# Patient Record
Sex: Female | Born: 1965
Health system: Southern US, Community
[De-identification: ages and names within clinical notes are randomized; demographics above are authoritative.]

## PROBLEM LIST (undated history)

## (undated) DIAGNOSIS — G43909 Migraine, unspecified, not intractable, without status migrainosus: Secondary | ICD-10-CM

## (undated) DIAGNOSIS — R519 Headache, unspecified: Secondary | ICD-10-CM

## (undated) DIAGNOSIS — Z720 Tobacco use: Secondary | ICD-10-CM

## (undated) DIAGNOSIS — R0600 Dyspnea, unspecified: Secondary | ICD-10-CM

## (undated) DIAGNOSIS — Z87442 Personal history of urinary calculi: Secondary | ICD-10-CM

## (undated) DIAGNOSIS — I209 Angina pectoris, unspecified: Secondary | ICD-10-CM

## (undated) DIAGNOSIS — F419 Anxiety disorder, unspecified: Secondary | ICD-10-CM

## (undated) DIAGNOSIS — R51 Headache: Secondary | ICD-10-CM

## (undated) DIAGNOSIS — E785 Hyperlipidemia, unspecified: Secondary | ICD-10-CM

## (undated) DIAGNOSIS — Z9289 Personal history of other medical treatment: Secondary | ICD-10-CM

## (undated) DIAGNOSIS — I214 Non-ST elevation (NSTEMI) myocardial infarction: Secondary | ICD-10-CM

## (undated) DIAGNOSIS — I2510 Atherosclerotic heart disease of native coronary artery without angina pectoris: Secondary | ICD-10-CM

## (undated) HISTORY — PX: DILATION AND CURETTAGE OF UTERUS: SHX78

## (undated) HISTORY — PX: TUBAL LIGATION: SHX77

## (undated) HISTORY — PX: ECTOPIC PREGNANCY SURGERY: SHX613

## (undated) HISTORY — PX: APPENDECTOMY: SHX54

## (undated) HISTORY — PX: INNER EAR SURGERY: SHX679

## (undated) HISTORY — PX: TONSILLECTOMY: SUR1361

---

## 1987-12-27 DIAGNOSIS — Z9289 Personal history of other medical treatment: Secondary | ICD-10-CM

## 1987-12-27 HISTORY — DX: Personal history of other medical treatment: Z92.89

## 2007-07-30 ENCOUNTER — Emergency Department (HOSPITAL_COMMUNITY): Admission: EM | Admit: 2007-07-30 | Discharge: 2007-07-31 | Payer: Self-pay | Admitting: Emergency Medicine

## 2007-08-31 ENCOUNTER — Emergency Department (HOSPITAL_COMMUNITY): Admission: EM | Admit: 2007-08-31 | Discharge: 2007-08-31 | Payer: Self-pay | Admitting: Emergency Medicine

## 2008-01-05 ENCOUNTER — Emergency Department (HOSPITAL_COMMUNITY): Admission: EM | Admit: 2008-01-05 | Discharge: 2008-01-05 | Payer: Self-pay | Admitting: Emergency Medicine

## 2008-05-24 ENCOUNTER — Emergency Department (HOSPITAL_COMMUNITY): Admission: EM | Admit: 2008-05-24 | Discharge: 2008-05-24 | Payer: Self-pay | Admitting: Emergency Medicine

## 2009-02-25 DIAGNOSIS — Z87442 Personal history of urinary calculi: Secondary | ICD-10-CM

## 2009-02-25 HISTORY — DX: Personal history of urinary calculi: Z87.442

## 2009-03-06 ENCOUNTER — Emergency Department (HOSPITAL_COMMUNITY): Admission: EM | Admit: 2009-03-06 | Discharge: 2009-03-06 | Payer: Self-pay | Admitting: Emergency Medicine

## 2009-05-15 ENCOUNTER — Emergency Department (HOSPITAL_COMMUNITY): Admission: EM | Admit: 2009-05-15 | Discharge: 2009-05-15 | Payer: Self-pay | Admitting: Emergency Medicine

## 2009-10-26 ENCOUNTER — Emergency Department (HOSPITAL_COMMUNITY): Admission: EM | Admit: 2009-10-26 | Discharge: 2009-10-26 | Payer: Self-pay | Admitting: Emergency Medicine

## 2010-05-13 LAB — DIFFERENTIAL
Basophils Relative: 1 % (ref 0–1)
Lymphs Abs: 2.1 10*3/uL (ref 0.7–4.0)
Monocytes Absolute: 0.6 10*3/uL (ref 0.1–1.0)
Neutrophils Relative %: 57 % (ref 43–77)

## 2010-05-13 LAB — COMPREHENSIVE METABOLIC PANEL
Calcium: 9 mg/dL (ref 8.4–10.5)
Chloride: 105 mEq/L (ref 96–112)
Creatinine, Ser: 0.73 mg/dL (ref 0.4–1.2)
GFR calc non Af Amer: >60 mL/min (ref >60)
Potassium: 3 mEq/L — ABNORMAL LOW (ref 3.5–5.1)
Sodium: 136 mEq/L (ref 135–145)

## 2010-05-13 LAB — CBC
HCT: 43.8 % (ref 36.0–46.0)
Hemoglobin: 14.7 g/dL (ref 12.0–15.0)
MCHC: 33.5 g/dL (ref 30.0–36.0)
RDW: 14 % (ref 11.5–15.5)
WBC: 6.7 10*3/uL (ref 4.0–10.5)

## 2010-05-13 LAB — POCT PREGNANCY, URINE: Preg Test, Ur: NEGATIVE

## 2010-06-07 LAB — URINALYSIS, ROUTINE W REFLEX MICROSCOPIC
Bilirubin Urine: NEGATIVE
Hgb urine dipstick: NEGATIVE
Nitrite: NEGATIVE

## 2010-06-07 LAB — URINE CULTURE

## 2010-06-07 LAB — ACETAMINOPHEN LEVEL: Acetaminophen (Tylenol), Serum: <10 ug/mL — ABNORMAL LOW (ref 10–30)

## 2010-06-07 LAB — RAPID URINE DRUG SCREEN, HOSP PERFORMED
Amphetamines: NOT DETECTED
Barbiturates: NOT DETECTED
Tetrahydrocannabinol: POSITIVE — AB

## 2010-06-07 LAB — PREGNANCY, URINE: Preg Test, Ur: NEGATIVE

## 2010-11-22 LAB — DIFFERENTIAL
Eosinophils Absolute: 0.1
Eosinophils Relative: 1
Lymphocytes Relative: 17
Monocytes Relative: 7
Neutro Abs: 8.5 — ABNORMAL HIGH
Neutrophils Relative %: 74

## 2010-11-22 LAB — URINE MICROSCOPIC-ADD ON

## 2010-11-22 LAB — COMPREHENSIVE METABOLIC PANEL
CO2: 20
Calcium: 8.7
Creatinine, Ser: 0.87
GFR calc Af Amer: >60
Total Bilirubin: 0.5

## 2010-11-22 LAB — URINALYSIS, ROUTINE W REFLEX MICROSCOPIC
Protein, ur: NEGATIVE
Specific Gravity, Urine: 1.014
Urobilinogen, UA: 1

## 2010-11-22 LAB — CBC
HCT: 45
Platelets: 262

## 2010-11-22 LAB — LIPASE, BLOOD: Lipase: 21

## 2013-06-13 ENCOUNTER — Emergency Department (HOSPITAL_COMMUNITY)
Admission: EM | Admit: 2013-06-13 | Discharge: 2013-06-13 | Disposition: A | Discharge status: Home / Self Care | Payer: Self-pay | Attending: Emergency Medicine | Admitting: Emergency Medicine

## 2013-06-13 ENCOUNTER — Encounter (HOSPITAL_COMMUNITY): Payer: Self-pay | Admitting: Emergency Medicine

## 2013-06-13 DIAGNOSIS — Y929 Unspecified place or not applicable: Secondary | ICD-10-CM | POA: Insufficient documentation

## 2013-06-13 DIAGNOSIS — S30860A Insect bite (nonvenomous) of lower back and pelvis, initial encounter: Secondary | ICD-10-CM | POA: Insufficient documentation

## 2013-06-13 DIAGNOSIS — H00019 Hordeolum externum unspecified eye, unspecified eyelid: Secondary | ICD-10-CM | POA: Insufficient documentation

## 2013-06-13 DIAGNOSIS — Y939 Activity, unspecified: Secondary | ICD-10-CM | POA: Insufficient documentation

## 2013-06-13 DIAGNOSIS — W57XXXA Bitten or stung by nonvenomous insect and other nonvenomous arthropods, initial encounter: Secondary | ICD-10-CM | POA: Insufficient documentation

## 2013-06-13 NOTE — Discharge Instructions (Signed)
Sty A sty (hordeolum) is an infection of a gland in the eyelid located at the base of the eyelash. A sty may develop a white or yellow head of pus. It can be puffy (swollen). Usually, the sty will burst and pus will come out on its own. They do not leave lumps in the eyelid once they drain. A sty is often confused with another form of cyst of the eyelid called a chalazion. Chalazions occur within the eyelid and not on the edge where the bases of the eyelashes are. They often are red, sore and then form firm lumps in the eyelid. CAUSES   Germs (bacteria).  Lasting (chronic) eyelid inflammation. SYMPTOMS   Tenderness, redness and swelling along the edge of the eyelid at the base of the eyelashes.  Sometimes, there is a white or yellow head of pus. It may or may not drain. DIAGNOSIS  An ophthalmologist will be able to distinguish between a sty and a chalazion and treat the condition appropriately.  TREATMENT   Styes are typically treated with warm packs (compresses) until drainage occurs.  In rare cases, medicines that kill germs (antibiotics) may be prescribed. These antibiotics may be in the form of drops, cream or pills.  If a hard lump has formed, it is generally necessary to do a small incision and remove the hardened contents of the cyst in a minor surgical procedure done in the office.  In suspicious cases, your caregiver may send the contents of the cyst to the lab to be certain that it is not a rare, but dangerous form of cancer of the glands of the eyelid. HOME CARE INSTRUCTIONS   Wash your hands often and dry them with a clean towel. Avoid touching your eyelid. This may spread the infection to other parts of the eye.  Apply heat to your eyelid for 10 to 20 minutes, several times a day, to ease pain and help to heal it faster.  Do not squeeze the sty. Allow it to drain on its own. Wash your eyelid carefully 3 to 4 times per day to remove any pus. SEEK IMMEDIATE MEDICAL CARE IF:    Your eye becomes painful or puffy (swollen).  Your vision changes.  Your sty does not drain by itself within 3 days.  Your sty comes back within a short period of time, even with treatment.  You have redness (inflammation) around the eye.  You have a fever. Document Released: 11/21/2004 Document Revised: 05/06/2011 Document Reviewed: 07/26/2008 [employer] Patient Information 2014 [employer].  

## 2013-06-13 NOTE — ED Provider Notes (Signed)
Medical screening examination/treatment/procedure(s) were performed by non-physician practitioner and as supervising physician I was immediately available for consultation/collaboration.   EKG Interpretation None        Tanna Furry, MD 06/13/13 [phone]

## 2013-06-13 NOTE — ED Provider Notes (Signed)
CSN: [id number]     Arrival date & time 06/13/13  1652 History   This chart was scribed for non-physician practitioner Glendell Docker, Terry, working with Tanna Furry, MD, by Neta Ehlers, ED Scribe. This patient was seen in room WTR5/WTR5 and the patient's care was started at 5:12 PM. First MD Initiated Contact with Patient 06/13/13 1702     Chief Complaint  Patient presents with  . Stye    The history is provided by the patient. No language interpreter was used.    HPI Comments: Rhonda Brown is a 48 y.o. female who presents to the Emergency Department complaining of a worsening stye to her right eyelid which she first noticed this morning. The pt rates the pain as 10/10. She has used three Zyrtec and cold compresses without response. The pt also reports she removed a tick from her back this morning.   History reviewed. No pertinent past medical history. Past Surgical History  Procedure Laterality Date  . Tonsillectomy    . Tubal ligation    . Appendectomy     History reviewed. No pertinent family history. History  Substance Use Topics  . Smoking status: Never Smoker   . Smokeless tobacco: Not on file  . Alcohol Use: No   No OB history provided.   Review of Systems  Eyes:       Stye to right eye with associated pain.   All other systems reviewed and are negative.  Allergies  Review of patient's allergies indicates not on file.  Home Medications   Prior to Admission medications   Not on File   Triage Vitals: BP 140/100  Pulse 94  Temp(Src) 98.2 F (36.8 C) (Oral)  Resp 16  SpO2 100%  Physical Exam  Nursing note and vitals reviewed. Constitutional: She is oriented to person, place, and time. She appears well-developed and well-nourished. No distress.  HENT:  Head: Normocephalic and atraumatic.  Eyes: Conjunctivae and EOM are normal. Pupils are equal, round, and reactive to light.  Redness and swelling noted to the upper lid.   Neck: Neck supple. No tracheal  deviation present.  Cardiovascular: Normal rate.   Pulmonary/Chest: Effort normal. No respiratory distress.  Musculoskeletal: Normal range of motion.  Neurological: She is alert and oriented to person, place, and time.  Skin: Skin is warm and dry.  Psychiatric: She has a normal mood and affect. Her behavior is normal.    ED Course  Procedures (including critical care time)  DIAGNOSTIC STUDIES: Oxygen Saturation is 100% on room air, normal by my interpretation.    COORDINATION OF CARE:  5:14 PM- Discussed treatment plan with patient, and the patient agreed to the plan. The plan includes the use of warm compresses.   Labs Review Labs Reviewed - No data to display  Imaging Review No results found.   EKG Interpretation None      MDM   Final diagnoses:  Stye    Discussed symptomatic treatment at home  I personally performed the services described in this documentation, which was scribed in my presence. The recorded information has been reviewed and is accurate.     Glendell Docker, NP 06/13/13 1717

## 2013-06-13 NOTE — ED Notes (Signed)
Pt c/o swelling to rt eyelid since this morning. Found tick on her back this morning also.

## 2014-02-11 DIAGNOSIS — F1290 Cannabis use, unspecified, uncomplicated: Secondary | ICD-10-CM | POA: Insufficient documentation

## 2014-02-21 ENCOUNTER — Other Ambulatory Visit: Payer: Self-pay

## 2014-02-21 DIAGNOSIS — Z1231 Encounter for screening mammogram for malignant neoplasm of breast: Secondary | ICD-10-CM

## 2014-03-04 ENCOUNTER — Ambulatory Visit: Payer: Self-pay

## 2014-03-04 ENCOUNTER — Ambulatory Visit
Admission: RE | Admit: 2014-03-04 | Discharge: 2014-03-04 | Disposition: A | Discharge status: Home / Self Care | Payer: PRIVATE HEALTH INSURANCE | Source: Ambulatory Visit

## 2014-03-04 DIAGNOSIS — Z1231 Encounter for screening mammogram for malignant neoplasm of breast: Secondary | ICD-10-CM

## 2014-12-25 ENCOUNTER — Encounter (HOSPITAL_COMMUNITY): Payer: Self-pay | Admitting: *Deleted

## 2014-12-25 ENCOUNTER — Emergency Department (HOSPITAL_COMMUNITY)
Admission: EM | Admit: 2014-12-25 | Discharge: 2014-12-25 | Disposition: A | Discharge status: Home / Self Care | Payer: PRIVATE HEALTH INSURANCE | Attending: Emergency Medicine | Admitting: Emergency Medicine

## 2014-12-25 DIAGNOSIS — G8929 Other chronic pain: Secondary | ICD-10-CM | POA: Insufficient documentation

## 2014-12-25 DIAGNOSIS — K5900 Constipation, unspecified: Secondary | ICD-10-CM | POA: Insufficient documentation

## 2014-12-25 DIAGNOSIS — R202 Paresthesia of skin: Secondary | ICD-10-CM | POA: Insufficient documentation

## 2014-12-25 DIAGNOSIS — M545 Low back pain: Secondary | ICD-10-CM | POA: Insufficient documentation

## 2014-12-25 DIAGNOSIS — M25511 Pain in right shoulder: Secondary | ICD-10-CM | POA: Insufficient documentation

## 2014-12-25 DIAGNOSIS — R200 Anesthesia of skin: Secondary | ICD-10-CM | POA: Insufficient documentation

## 2014-12-25 MED ORDER — MELOXICAM 7.5 MG PO TABS: 7.5000 mg | ORAL_TABLET | Freq: Every day | ORAL | Status: DC

## 2014-12-25 NOTE — ED Notes (Addendum)
Pt with hx of chronic back pain states woke up with R arm pain and numbness.  Pain increases with movement.  Pt is concerned b/c smoking pot is not relieving the pain.

## 2014-12-25 NOTE — Discharge Instructions (Signed)
Read the information below.  Use the prescribed medication as directed.  Please discuss all new medications with your pharmacist.  You may return to the Emergency Department at any time for worsening condition or any new symptoms that concern you.   If there is any possibility that you might be pregnant, please let your health care provider know and discuss this with the pharmacist to ensure medication safety.   If you develop fevers, loss of control of bowel or bladder, weakness or numbness in your legs, or are unable to walk, return to the ER for a recheck. If you develop uncontrolled pain, weakness or numbness of the extremity, severe discoloration of the skin, or you are unable to move your arm, return to the ER for a recheck.      Shoulder Pain The shoulder is the joint that connects your arms to your body. The bones that form the shoulder joint include the upper arm bone (humerus), the shoulder blade (scapula), and the collarbone (clavicle). The top of the humerus is shaped like a ball and fits into a rather flat socket on the scapula (glenoid cavity). A combination of muscles and strong, fibrous tissues that connect muscles to bones (tendons) support your shoulder joint and hold the ball in the socket. Small, fluid-filled sacs (bursae) are located in different areas of the joint. They act as cushions between the bones and the overlying soft tissues and help reduce friction between the gliding tendons and the bone as you move your arm. Your shoulder joint allows a wide range of motion in your arm. This range of motion allows you to do things like scratch your back or throw a ball. However, this range of motion also makes your shoulder more prone to pain from overuse and injury. Causes of shoulder pain can originate from both injury and overuse and usually can be grouped in the following four categories: 1. Redness, swelling, and pain (inflammation) of the tendon (tendinitis) or the bursae  (bursitis). 2. Instability, such as a dislocation of the joint. 3. Inflammation of the joint (arthritis). 4. Broken bone (fracture). HOME CARE INSTRUCTIONS  1. Apply ice to the sore area.  Put ice in a plastic bag.  Place a towel between your skin and the bag.  Leave the ice on for 15-20 minutes, 3-4 times per day for the first 2 days, or as directed by your health care provider. 2. Stop using cold packs if they do not help with the pain. 3. If you have a shoulder sling or immobilizer, wear it as long as your caregiver instructs. Only remove it to shower or bathe. Move your arm as little as possible, but keep your hand moving to prevent swelling. 4. Squeeze a soft ball or foam pad as much as possible to help prevent swelling. 5. Only take over-the-counter or prescription medicines for pain, discomfort, or fever as directed by your caregiver. SEEK MEDICAL CARE IF:  1. Your shoulder pain increases, or new pain develops in your arm, hand, or fingers. 2. Your hand or fingers become cold and numb. 3. Your pain is not relieved with medicines. SEEK IMMEDIATE MEDICAL CARE IF:  1. Your arm, hand, or fingers are numb or tingling. 2. Your arm, hand, or fingers are significantly swollen or turn white or blue. MAKE SURE YOU:  1. Understand these instructions. 2. Will watch your condition. 3. Will get help right away if you are not doing well or get worse.   This information is not intended  to replace advice given to you by your health care provider. Make sure you discuss any questions you have with your health care provider.   Document Released: 11/21/2004 Document Revised: 03/04/2014 Document Reviewed: 06/06/2014 Elsevier Interactive Patient Education 2016 Elsevier Inc.  Shoulder Range of Motion Exercises Shoulder range of motion (ROM) exercises are designed to keep the shoulder moving freely. They are often recommended for people who have shoulder pain. MOVEMENT EXERCISE When you are able,  do this exercise 5-6 days per week, or as told by your health care provider. Work toward doing 2 sets of 10 swings. Pendulum Exercise How To Do This Exercise Lying Down 5. Lie face-down on a bed with your abdomen close to the side of the bed. 6. Let your arm hang over the side of the bed. 7. Relax your shoulder, arm, and hand. 8. Slowly and gently swing your arm forward and back. Do not use your neck muscles to swing your arm. They should be relaxed. If you are struggling to swing your arm, have someone gently swing it for you. When you do this exercise for the first time, swing your arm at a 15 degree angle for 15 seconds, or swing your arm 10 times. As pain lessens over time, increase the angle of the swing to 30-45 degrees. 9. Repeat steps 1-4 with the other arm. How To Do This Exercise While Standing 6. Stand next to a sturdy chair or table and hold on to it with your hand.  Bend forward at the waist.  Bend your knees slightly.  Relax your other arm and let it hang limp.  Relax the shoulder blade of the arm that is hanging and let it drop.  While keeping your shoulder relaxed, use body motion to swing your arm in small circles. The first time you do this exercise, swing your arm for about 30 seconds or 10 times. When you do it next time, swing your arm for a little longer.  Stand up tall and relax.  Repeat steps 1-7, this time changing the direction of the circles. 7. Repeat steps 1-8 with the other arm. STRETCHING EXERCISES Do these exercises 3-4 times per day on 5-6 days per week or as told by your health care provider. Work toward holding the stretch for 20 seconds. Stretching Exercise 1 4. Lift your arm straight out in front of you. 5. Bend your arm 90 degrees at the elbow (right angle) so your forearm goes across your body and looks like the letter "L." 6. Use your other arm to gently pull the elbow forward and across your body. 7. Repeat steps 1-3 with the other  arm. Stretching Exercise 2 You will need a towel or rope for this exercise. 3. Bend one arm behind your back with the palm facing outward. 4. Hold a towel with your other hand. 5. Reach the arm that holds the towel above your head, and bend that arm at the elbow. Your wrist should be behind your neck. 6. Use your free hand to grab the free end of the towel. 7. With the higher hand, gently pull the towel up behind you. 8. With the lower hand, pull the towel down behind you. 9. Repeat steps 1-6 with the other arm. STRENGTHENING EXERCISES Do each of these exercises at four different times of day (sessions) every day or as told by your health care provider. To begin with, repeat each exercise 5 times (repetitions). Work toward doing 3 sets of 12 repetitions or as  told by your health care provider. Strengthening Exercise 1 You will need a light weight for this activity. As you grow stronger, you may use a heavier weight. 4. Standing with a weight in your hand, lift your arm straight out to the side until it is at the same height as your shoulder. 5. Bend your arm at 90 degrees so that your fingers are pointing to the ceiling. 6. Slowly raise your hand until your arm is straight up in the air. 7. Repeat steps 1-3 with the other arm. Strengthening Exercise 2 You will need a light weight for this activity. As you grow stronger, you may use a heavier weight. 1. Standing with a weight in your hand, gradually move your straight arm in an arc, starting at your side, then out in front of you, then straight up over your head. 2. Gradually move your other arm in an arc, starting at your side, then out in front of you, then straight up over your head. 3. Repeat steps 1-2 with the other arm. Strengthening Exercise 3 You will need an elastic band for this activity. As you grow stronger, gradually increase the size of the bands or increase the number of bands that you use at one time. 1. While standing, hold  an elastic band in one hand and raise that arm up in the air. 2. With your other hand, pull down the band until that hand is by your side. 3. Repeat steps 1-2 with the other arm.   This information is not intended to replace advice given to you by your health care provider. Make sure you discuss any questions you have with your health care provider.   Document Released: 11/10/2002 Document Revised: 06/28/2014 Document Reviewed: 02/07/2014 Elsevier Interactive Patient Education 2016 [employer].    Emergency Department Resource Guide 1) Find a Doctor and Pay Out of Pocket Although you won't have to find out who is covered by your insurance plan, it is a good idea to ask around and get recommendations. You will then need to call the office and see if the doctor you have chosen will accept you as a new patient and what types of options they offer for patients who are self-pay. Some doctors offer discounts or will set up payment plans for their patients who do not have insurance, but you will need to ask so you aren't surprised when you get to your appointment.  2) Contact Your Local Health Department Not all health departments have doctors that can see patients for sick visits, but many do, so it is worth a call to see if yours does. If you don't know where your local health department is, you can check in your phone book. The CDC also has a tool to help you locate your state's health department, and many state websites also have listings of all of their local health departments.  3) Find a [facility] If your illness is not likely to be very severe or complicated, you may want to try a walk in clinic. These are popping up all over the country in pharmacies, drugstores, and shopping centers. They're usually staffed by nurse practitioners or physician assistants that have been trained to treat common illnesses and complaints. They're usually fairly quick and inexpensive. However, if you have  serious medical issues or chronic medical problems, these are probably not your best option.  No Primary Care Doctor: - Call Health Connect at  [phone] - they can help you locate a primary  care doctor that  accepts your insurance, provides certain services, etc. - Physician Referral Service- [phone]  Chronic Pain Problems: Organization         Address  Phone   Notes  [facility]  [phone] Patients need to be referred by their primary care doctor.   Medication Assistance: Organization         Address  Phone   Notes  Tanina Barb [facility] Medication [facility] [address] [place], [place] [address] [phone] --Must be a resident of [facility] -- Must have NO insurance coverage whatsoever (no Medicaid/ Medicare, etc.) -- The pt. MUST have a primary care doctor that directs their care regularly and follows them in the community   MedAssist  [phone]   [employer]  [phone]    Agencies that provide inexpensive medical care: Organization         Address  Phone   Notes  [place]  [phone]   [facility]    [phone]   [facility] [place], [place] [address] [phone]   [place] [address], [place] [phone]   Planned Parenthood    [phone]   [facility]    [phone]   [place] and [address], [place] Phone:  [phone], Fax:  [phone] Hours of Operation:  9 am - 6 pm, M-F.  Also accepts Medicaid/Medicare and self-pay.  [facility] for [place], [address], [place] Phone: [phone], Fax: [phone]. Hours of Operation:  8:30 am - 5:30 pm, M-F.  Also accepts Medicaid and self-pay.  [facility] [place] [address] [place] Phone: [phone]   [place],  [place], [place] [phone], Ext. [phone] Mondays & Thursdays: 7-9 AM.  First 15 patients are seen on a first come, first serve basis.    [place] Providers:  Organization         Address  Phone   Notes  [facility] [address], [place] [phone] Also accepts self-pay patients.  [facility] [address], [place]  [phone]   [place], [address], [place] [phone]   [facility] [address] [place] [phone]   Lucianne Lei [address], [place]   [phone] Only accepts [place] Access [place] patients after they have their name applied to their card.   Self-Pay (no insurance) in [facility]:  Organization         Address  Phone   Notes  Sickle Cell Patients, [facility] [place] [phone]   [facility] Care [place] [phone]   [facility] [place]  [place], [place], [place] [phone]   [facility]/Dr. Osei-Bonsu  [address], [place] or [address], [place] [phone] Phone number for both [place] and [place] locations is the same.  [facility] and [facility] [address] [place] [phone]   [facility] [address], [place] or [address] [phone]  [facility] [phone], phone; [phone], fax Sees patients 1st and 3rd Saturday of every month.  Must not qualify for public or private insurance (i.e. Medicaid, Medicare, [place] Health Choice, Veterans' Benefits)  Household income should be no more than 200% of the poverty level The clinic cannot treat you if you are pregnant or think you are pregnant   Sexually transmitted diseases are not treated at the clinic.    Dental Care: Organization         Address  Phone  Notes  [facility] Department of [facility] [phone] Accepts children up to age 81 who are enrolled in [place] or [place]; pregnant women with a Medicaid card; and children who have applied for Medicaid or [place] Health Choice, but were declined, whose parents can pay a reduced fee at time of service.  [facility] Department of [facility]  [address], [place] [phone] Accepts children up to age 63 who are enrolled in [place] or [place]; pregnant women with a Medicaid card; and children who have applied for Medicaid or [place] Health Choice, but were declined, whose parents can pay a reduced fee at time of service.  [place] Adult Dental Access PROGRAM  [place] [phone] Patients are seen by appointment only. Walk-ins are not accepted. [place] will see patients 37 years of age and older. Monday - Tuesday (8am-5pm) Most Wednesdays (8:30-5pm) $30 per visit, cash only  [facility] Adult Dental Access PROGRAM  [address], [facility] [phone] Patients are seen by appointment only. Walk-ins are not accepted. [place] will see patients 17 years of age and older. One Wednesday Evening (Monthly: Volunteer Based).  $30 per visit, cash only  [place]  [phone] for adults; Children under age 63, call Graduate Pediatric Dentistry at [phone]. Children aged 41-14, please call [phone] to request a pediatric application.  Dental services are provided in all areas of dental care including fillings, crowns and bridges, complete and partial dentures, implants, gum treatment, root canals, and extractions. Preventive care is also provided. Treatment is provided to both adults and children. Patients  are selected via a lottery and there is often a waiting list.   [facility] [address] [place]  [phone] [email or web address]   [facility] [address] [place], [place] [phone], Ext. [phone] Second and Fourth Thursday of each month, opens at 6:30 AM; Clinic ends at 9 AM.  Patients are seen on a first-come first-served basis, and a limited number are seen during each clinic.   [facility]  [address] Hillard Danker [place], [place] [phone]   Eligibility Requirements You must have lived in [place], [place], or [place] counties for at least the last three months.   You cannot be eligible for state or federal sponsored [employer], including [employer], [place], or [employer].   You generally cannot be eligible for healthcare insurance through your employer.    How to apply: Eligibility screenings are held every Tuesday and Wednesday afternoon from 1:00 pm until 4:00 pm. You do not need an appointment for the interview!  [facility] [address], [place], [place]   [place]  [place] Department  [place]  Department  [phone]    Behavioral Health Resources in the Community: Intensive Outpatient Programs Organization         Address  Phone  Notes  [place]. [address] [place], [place] [phone]   [facility] Outpatient [address], [place], [place]   ADS: Alcohol & Drug Svcs [address], [place], [place]   [place] [address]  [place], [place] or [phone]   Substance Abuse Resources Organization         Address  Phone  Notes  Alcohol and Drug Services  [phone]   [place]  [phone]   [facility] [place]   Chinita Pester  [phone]    Residential & Outpatient Substance Abuse Program  [phone]   Psychological Services Organization         Address  Phone  Notes  [facility] [place]  [place]  [phone]   [place] [address] [place] or [phone]    Mobile Crisis Teams Organization         Address  Phone  Notes  [facility]atives, Mobile Crisis Care Unit  [phone]   Assertive Psychotherapeutic Services  [address] [place], [place]   [place] [address], [place] [phone]    Self-Help/Support Groups Organization         Address  Phone             Notes  [place]. of [place] - variety of support groups  [place] Call for more information  Narcotics Anonymous (NA), Caring Services [address], [employer] [place]  2 meetings at this location   Special educational needs teacher         Address  Phone  Notes  ASAP Residential Treatment [place],    [place]  [phone]   [facility]  [address], [place] [address], [place], [place]   [place], [place] [phone] Admissions: 8am-3pm M-F  Incentives Substance [place] [address],    [place], [place] [phone]   [facility] [address] [place], [place], [place]   [facility] [address],  [place], [place]   Insight Programs - Intensive Outpatient [address], [place], [place]   [facility] ([place].,  [place], [place] [phone] or [phone]   [facility]idential Treatment Services (RTS) [address] [place], [place] Accepts Medicaid  [facility] [place] [address],  [place] [phone] Substance Abuse/Addiction Treatment   [facility] Organization         Address  Phone  Notes  [facility]  [phone]   Domenic Schwab, PhD [address] Arlis Port[place], [place]   [phone] or [phone]   [place] [facility], [place] [phone]   [place] [address] [place], [place] [phone] Insurance/Medicaid/sponsorship through [employer] and Families [address], [place]                                    [place], [place] [phone] Therapy/tele-psych/case  Youth  [facility] [place], [place] [phone]    Dr. Adele Schilder  [phone]   [facility] [place] Dept. 1) [address], [place] 2) [place] 3)  [place] [address], [place] [phone]  [phone]   [place] [phone] or [phone] (After Hours)

## 2014-12-25 NOTE — ED Notes (Signed)
Declined W/C at D/C and was escorted to lobby by RN. 

## 2014-12-25 NOTE — ED Provider Notes (Signed)
CSN: [id number]     Arrival date & time 12/25/14  1111 History  By signing my name below, I, Clement Sayres, attest that this documentation has been prepared under the direction and in the presence of Darald Uzzle, PA-C.  Electronically Signed: Clement Sayres, ED Scribe. 12/25/2014. 12:56 PM.    Chief Complaint  Patient presents with  . Arm Pain   The history is provided by the patient. No language interpreter was used.   HPI Comments: Rhonda Brown is a 49 y.o. female who presents to the Emergency Department complaining of constant, moderate right shoulder pain that began yesterday and has gradually been worsening.  She states she helped her friend do a significant amount of cleaning yesterday.  Pt also reports tingling and paresthesia in all of the fingers of her right hand.  Patient denies any other injury or fall.   Pt denies fevers, neck pain, bowel/bladder incontinence, lower extremity numbness/weakness, abdominal pain, SOB, dysuria.  She also complains of slight increase in her chronic, non-radiating, bilateral lower back pain onset 2 days ago that is worse with movement.  She describes this episode as like a typical flare of her back pain that is relieved by laying supine, rest, and ibuprofen. She is able to ambulate normally.  She denies fall, injury.     History reviewed. No pertinent past medical history. Past Surgical History  Procedure Laterality Date  . Tonsillectomy    . Tubal ligation    . Appendectomy     No family history on file. Social History  Substance Use Topics  . Smoking status: Never Smoker   . Smokeless tobacco: None  . Alcohol Use: No   OB History    No data available     Review of Systems  Constitutional: Negative for fever.  Respiratory: Negative for shortness of breath.   Cardiovascular: Negative for chest pain.  Gastrointestinal: Positive for constipation. Negative for vomiting and abdominal pain.  Genitourinary: Negative for dysuria, urgency  and frequency.  Musculoskeletal: Positive for back pain and arthralgias. Negative for neck pain.  Skin: Negative for color change, rash and wound.  Allergic/Immunologic: Negative for immunocompromised state.  Neurological: Positive for numbness. Negative for weakness.  Psychiatric/Behavioral: Negative for self-injury.      Allergies  Review of patient's allergies indicates no known allergies.  Home Medications   Prior to Admission medications   Medication Sig Start Date End Date Taking? Authorizing Provider  meloxicam (MOBIC) 7.5 MG tablet Take 1 tablet (7.5 mg total) by mouth daily. 12/25/14   Clayton Bibles, PA-C   BP 134/88 mmHg  Pulse 80  Temp(Src) 97.6 F (36.4 C) (Oral)  Resp 16  Ht 5\' 2"  (1.575 m)  Wt 173 lb 6.4 oz (78.654 kg)  BMI 31.71 kg/m2  SpO2 95% Physical Exam  Constitutional: She appears well-developed and well-nourished. No distress.  HENT:  Head: Normocephalic and atraumatic.  Neck: Neck supple.  Pulmonary/Chest: Effort normal.  Abdominal: Soft. She exhibits no distension and no mass. There is no tenderness. There is no rebound and no guarding.  Musculoskeletal:  Spine nontender, no crepitus, or stepoffs. Extremities:  Strength 5/5, sensation intact, distal pulses intact.     Some tenderness throughout lower back without focal tenderness or overlying skin changes. Right shoulder mild tenderness to the anterior shoulder full active ROM, passive ROM without significant pain Negative drop test No A/C joint tenderness  Neurological: She is alert.  Skin: She is not diaphoretic.  Nursing note and vitals reviewed.  ED Course  Procedures DIAGNOSTIC STUDIES: Oxygen Saturation is 100% on RA, normal by my interpretation.    12:56 PM COORDINATION OF CARE: Discussed treatment of referral and using a sling with pt. Pt agreed to plan.   Labs Review Labs Reviewed - No data to display  Imaging Review No results found. I have personally reviewed and evaluated  these images and lab results as part of my medical decision-making.   EKG Interpretation None      MDM   Final diagnoses:  Right shoulder pain    Afebrile, nontoxic patient with right shoulder pain after cleaning a friend's house of the weekend.  Neurovascularly intact.  Notes tingling in all fingers but this is not dermatomal.  No weakness.  No neck pain.  No red flags.  D/C home with mobic, PCP follow up, range of motion exercises.  Discussed result, findings, treatment, and follow up  with patient.  Pt given return precautions.  Pt verbalizes understanding and agrees with plan.         I personally performed the services described in this documentation, which was scribed in my presence. The recorded information has been reviewed and is accurate.   [place], PA-C 12/25/14 [place] Liu, MD 12/25/14 [phone]

## 2015-03-29 ENCOUNTER — Emergency Department (HOSPITAL_COMMUNITY)
Admission: EM | Admit: 2015-03-29 | Discharge: 2015-03-29 | Disposition: A | Discharge status: Home / Self Care | Payer: PRIVATE HEALTH INSURANCE | Attending: Emergency Medicine | Admitting: Emergency Medicine

## 2015-03-29 ENCOUNTER — Emergency Department (HOSPITAL_COMMUNITY): Payer: PRIVATE HEALTH INSURANCE

## 2015-03-29 ENCOUNTER — Encounter (HOSPITAL_COMMUNITY): Payer: Self-pay | Admitting: *Deleted

## 2015-03-29 DIAGNOSIS — L600 Ingrowing nail: Secondary | ICD-10-CM | POA: Insufficient documentation

## 2015-03-29 DIAGNOSIS — M79674 Pain in right toe(s): Secondary | ICD-10-CM | POA: Insufficient documentation

## 2015-03-29 DIAGNOSIS — Z791 Long term (current) use of non-steroidal anti-inflammatories (NSAID): Secondary | ICD-10-CM | POA: Insufficient documentation

## 2015-03-29 MED ORDER — NAPROXEN 250 MG PO TABS: 250.0000 mg | ORAL_TABLET | Freq: Two times a day (BID) | ORAL | Status: DC

## 2015-03-29 NOTE — ED Provider Notes (Signed)
CSN: [id number]     Arrival date & time 03/29/15  2054 History  By signing my name below, I, Rowan Blase, attest that this documentation has been prepared under the direction and in the presence of non-physician practitioner, Waynetta Pean, PA-C. Electronically Signed: Rowan Blase, Scribe. 03/29/2015. 9:46 PM.   Chief Complaint  Patient presents with  . Toe Pain   The history is provided by the patient. No language interpreter was used.   HPI Comments:  Maycee Macgregor is a 50 y.o. female who presents to the Emergency Department complaining of 7/10, non-radiating right big toe pain for the past month. Pt reports associated swelling and bruising under her toenail. She believes she had injury about 1 month ago, but is unsure.  Pt has attempted treatment with neosporin and epsom salt baths with mild relief. Pt notes she drinks ETOH on weekends and denies hx of gout. Pt denies fever, discharge, redness, weakness, numbness, or tingling.  History reviewed. No pertinent past medical history. Past Surgical History  Procedure Laterality Date  . Tonsillectomy    . Tubal ligation    . Appendectomy     No family history on file. Social History  Substance Use Topics  . Smoking status: Never Smoker   . Smokeless tobacco: None  . Alcohol Use: No   OB History    No data available     Review of Systems  Constitutional: Negative for fever.  Musculoskeletal: Positive for joint swelling and arthralgias.  Neurological: Negative for weakness and numbness.   Allergies  Review of patient's allergies indicates no known allergies.  Home Medications   Prior to Admission medications   Medication Sig Start Date End Date Taking? Authorizing Provider  meloxicam (MOBIC) 7.5 MG tablet Take 1 tablet (7.5 mg total) by mouth daily. 12/25/14   Clayton Bibles, PA-C  naproxen (NAPROSYN) 250 MG tablet Take 1 tablet (250 mg total) by mouth 2 (two) times daily with a meal. 03/29/15   Waynetta Pean, PA-C   BP  123/87 mmHg  Pulse 87  Temp(Src) 97.5 F (36.4 C) (Oral)  Resp 18  Ht 5\' 2"  (1.575 m)  Wt 74.844 kg  BMI 30.17 kg/m2  SpO2 98% Physical Exam  Constitutional: She appears well-developed and well-nourished. No distress.   Nontoxic appearing.  HENT:  Head: Normocephalic and atraumatic.  Eyes: Right eye exhibits no discharge. Left eye exhibits no discharge.  Cardiovascular: Normal rate, regular rhythm and intact distal pulses.   Pulses:      Dorsalis pedis pulses are 2+ on the right side, and 2+ on the left side.       Posterior tibial pulses are 2+ on the right side, and 2+ on the left side.   Good capillary refill to her right distal toes.  Pulmonary/Chest: Effort normal. No respiratory distress.  Musculoskeletal:  No calf edema, no knee tenderness; Mild edema and tenderness with movement of right big toe, no ecchymosis, no deformity  Neurological: She is alert. Coordination normal.  Skin: Skin is warm and dry. No rash noted. She is not diaphoretic. No erythema. No pallor.  Tenderness to her right great toe. She has ingrown nail without erythema. No evidence of a felon or paronychia.   Psychiatric: She has a normal mood and affect. Her behavior is normal.  Nursing note and vitals reviewed.   ED Course  Procedures DIAGNOSTIC STUDIES:  Oxygen Saturation is 98% on RA, normal by my interpretation.    COORDINATION OF CARE:  9:10 PM Will order  imaging of right great toe. Discussed treatment plan with pt at bedside and pt agreed to plan.  Labs Review Labs Reviewed - No data to display  Imaging Review Dg Toe Great Right  03/29/2015  CLINICAL DATA:  Right great toe pain and swelling for 1 month. EXAM: RIGHT GREAT TOE COMPARISON:  None. FINDINGS: No fracture or bony destructive findings to suggest osteomyelitis. No malalignment. No gas is evident in the soft tissues. Mild irregularity along the anterior process of the calcaneus in a pattern suspicious for fibrous calcaneonavicular  tarsal coalition. Plantar calcaneal spur. IMPRESSION: 1. No findings of osteomyelitis, fracture, or dislocation. No gas tracking in the soft tissues the great toe. 2. Suspicion for fibrous calcaneonavicular tarsal coalition. 3. Plantar calcaneal spur. Electronically Signed   By: Van Clines M.D.   On: 03/29/2015 21:37   I have personally reviewed and evaluated these images as part of my medical decision-making.   EKG Interpretation None      Filed Vitals:   03/29/15 2102  BP: 123/87  Pulse: 87  Temp: 97.5 F (36.4 C)  TempSrc: Oral  Resp: 18  Height: 5\' 2"  (1.575 m)  Weight: 74.844 kg  SpO2: 98%    MDM   Meds given in ED:  Medications - No data to display  New Prescriptions   NAPROXEN (NAPROSYN) 250 MG TABLET    Take 1 tablet (250 mg total) by mouth 2 (two) times daily with a meal.    Final diagnoses:  Great toe pain, right   This is a 50 y.o. female who presents to the Emergency Department complaining of 7/10, non-radiating right big toe pain for the past month. Pt reports associated swelling and bruising under her toenail. She believes she had injury about 1 month ago, but is unsure.  Pt has attempted treatment with neosporin and epsom salt baths with mild relief.   on exam the patient is afebrile and nontoxic appearing. The patient has tenderness diffusely throughout her right great toe. Mild edema. No erythema. She has ingrown nail without erythema. I doubt this is the cause of her pain is her pain is diffuse throughout her toe. No evidence of felon or paronychia.  X-ray is unremarkable of her great toe. We will discharge with Naprosyn and have her follow-up with podiatry and PCP. I advised the patient to follow-up with their primary care provider this week. I advised the patient to return to the emergency department with new or worsening symptoms or new concerns. The patient verbalized understanding and agreement with plan.    I personally performed the services  described in this documentation, which was scribed in my presence. The recorded information has been reviewed and is accurate.       Waynetta Pean, PA-C [phone]  Delora Fuel, MD [id number] [phone]

## 2015-03-29 NOTE — ED Notes (Signed)
Rt great toe pain for one month  She has blood under the toenail. .  No known injury

## 2015-03-29 NOTE — Discharge Instructions (Signed)
Ingrown Toenail  An ingrown toenail occurs when the corner or sides of your toenail grow into the surrounding skin. The big toe is most commonly affected, but it can happen to any of your toes. If your ingrown toenail is not treated, you will be at risk for infection.  CAUSES  This condition may be caused by:  · Wearing shoes that are too small or tight.  · Injury or trauma, such as stubbing your toe or having your toe stepped on.  · Improper cutting or care of your toenails.  · Being born with (congenital) nail or foot abnormalities, such as having a nail that is too big for your toe.  RISK FACTORS  Risk factors for an ingrown toenail include:  · Age. Your nails tend to thicken as you get older, so ingrown nails are more common in older people.  · Diabetes.  · Cutting your toenails incorrectly.  · Blood circulation problems.  SYMPTOMS  Symptoms may include:  · Pain, soreness, or tenderness.  · Redness.  · Swelling.  · Hardening of the skin surrounding the toe.  Your ingrown toenail may be infected if there is fluid, pus, or drainage.  DIAGNOSIS   An ingrown toenail may be diagnosed by medical history and physical exam. If your toenail is infected, your health care provider may test a sample of the drainage.  TREATMENT  Treatment depends on the severity of your ingrown toenail. Some ingrown toenails may be treated at home. More severe or infected ingrown toenails may require surgery to remove all or part of the nail. Infected ingrown toenails may also be treated with antibiotic medicines.  HOME CARE INSTRUCTIONS  · If you were prescribed an antibiotic medicine, finish all of it even if you start to feel better.  · Soak your foot in warm soapy water for 20 minutes, 3 times per day or as directed by your health care provider.  · Carefully lift the edge of the nail away from the sore skin by wedging a small piece of cotton under the corner of the nail. This may help with the pain.  Be careful not to cause more injury  to the area.  · Wear shoes that fit well. If your ingrown toenail is causing you pain, try wearing sandals, if possible.  · Trim your toenails regularly and carefully. Do not cut them in a curved shape. Cut your toenails straight across. This prevents injury to the skin at the corners of the toenail.  · Keep your feet clean and dry.  · If you are having trouble walking and are given crutches by your health care provider, use them as directed.  · Do not pick at your toenail or try to remove it yourself.  · Take medicines only as directed by your health care provider.  · Keep all follow-up visits as directed by your health care provider. This is important.  SEEK MEDICAL CARE IF:  · Your symptoms do not improve with treatment.  SEEK IMMEDIATE MEDICAL CARE IF:  · You have red streaks that start at your foot and go up your leg.  · You have a fever.  · You have increased redness, swelling, or pain.  · You have fluid, blood, or pus coming from your toenail.     This information is not intended to replace advice given to you by your health care provider. Make sure you discuss any questions you have with your health care provider.     Document Released:   02/09/2000 Document Revised: 06/28/2014 Document Reviewed: 01/05/2014  Elsevier Interactive Patient Education ©2016 [employer]

## 2015-05-24 ENCOUNTER — Other Ambulatory Visit: Payer: Self-pay | Admitting: Family

## 2015-05-24 DIAGNOSIS — Z1231 Encounter for screening mammogram for malignant neoplasm of breast: Secondary | ICD-10-CM

## 2015-05-31 ENCOUNTER — Ambulatory Visit: Payer: PRIVATE HEALTH INSURANCE

## 2015-06-20 ENCOUNTER — Ambulatory Visit
Admission: RE | Admit: 2015-06-20 | Discharge: 2015-06-20 | Disposition: A | Discharge status: Home / Self Care | Payer: PRIVATE HEALTH INSURANCE | Source: Ambulatory Visit | Attending: Family | Admitting: Family

## 2015-06-20 DIAGNOSIS — Z1231 Encounter for screening mammogram for malignant neoplasm of breast: Secondary | ICD-10-CM

## 2015-06-22 ENCOUNTER — Other Ambulatory Visit: Payer: Self-pay | Admitting: Family

## 2015-06-22 DIAGNOSIS — R928 Other abnormal and inconclusive findings on diagnostic imaging of breast: Secondary | ICD-10-CM

## 2015-06-28 ENCOUNTER — Ambulatory Visit
Admission: RE | Admit: 2015-06-28 | Discharge: 2015-06-28 | Disposition: A | Discharge status: Home / Self Care | Payer: PRIVATE HEALTH INSURANCE | Source: Ambulatory Visit | Attending: Family | Admitting: Family

## 2015-06-28 DIAGNOSIS — R928 Other abnormal and inconclusive findings on diagnostic imaging of breast: Secondary | ICD-10-CM

## 2015-07-03 ENCOUNTER — Encounter (HOSPITAL_COMMUNITY): Payer: Self-pay

## 2015-07-03 ENCOUNTER — Emergency Department (HOSPITAL_COMMUNITY)
Admission: EM | Admit: 2015-07-03 | Discharge: 2015-07-03 | Disposition: A | Discharge status: Home / Self Care | Payer: PRIVATE HEALTH INSURANCE | Attending: Emergency Medicine | Admitting: Emergency Medicine

## 2015-07-03 DIAGNOSIS — M546 Pain in thoracic spine: Secondary | ICD-10-CM | POA: Diagnosis present

## 2015-07-03 DIAGNOSIS — Z79899 Other long term (current) drug therapy: Secondary | ICD-10-CM | POA: Insufficient documentation

## 2015-07-03 LAB — URINALYSIS, ROUTINE W REFLEX MICROSCOPIC
Bilirubin Urine: NEGATIVE
GLUCOSE, UA: NEGATIVE mg/dL
HGB URINE DIPSTICK: NEGATIVE
Ketones, ur: NEGATIVE mg/dL
Leukocytes, UA: NEGATIVE
Nitrite: NEGATIVE
PROTEIN: NEGATIVE mg/dL
Specific Gravity, Urine: 1.028 (ref 1.005–1.030)
pH: 5.5 (ref 5.0–8.0)

## 2015-07-03 MED ORDER — DIAZEPAM 5 MG PO TABS
5.0000 mg | ORAL_TABLET | Freq: Once | ORAL | Status: AC
  Administered 2015-07-03: 5 mg via ORAL
  Filled 2015-07-03: qty 1

## 2015-07-03 MED ORDER — KETOROLAC TROMETHAMINE 60 MG/2ML IM SOLN
60.0000 mg | Freq: Once | INTRAMUSCULAR | Status: AC
  Administered 2015-07-03: 60 mg via INTRAMUSCULAR
  Filled 2015-07-03: qty 2

## 2015-07-03 MED ORDER — OXYCODONE HCL 5 MG PO TABS
5.0000 mg | ORAL_TABLET | Freq: Once | ORAL | Status: AC
  Administered 2015-07-03: 5 mg via ORAL
  Filled 2015-07-03: qty 1

## 2015-07-03 NOTE — Discharge Instructions (Signed)
Take 4 over the counter ibuprofen tablets 3 times a day or 2 over-the-counter naproxen tablets twice a day for pain. ° °Back Pain, Adult °Back pain is very common in adults. The cause of back pain is rarely dangerous and the pain often gets better over time. The cause of your back pain may not be known. Some common causes of back pain include: °· Strain of the muscles or ligaments supporting the spine. °· Wear and tear (degeneration) of the spinal disks. °· Arthritis. °· Direct injury to the back. °For many people, back pain may return. Since back pain is rarely dangerous, most people can learn to manage this condition on their own. °HOME CARE INSTRUCTIONS °Watch your back pain for any changes. The following actions may help to lessen any discomfort you are feeling: °· Remain active. It is stressful on your back to sit or stand in one place for long periods of time. Do not sit, drive, or stand in one place for more than 30 minutes at a time. Take short walks on even surfaces as soon as you are able. Try to increase the length of time you walk each day. °· Exercise regularly as directed by your health care provider. Exercise helps your back heal faster. It also helps avoid future injury by keeping your muscles strong and flexible. °· Do not stay in bed. Resting more than 1-2 days can delay your recovery. °· Pay attention to your body when you bend and lift. The most comfortable positions are those that put less stress on your recovering back. Always use proper lifting techniques, including: °¨ Bending your knees. °¨ Keeping the load close to your body. °¨ Avoiding twisting. °· Find a comfortable position to sleep. Use a firm mattress and lie on your side with your knees slightly bent. If you lie on your back, put a pillow under your knees. °· Avoid feeling anxious or stressed. Stress increases muscle tension and can worsen back pain. It is important to recognize when you are anxious or stressed and learn ways to  manage it, such as with exercise. °· Take medicines only as directed by your health care provider. Over-the-counter medicines to reduce pain and inflammation are often the most helpful. Your health care provider may prescribe muscle relaxant drugs. These medicines help dull your pain so you can more quickly return to your normal activities and healthy exercise. °· Apply ice to the injured area: °¨ Put ice in a plastic bag. °¨ Place a towel between your skin and the bag. °¨ Leave the ice on for 20 minutes, 2-3 times a day for the first 2-3 days. After that, ice and heat may be alternated to reduce pain and spasms. °· Maintain a healthy weight. Excess weight puts extra stress on your back and makes it difficult to maintain good posture. °SEEK MEDICAL CARE IF: °· You have pain that is not relieved with rest or medicine. °· You have increasing pain going down into the legs or buttocks. °· You have pain that does not improve in one week. °· You have night pain. °· You lose weight. °· You have a fever or chills. °SEEK IMMEDIATE MEDICAL CARE IF:  °· You develop new bowel or bladder control problems. °· You have unusual weakness or numbness in your arms or legs. °· You develop nausea or vomiting. °· You develop abdominal pain. °· You feel faint. °  °This information is not intended to replace advice given to you by your health care provider. Make sure you discuss any questions you have   with your health care provider. °  °Document Released: 02/11/2005 Document Revised: 03/04/2014 Document Reviewed: 06/15/2013 °Elsevier Interactive Patient Education ©2016 [employer]. ° °

## 2015-07-03 NOTE — ED Provider Notes (Signed)
CSN: [id number]     Arrival date & time 07/03/15  0214 History  By signing my name below, I, Evelene Croon, attest that this documentation has been prepared under the direction and in the presence of Deno Etienne, DO . Electronically Signed: Evelene Croon, Scribe. 07/03/2015. 3:11 AM.    Chief Complaint  Patient presents with  . Back Pain    The history is provided by the patient. No language interpreter was used.     HPI Comments:  Rhonda Brown is a 50 y.o. female who presents to the Emergency Department complaining of gradually worsening, non-radiating, back pain which she woke up with today. She has applied Bengay and taken ibuprofen and flexeril with little relief. Pt reports h/o same pain. She denies recent injury, urinary symptoms, and bowel/bladder incontinence.    History reviewed. No pertinent past medical history. Past Surgical History  Procedure Laterality Date  . Tonsillectomy    . Tubal ligation    . Appendectomy     No family history on file. Social History  Substance Use Topics  . Smoking status: Never Smoker   . Smokeless tobacco: None  . Alcohol Use: No   OB History    No data available     Review of Systems  Constitutional: Negative for fever and chills.  HENT: Negative for congestion and rhinorrhea.   Eyes: Negative for redness and visual disturbance.  Respiratory: Negative for shortness of breath and wheezing.   Cardiovascular: Negative for chest pain and palpitations.  Gastrointestinal: Negative for nausea and vomiting.  Genitourinary: Negative for dysuria and urgency.  Musculoskeletal: Positive for back pain. Negative for myalgias and arthralgias.  Skin: Negative for pallor and wound.  Neurological: Negative for dizziness and headaches.   Allergies  Review of patient's allergies indicates no known allergies.  Home Medications   Prior to Admission medications   Medication Sig Start Date End Date Taking? Authorizing Provider  PRESCRIPTION  MEDICATION Take 1 capsule by mouth 2 (two) times daily.   Yes Historical Provider, MD  meloxicam (MOBIC) 7.5 MG tablet Take 1 tablet (7.5 mg total) by mouth daily. Patient not taking: Reported on 07/03/2015 12/25/14   Clayton Bibles, PA-C  naproxen (NAPROSYN) 250 MG tablet Take 1 tablet (250 mg total) by mouth 2 (two) times daily with a meal. Patient not taking: Reported on 07/03/2015 03/29/15   Waynetta Pean, PA-C   BP 106/75 mmHg  Pulse 72  Temp(Src) 97.4 F (36.3 C) (Oral)  Resp 20  Ht 5\' 2"  (1.575 m)  Wt 170 lb (77.111 kg)  BMI 31.09 kg/m2  SpO2 96% Physical Exam  Constitutional: She is oriented to person, place, and time. She appears well-developed and well-nourished. No distress.  HENT:  Head: Normocephalic and atraumatic.  Eyes: EOM are normal. Pupils are equal, round, and reactive to light.  Neck: Normal range of motion. Neck supple.  Cardiovascular: Normal rate and regular rhythm.  Exam reveals no gallop and no friction rub.   No murmur heard. Pulmonary/Chest: Effort normal. She has no wheezes. She has no rales.  Abdominal: Soft. She exhibits no distension. There is no tenderness.  Musculoskeletal: She exhibits no edema.  Lower thoracic pain; no point tenderness    Neurological: She is alert and oriented to person, place, and time.  Negative straight leg raise bilaterally  5/5 BLE strength   Skin: Skin is warm and dry. She is not diaphoretic.  Psychiatric: She has a normal mood and affect. Her behavior is normal.  Nursing note  and vitals reviewed.   ED Course  Procedures  DIAGNOSTIC STUDIES:  Oxygen Saturation is 98% on RA, normal by my interpretation.    COORDINATION OF CARE:  3:10 AM Discussed treatment plan with pt at bedside and pt agreed to plan.  Labs Review Labs Reviewed  URINALYSIS, ROUTINE W REFLEX MICROSCOPIC (NOT AT [facility])    Imaging Review No results found. I have personally reviewed and evaluated these images and lab results as part of my medical  decision-making.   EKG Interpretation None      MDM   Final diagnoses:  Bilateral thoracic back pain    50 yo F With a chief complaint of back pain. This been an off and on issue for this patient. Similar to her prior back pains. Denies any injury. Denies cauda equina symptoms. No leg weakness or numbness. Able to walk without difficulty. No fevers. Will treat with Toradol Roxi and Valium. Have her take NSAIDs at home. PCP follow-up.  I personally performed the services described in this documentation, which was scribed in my presence. The recorded information has been reviewed and is accurate.   I have discussed the diagnosis/risks/treatment options with the patient and family and believe the pt to be eligible for discharge home to follow-up with PCP. We also discussed returning to the ED immediately if new or worsening sx occur. We discussed the sx which are most concerning (e.g., sudden worsening pain, fever, inability to tolerate by mouth) that necessitate immediate return. Medications administered to the patient during their visit and any new prescriptions provided to the patient are listed below.  Medications given during this visit Medications  ketorolac (TORADOL) injection 60 mg (60 mg Intramuscular Given 07/03/15 0327)  oxyCODONE (Oxy IR/ROXICODONE) immediate release tablet 5 mg (5 mg Oral Given 07/03/15 0327)  diazepam (VALIUM) tablet 5 mg (5 mg Oral Given 07/03/15 0327)    Discharge Medication List as of 07/03/2015  3:18 AM      The patient appears reasonably screen and/or stabilized for discharge and I doubt any other medical condition or other [facility] requiring further screening, evaluation, or treatment in the ED at this time prior to discharge.     Deno Etienne, DO 07/03/15 0532

## 2015-07-03 NOTE — ED Notes (Signed)
Pt comes for c/o lower back bilaterally, denies urinary symptoms. Pt states that her back has been hurting all day and this pain happens every couple of months and she "throws her back out"

## 2015-07-04 DIAGNOSIS — M545 Low back pain: Secondary | ICD-10-CM

## 2015-07-04 DIAGNOSIS — G8929 Other chronic pain: Secondary | ICD-10-CM | POA: Insufficient documentation

## 2015-12-07 ENCOUNTER — Emergency Department (HOSPITAL_COMMUNITY): Payer: 59

## 2015-12-07 ENCOUNTER — Encounter (HOSPITAL_COMMUNITY): Payer: Self-pay | Admitting: Emergency Medicine

## 2015-12-07 ENCOUNTER — Emergency Department (HOSPITAL_COMMUNITY)
Admission: EM | Admit: 2015-12-07 | Discharge: 2015-12-07 | Disposition: A | Discharge status: Home / Self Care | Payer: 59 | Attending: Emergency Medicine | Admitting: Emergency Medicine

## 2015-12-07 DIAGNOSIS — F17200 Nicotine dependence, unspecified, uncomplicated: Secondary | ICD-10-CM | POA: Insufficient documentation

## 2015-12-07 DIAGNOSIS — Z79899 Other long term (current) drug therapy: Secondary | ICD-10-CM | POA: Diagnosis not present

## 2015-12-07 DIAGNOSIS — R0789 Other chest pain: Secondary | ICD-10-CM

## 2015-12-07 DIAGNOSIS — R072 Precordial pain: Secondary | ICD-10-CM | POA: Diagnosis not present

## 2015-12-07 HISTORY — DX: Hyperlipidemia, unspecified: E78.5

## 2015-12-07 LAB — CBC
HEMATOCRIT: 47.3 % — AB (ref 36.0–46.0)
HEMOGLOBIN: 15.9 g/dL — AB (ref 12.0–15.0)
MCH: 27.9 pg (ref 26.0–34.0)
MCHC: 33.6 g/dL (ref 30.0–36.0)
MCV: 83 fL (ref 78.0–100.0)
PLATELETS: 240 10*3/uL (ref 150–400)
RBC: 5.7 MIL/uL — AB (ref 3.87–5.11)
RDW: 14.7 % (ref 11.5–15.5)
WBC: 11.9 10*3/uL — AB (ref 4.0–10.5)

## 2015-12-07 LAB — HEPATIC FUNCTION PANEL
ALK PHOS: 93 U/L (ref 38–126)
ALT: 19 U/L (ref 14–54)
AST: 20 U/L (ref 15–41)
Albumin: 3.7 g/dL (ref 3.5–5.0)
BILIRUBIN INDIRECT: 0.4 mg/dL (ref 0.3–0.9)
Bilirubin, Direct: 0.2 mg/dL (ref 0.1–0.5)
TOTAL PROTEIN: 7.3 g/dL (ref 6.5–8.1)
Total Bilirubin: 0.6 mg/dL (ref 0.3–1.2)

## 2015-12-07 LAB — I-STAT TROPONIN, ED
Troponin i, poc: 0 ng/mL (ref 0.00–0.08)
Troponin i, poc: 0 ng/mL (ref 0.00–0.08)

## 2015-12-07 LAB — BASIC METABOLIC PANEL
Anion gap: 9 (ref 5–15)
BUN: 7 mg/dL (ref 6–20)
CHLORIDE: 104 mmol/L (ref 101–111)
CO2: 24 mmol/L (ref 22–32)
CREATININE: 0.99 mg/dL (ref 0.44–1.00)
Calcium: 9.5 mg/dL (ref 8.9–10.3)
GFR calc non Af Amer: >60 mL/min (ref >60)
Glucose, Bld: 131 mg/dL — ABNORMAL HIGH (ref 65–99)
POTASSIUM: 4 mmol/L (ref 3.5–5.1)
SODIUM: 137 mmol/L (ref 135–145)

## 2015-12-07 LAB — LIPASE, BLOOD: LIPASE: 32 U/L (ref 11–51)

## 2015-12-07 MED ORDER — BENZONATATE 100 MG PO CAPS: 100.0000 mg | ORAL_CAPSULE | Freq: Three times a day (TID) | ORAL | 0 refills | Status: DC

## 2015-12-07 MED ORDER — NAPROXEN 500 MG PO TABS: 500.0000 mg | ORAL_TABLET | Freq: Two times a day (BID) | ORAL | 0 refills | Status: DC

## 2015-12-07 MED ORDER — KETOROLAC TROMETHAMINE 30 MG/ML IJ SOLN
30.0000 mg | Freq: Once | INTRAMUSCULAR | Status: AC
  Administered 2015-12-07: 30 mg via INTRAVENOUS
  Filled 2015-12-07: qty 1

## 2015-12-07 MED ORDER — ONDANSETRON HCL 4 MG/2ML IJ SOLN
4.0000 mg | Freq: Once | INTRAMUSCULAR | Status: AC
  Administered 2015-12-07: 4 mg via INTRAVENOUS
  Filled 2015-12-07: qty 2

## 2015-12-07 MED ORDER — TRAMADOL HCL 50 MG PO TABS: 50.0000 mg | ORAL_TABLET | Freq: Four times a day (QID) | ORAL | 0 refills | Status: DC | PRN

## 2015-12-07 MED ORDER — MORPHINE SULFATE (PF) 4 MG/ML IV SOLN: 4.0000 mg | INTRAVENOUS | Status: DC | PRN

## 2015-12-07 NOTE — ED Triage Notes (Signed)
Pt reports woke at 2am with substernal dull chest pains. Pt reports some nausea no vomiting. Pt reports cough x1 month and wheezing. Upper abdominal pain.

## 2015-12-07 NOTE — Discharge Instructions (Signed)
Avoid exercise, lifting, etc.  Recheck with any worsening symptoms--call for appointment.

## 2015-12-07 NOTE — ED Notes (Signed)
Papers reviewed and patient verbalizes understanding. Leaving with minimal pain and sts she will take the medication. Leaving ambulatory.

## 2015-12-07 NOTE — ED Provider Notes (Signed)
[facility] DEPT Provider Note   CSN: [id number] Arrival date & time: 12/07/15  0818     History   Chief Complaint Chief Complaint  Patient presents with  . Chest Pain  . Abdominal Pain    HPI Rhonda Brown is a 50 y.o. female. She presents here after awakening at 2:00 this morning with chest pain.  She has past similar episode 2 years ago she was admitted overnight at [facility]. She does not recall the results of her testing. States that she was told she did not have a heart attack.  Has history of known high cholesterol. Is not on medications for this. No history of hypertension, diabetes. Negative family history for heart disease in her parents or 2 siblings she does smoke.  Waking at 2. Pain is sternal and parasternal into her upper epigastrium. Shelle Iron for it. Has had a cough for a month. Is now productive of some thin sputum. No shortness of breath or fevers. Does have some discomfort in her chest with her cough. No pain into her neck back or jaw. No relief with Tums during the night.  HPI  Past Medical History:  Diagnosis Date  . Hyperlipidemia     There are no active problems to display for this patient.   Past Surgical History:  Procedure Laterality Date  . APPENDECTOMY    . TONSILLECTOMY    . TUBAL LIGATION      OB History    No data available       Home Medications    Prior to Admission medications   Medication Sig Start Date End Date Taking? Authorizing Provider  benzonatate (TESSALON) 100 MG capsule Take 1 capsule (100 mg total) by mouth every 8 (eight) hours. 12/07/15   Tanna Furry, MD  meloxicam (MOBIC) 7.5 MG tablet Take 1 tablet (7.5 mg total) by mouth daily. Patient not taking: Reported on 07/03/2015 12/25/14   Clayton Bibles, PA-C  naproxen (NAPROSYN) 500 MG tablet Take 1 tablet (500 mg total) by mouth 2 (two) times daily. 12/07/15   Tanna Furry, MD  PRESCRIPTION MEDICATION Take 1 capsule by mouth 2 (two) times daily.     Historical Provider, MD  traMADol (ULTRAM) 50 MG tablet Take 1 tablet (50 mg total) by mouth every 6 (six) hours as needed. 12/07/15   Tanna Furry, MD    Family History History reviewed. No pertinent family history.  Social History Social History  Substance Use Topics  . Smoking status: Current Every Day Smoker    Packs/day: 2.00  . Smokeless tobacco: Not on file  . Alcohol use No     Allergies   Review of patient's allergies indicates no known allergies.   Review of Systems Review of Systems  Constitutional: Negative for appetite change, chills, diaphoresis, fatigue and fever.  HENT: Negative for mouth sores, sore throat and trouble swallowing.   Eyes: Negative for visual disturbance.  Respiratory: Negative for cough, chest tightness, shortness of breath and wheezing.   Cardiovascular: Positive for chest pain.  Gastrointestinal: Positive for nausea. Negative for abdominal distention, abdominal pain, diarrhea and vomiting.  Endocrine: Negative for polydipsia, polyphagia and polyuria.  Genitourinary: Negative for dysuria, frequency and hematuria.  Musculoskeletal: Negative for gait problem.  Skin: Negative for color change, pallor and rash.  Neurological: Negative for dizziness, syncope, light-headedness and headaches.  Hematological: Does not bruise/bleed easily.  Psychiatric/Behavioral: Negative for behavioral problems and confusion.     Physical Exam Updated Vital Signs BP 112/98  Pulse 68   Temp 98.6 F (37 C)   Resp 20   SpO2 97%   Physical Exam  Constitutional: She is oriented to person, place, and time. She appears well-developed and well-nourished. No distress.  HENT:  Head: Normocephalic.  Eyes: Conjunctivae are normal. Pupils are equal, round, and reactive to light. No scleral icterus.  Neck: Normal range of motion. Neck supple. No thyromegaly present.  Cardiovascular: Normal rate and regular rhythm.  Exam reveals no gallop and no friction rub.   No  murmur heard. Pulmonary/Chest: Effort normal and breath sounds normal. No respiratory distress. She has no wheezes. She has no rales.    Tenderness to the bilateral parasternal anterior chest. Clear bilateral breath sounds without wheezing rales or rhonchi.  Abdominal: Soft. Bowel sounds are normal. She exhibits no distension. There is no tenderness. There is no rebound.  Musculoskeletal: Normal range of motion.  Neurological: She is alert and oriented to person, place, and time.  Skin: Skin is warm and dry. No rash noted.  Psychiatric: She has a normal mood and affect. Her behavior is normal.     ED Treatments / Results  Labs (all labs ordered are listed, but only abnormal results are displayed) Labs Reviewed  BASIC METABOLIC PANEL - Abnormal; Notable for the following:       Result Value   Glucose, Bld 131 (*)    All other components within normal limits  CBC - Abnormal; Notable for the following:    WBC 11.9 (*)    RBC 5.70 (*)    Hemoglobin 15.9 (*)    HCT 47.3 (*)    All other components within normal limits  LIPASE, BLOOD  HEPATIC FUNCTION PANEL  I-STAT TROPOININ, ED  I-STAT TROPOININ, ED    EKG  EKG Interpretation  Date/Time:  Thursday December 07 2015 08:29:13 EDT Ventricular Rate:  89 PR Interval:    QRS Duration: 85 QT Interval:  404 QTC Calculation: 492 R Axis:   -95 Text Interpretation:  Sinus rhythm Markedly posterior QRS axis Low voltage, extremity and precordial leads slow R progression. No comparison Confirmed by Jeneen Rinks  MD, Ingram ([id number]) on 12/07/2015 8:51:37 AM       Radiology Dg Chest 2 View  Result Date: 12/07/2015 CLINICAL DATA:  50 year old female with acute chest pain today. EXAM: CHEST  2 VIEW COMPARISON:  07/25/2010 and prior radiograph FINDINGS: The cardiomediastinal silhouette is unremarkable. Mild left basilar scarring again noted. There is no evidence of focal airspace disease, pulmonary edema, suspicious pulmonary nodule/mass, pleural  effusion, or pneumothorax. No acute bony abnormalities are identified. IMPRESSION: No active cardiopulmonary disease. Electronically Signed   By: Margarette Canada M.D.   On: 12/07/2015 09:19    Procedures Procedures (including critical care time)  Medications Ordered in ED Medications  morphine 4 MG/ML injection 4 mg (4 mg Intravenous Not Given 12/07/15 1222)  ondansetron (ZOFRAN) injection 4 mg (4 mg Intravenous Given 12/07/15 0856)  ketorolac (TORADOL) 30 MG/ML injection 30 mg (30 mg Intravenous Given 12/07/15 0856)     Initial Impression / Assessment and Plan / ED Course  I have reviewed the triage vital signs and the nursing notes.  Pertinent labs & imaging results that were available during my care of the patient were reviewed by me and considered in my medical decision making (see chart for details).  Clinical Course    KG shows slow R wave progression but no ST or T-wave changes. Discussion symptoms and findings seem can system with chest  wall pain likely secondary to her cough. Await EKG and enzyme testing. We'll give symptom control with Toradol, morphine, Zofran. Reevaluation.  Final Clinical Impressions(s) / ED Diagnoses   Final diagnoses:  Chest wall pain    Patient with a heart score of 3, one point for age, one point for nonspecific EKG changes, one point for hypercholesterolemia and smoking. She is appropriate for discharge. Her symptoms are reproducible and she has normal initial, and three-hour delta troponin. Plan anti-inflammatories, cough medications, pain control. Discussed avoiding activities that would cause movement of her chest wall such as exercise heavy lifting coughing etc. Primary care follow-up. ER with acute changes.  New Prescriptions New Prescriptions   BENZONATATE (TESSALON) 100 MG CAPSULE    Take 1 capsule (100 mg total) by mouth every 8 (eight) hours.   NAPROXEN (NAPROSYN) 500 MG TABLET    Take 1 tablet (500 mg total) by mouth 2 (two) times daily.    TRAMADOL (ULTRAM) 50 MG TABLET    Take 1 tablet (50 mg total) by mouth every 6 (six) hours as needed.     Tanna Furry, MD 12/07/15 1226

## 2016-02-12 HISTORY — PX: CORONARY ANGIOPLASTY WITH STENT PLACEMENT: SHX49

## 2016-02-13 DIAGNOSIS — I214 Non-ST elevation (NSTEMI) myocardial infarction: Secondary | ICD-10-CM

## 2016-02-13 HISTORY — DX: Non-ST elevation (NSTEMI) myocardial infarction: I21.4

## 2016-02-25 ENCOUNTER — Inpatient Hospital Stay (HOSPITAL_COMMUNITY)
Admission: EM | Admit: 2016-02-25 | Discharge: 2016-02-27 | DRG: 281 | Disposition: A | Discharge status: Home / Self Care | Payer: 59 | Attending: Internal Medicine | Admitting: Internal Medicine

## 2016-02-25 ENCOUNTER — Other Ambulatory Visit: Payer: Self-pay

## 2016-02-25 ENCOUNTER — Emergency Department (HOSPITAL_COMMUNITY): Payer: 59

## 2016-02-25 ENCOUNTER — Encounter (HOSPITAL_COMMUNITY): Payer: Self-pay | Admitting: *Deleted

## 2016-02-25 DIAGNOSIS — D72829 Elevated white blood cell count, unspecified: Secondary | ICD-10-CM | POA: Diagnosis present

## 2016-02-25 DIAGNOSIS — E349 Endocrine disorder, unspecified: Secondary | ICD-10-CM

## 2016-02-25 DIAGNOSIS — R778 Other specified abnormalities of plasma proteins: Secondary | ICD-10-CM

## 2016-02-25 DIAGNOSIS — I25110 Atherosclerotic heart disease of native coronary artery with unstable angina pectoris: Secondary | ICD-10-CM | POA: Diagnosis not present

## 2016-02-25 DIAGNOSIS — R079 Chest pain, unspecified: Secondary | ICD-10-CM | POA: Diagnosis not present

## 2016-02-25 DIAGNOSIS — E876 Hypokalemia: Secondary | ICD-10-CM | POA: Diagnosis present

## 2016-02-25 DIAGNOSIS — E785 Hyperlipidemia, unspecified: Secondary | ICD-10-CM | POA: Diagnosis present

## 2016-02-25 DIAGNOSIS — Z955 Presence of coronary angioplasty implant and graft: Secondary | ICD-10-CM

## 2016-02-25 DIAGNOSIS — Z720 Tobacco use: Secondary | ICD-10-CM | POA: Diagnosis present

## 2016-02-25 DIAGNOSIS — Z87891 Personal history of nicotine dependence: Secondary | ICD-10-CM

## 2016-02-25 DIAGNOSIS — I2510 Atherosclerotic heart disease of native coronary artery without angina pectoris: Secondary | ICD-10-CM | POA: Diagnosis present

## 2016-02-25 DIAGNOSIS — R7989 Other specified abnormal findings of blood chemistry: Secondary | ICD-10-CM | POA: Diagnosis present

## 2016-02-25 DIAGNOSIS — E860 Dehydration: Secondary | ICD-10-CM | POA: Diagnosis present

## 2016-02-25 DIAGNOSIS — R748 Abnormal levels of other serum enzymes: Secondary | ICD-10-CM | POA: Diagnosis not present

## 2016-02-25 DIAGNOSIS — Z791 Long term (current) use of non-steroidal anti-inflammatories (NSAID): Secondary | ICD-10-CM

## 2016-02-25 DIAGNOSIS — I959 Hypotension, unspecified: Secondary | ICD-10-CM | POA: Diagnosis present

## 2016-02-25 DIAGNOSIS — I214 Non-ST elevation (NSTEMI) myocardial infarction: Secondary | ICD-10-CM | POA: Diagnosis present

## 2016-02-25 HISTORY — DX: Personal history of other medical treatment: Z92.89

## 2016-02-25 HISTORY — DX: Headache: R51

## 2016-02-25 HISTORY — DX: Non-ST elevation (NSTEMI) myocardial infarction: I21.4

## 2016-02-25 HISTORY — DX: Tobacco use: Z72.0

## 2016-02-25 HISTORY — DX: Headache, unspecified: R51.9

## 2016-02-25 HISTORY — DX: Personal history of urinary calculi: Z87.442

## 2016-02-25 HISTORY — DX: Migraine, unspecified, not intractable, without status migrainosus: G43.909

## 2016-02-25 HISTORY — DX: Atherosclerotic heart disease of native coronary artery without angina pectoris: I25.10

## 2016-02-25 HISTORY — DX: Angina pectoris, unspecified: I20.9

## 2016-02-25 LAB — BASIC METABOLIC PANEL
ANION GAP: 13 (ref 5–15)
BUN: 6 mg/dL (ref 6–20)
CALCIUM: 9.2 mg/dL (ref 8.9–10.3)
CHLORIDE: 104 mmol/L (ref 101–111)
CO2: 20 mmol/L — AB (ref 22–32)
Creatinine, Ser: 0.79 mg/dL (ref 0.44–1.00)
GFR calc non Af Amer: >60 mL/min (ref >60)
Glucose, Bld: 139 mg/dL — ABNORMAL HIGH (ref 65–99)
Potassium: 3 mmol/L — ABNORMAL LOW (ref 3.5–5.1)
Sodium: 137 mmol/L (ref 135–145)

## 2016-02-25 LAB — TROPONIN I
TROPONIN I: 0.05 ng/mL — AB (ref <0.03)
Troponin I: <0.03 ng/mL (ref <0.03)

## 2016-02-25 LAB — HCG, QUANTITATIVE, PREGNANCY: HCG, BETA CHAIN, QUANT, S: 6 m[IU]/mL — AB (ref <5)

## 2016-02-25 LAB — CBC
HCT: 43.2 % (ref 36.0–46.0)
HEMOGLOBIN: 14.7 g/dL (ref 12.0–15.0)
MCH: 27.6 pg (ref 26.0–34.0)
MCHC: 34 g/dL (ref 30.0–36.0)
MCV: 81.1 fL (ref 78.0–100.0)
Platelets: 236 10*3/uL (ref 150–400)
RBC: 5.33 MIL/uL — AB (ref 3.87–5.11)
RDW: 14.6 % (ref 11.5–15.5)
WBC: 12.7 10*3/uL — ABNORMAL HIGH (ref 4.0–10.5)

## 2016-02-25 LAB — BRAIN NATRIURETIC PEPTIDE: B Natriuretic Peptide: 28.6 pg/mL (ref 0.0–100.0)

## 2016-02-25 MED ORDER — TICAGRELOR 90 MG PO TABS
90.0000 mg | ORAL_TABLET | Freq: Two times a day (BID) | ORAL | Status: DC
  Administered 2016-02-25: 90 mg via ORAL
  Filled 2016-02-25 (×2): qty 1

## 2016-02-25 MED ORDER — MORPHINE SULFATE (PF) 4 MG/ML IV SOLN: 2.0000 mg | INTRAVENOUS | Status: DC | PRN

## 2016-02-25 MED ORDER — ZOLPIDEM TARTRATE 5 MG PO TABS
5.0000 mg | ORAL_TABLET | Freq: Every evening | ORAL | Status: DC | PRN
  Administered 2016-02-25 – 2016-02-26 (×2): 5 mg via ORAL
  Filled 2016-02-25 (×2): qty 1

## 2016-02-25 MED ORDER — HEPARIN BOLUS VIA INFUSION
4000.0000 [IU] | Freq: Once | INTRAVENOUS | Status: AC
  Administered 2016-02-25: 4000 [IU] via INTRAVENOUS
  Filled 2016-02-25: qty 4000

## 2016-02-25 MED ORDER — ALPRAZOLAM 0.25 MG PO TABS
0.2500 mg | ORAL_TABLET | Freq: Two times a day (BID) | ORAL | Status: DC | PRN
  Administered 2016-02-25 – 2016-02-26 (×2): 0.25 mg via ORAL
  Filled 2016-02-25 (×2): qty 1

## 2016-02-25 MED ORDER — NITROGLYCERIN 0.4 MG SL SUBL: 0.4000 mg | SUBLINGUAL_TABLET | SUBLINGUAL | Status: DC | PRN

## 2016-02-25 MED ORDER — HEPARIN (PORCINE) IN NACL 100-0.45 UNIT/ML-% IJ SOLN
1100.0000 [IU]/h | INTRAMUSCULAR | Status: DC
  Administered 2016-02-25: 850 [IU]/h via INTRAVENOUS
  Filled 2016-02-25 (×3): qty 250

## 2016-02-25 MED ORDER — ATORVASTATIN CALCIUM 80 MG PO TABS
80.0000 mg | ORAL_TABLET | Freq: Every day | ORAL | Status: DC
  Administered 2016-02-25 – 2016-02-27 (×3): 80 mg via ORAL
  Filled 2016-02-25 (×3): qty 1

## 2016-02-25 MED ORDER — METOPROLOL TARTRATE 25 MG PO TABS
25.0000 mg | ORAL_TABLET | Freq: Two times a day (BID) | ORAL | Status: DC
  Administered 2016-02-25 – 2016-02-26 (×3): 25 mg via ORAL
  Filled 2016-02-25 (×3): qty 1

## 2016-02-25 MED ORDER — ASPIRIN EC 81 MG PO TBEC
81.0000 mg | DELAYED_RELEASE_TABLET | Freq: Every day | ORAL | Status: DC
  Administered 2016-02-26 – 2016-02-27 (×2): 81 mg via ORAL
  Filled 2016-02-25 (×3): qty 1

## 2016-02-25 MED ORDER — HEPARIN SODIUM (PORCINE) 5000 UNIT/ML IJ SOLN: 5000.0000 [IU] | Freq: Three times a day (TID) | INTRAMUSCULAR | Status: DC

## 2016-02-25 MED ORDER — NICOTINE 21 MG/24HR TD PT24
21.0000 mg | MEDICATED_PATCH | Freq: Every day | TRANSDERMAL | Status: DC
  Administered 2016-02-25 – 2016-02-27 (×3): 21 mg via TRANSDERMAL
  Filled 2016-02-25 (×3): qty 1

## 2016-02-25 MED ORDER — ONDANSETRON HCL 4 MG/2ML IJ SOLN: 4.0000 mg | Freq: Four times a day (QID) | INTRAMUSCULAR | Status: DC | PRN

## 2016-02-25 MED ORDER — POTASSIUM CHLORIDE 20 MEQ/15ML (10%) PO SOLN
40.0000 meq | Freq: Once | ORAL | Status: DC
  Filled 2016-02-25: qty 30

## 2016-02-25 MED ORDER — ISOSORBIDE MONONITRATE ER 30 MG PO TB24
30.0000 mg | ORAL_TABLET | Freq: Every day | ORAL | Status: DC
  Administered 2016-02-25 – 2016-02-26 (×2): 30 mg via ORAL
  Filled 2016-02-25 (×2): qty 1

## 2016-02-25 MED ORDER — ACETAMINOPHEN 325 MG PO TABS
650.0000 mg | ORAL_TABLET | ORAL | Status: DC | PRN
  Administered 2016-02-26: 650 mg via ORAL
  Filled 2016-02-25: qty 2

## 2016-02-25 NOTE — Progress Notes (Signed)
ANTICOAGULATION CONSULT NOTE - Initial Consult  Pharmacy Consult for Heparin Indication: chest pain/ACS  No Known Allergies  Patient Measurements:    Ht: 62 in Wt: 80 kg IBW: 50kg Heparin Dosing Weight: 68 kg  Vital Signs: Temp: 98.7 F (37.1 C) (12/31 1827) Temp Source: Oral (12/31 1827) BP: 113/83 (12/31 2115) Pulse Rate: 87 (12/31 2115)  Labs:  Recent Labs  02/25/16 1817  HGB 14.7  HCT 43.2  PLT 236  CREATININE 0.79  TROPONINI 0.05*    CrCl cannot be calculated (Unknown ideal weight.).   Medical History: Past Medical History:  Diagnosis Date  . CAD (coronary artery disease)   . Hyperlipidemia   . MI (myocardial infarction)   . Tobacco abuse     Medications:  Prescriptions Prior to Admission  Medication Sig Dispense Refill Last Dose  . aspirin 81 MG chewable tablet Chew 81 mg by mouth daily.   02/25/2016 at Unknown time  . atorvastatin (LIPITOR) 80 MG tablet Take 80 mg by mouth at bedtime.   02/24/2016 at Unknown time  . metoprolol tartrate (LOPRESSOR) 25 MG tablet Take 25 mg by mouth 2 (two) times daily.   02/25/2016 at 1000  . nitroGLYCERIN (NITROSTAT) 0.4 MG SL tablet Place 0.4 mg under the tongue every 5 (five) minutes as needed for chest pain.   02/25/2016 at Unknown time  . ticagrelor (BRILINTA) 90 MG TABS tablet Take 90 mg by mouth 2 (two) times daily.   02/25/2016 at 1000    Assessment: 50 y.o. F presents with CP. Pt with recent [place] hospital stay for MI/stent placement on 02/13/16. CBC ok on admission. To begin heparin for r/o ACS.  Goal of Therapy:  Heparin level 0.3-0.7 units/ml Monitor platelets by anticoagulation protocol: Yes   Plan:  Heparin IV bolus 4000 units Heparin gtt at 850 units/hr Will f/u heparin level in hours Daily heparin level and CBC  Sherlon Handing, PharmD, BCPS Clinical pharmacist, pager [phone] 02/25/2016,9:45 PM

## 2016-02-25 NOTE — ED Triage Notes (Signed)
The pt is c/o sob and chest tightness  Today  She was just released from a hospital in [place]  After having heart attack   With stent  Placement lopressor has been increased  Dec 2th and since then she  Had more sob and chest pain  lmp none

## 2016-02-25 NOTE — ED Provider Notes (Signed)
[facility] DEPT Provider Note   CSN: [id number] Arrival date & time: 02/25/16  1753     History   Chief Complaint Chief Complaint  Patient presents with  . Shortness of Breath    HPI Rhonda Brown is a 50 y.o. female.  HPI Patient presents with chest pain shortness of breath. She had a heart attack with stent around 2 weeks ago and [facility]. States that she had the attack on the 18th of the did not see it on the EKG but found by the blood work. Reviewing the records she brought her troponin went up to 0.3 and she had a proximal LAD stent for a 95% lesion. States she followed up with the cardiologist up in [place] and states she was still having shortness of breath with exertion. States she also gets pain in her left chest. States will, on which she takes a few steps. States she is not able to walk nearly as much as she did before. States that the cardiologist wanted a echocardiogram which was not able to get up and she was coming from back down to [place]. Looks as if she may follow-up with Dr. Meda Coffee here but nothing has been arranged. She states that they increased her metoprolol and she is not worse and stated that. States her heart rate up to 140s at times also. The pain is dull and on her left chest.   Past Medical History:  Diagnosis Date  . CAD (coronary artery disease)   . Hyperlipidemia   . MI (myocardial infarction)   . Tobacco abuse     Patient Active Problem List   Diagnosis Date Noted  . CAD (coronary artery disease) 02/25/2016  . Chest pain 02/25/2016  . Hypokalemia 02/25/2016  . Tobacco abuse   . Hyperlipidemia   . Elevated troponin     Past Surgical History:  Procedure Laterality Date  . APPENDECTOMY    . CORONARY ANGIOPLASTY WITH STENT PLACEMENT    . TONSILLECTOMY    . TUBAL LIGATION      OB History    No data available       Home Medications    Prior to Admission medications   Medication Sig Start Date End Date Taking?  Authorizing Provider  aspirin 81 MG chewable tablet Chew 81 mg by mouth daily.   Yes Historical Provider, MD  atorvastatin (LIPITOR) 80 MG tablet Take 80 mg by mouth at bedtime.   Yes Historical Provider, MD  metoprolol tartrate (LOPRESSOR) 25 MG tablet Take 25 mg by mouth 2 (two) times daily.   Yes Historical Provider, MD  nitroGLYCERIN (NITROSTAT) 0.4 MG SL tablet Place 0.4 mg under the tongue every 5 (five) minutes as needed for chest pain.   Yes Historical Provider, MD  ticagrelor (BRILINTA) 90 MG TABS tablet Take 90 mg by mouth 2 (two) times daily.   Yes Historical Provider, MD    Family History Family History  Problem Relation Age of Onset  . Diabetes Mellitus II Mother   . Stroke Mother   . Renal cancer Father   . Heart attack Father   . Stroke Sister   . Diabetes Mellitus II Sister     Social History Social History  Substance Use Topics  . Smoking status: Former Smoker    Packs/day: 2.00  . Smokeless tobacco: Never Used     Comment: quit in 01/2016  . Alcohol use No     Allergies   Patient has no known allergies.  Review of Systems Review of Systems  Constitutional: Negative for appetite change.  HENT: Negative for congestion.   Respiratory: Positive for shortness of breath.   Cardiovascular: Positive for chest pain.  Gastrointestinal: Negative for abdominal pain.  Genitourinary: Negative for dysuria.  Musculoskeletal: Negative for back pain.  Neurological: Negative for seizures.  Psychiatric/Behavioral: Negative for confusion.     Physical Exam Updated Vital Signs BP 113/83   Pulse 87   Temp 98.1 F (36.7 C) (Oral)   Resp 15   Wt 177 lb 14.6 oz (80.7 kg)   SpO2 100%   BMI 32.54 kg/m   Physical Exam  Constitutional: She appears well-developed and well-nourished.  Cardiovascular: Normal rate and regular rhythm.   Pulmonary/Chest: Effort normal. No respiratory distress.  Abdominal: Soft. There is no tenderness.  Musculoskeletal: Normal range of  motion. She exhibits no edema.  Neurological: She is alert.  Skin: Skin is warm.     ED Treatments / Results  Labs (all labs ordered are listed, but only abnormal results are displayed) Labs Reviewed  BASIC METABOLIC PANEL - Abnormal; Notable for the following:       Result Value   Potassium 3.0 (*)    CO2 20 (*)    Glucose, Bld 139 (*)    All other components within normal limits  CBC - Abnormal; Notable for the following:    WBC 12.7 (*)    RBC 5.33 (*)    All other components within normal limits  TROPONIN I - Abnormal; Notable for the following:    Troponin I 0.05 (*)    All other components within normal limits  BRAIN NATRIURETIC PEPTIDE  TROPONIN I  RAPID URINE DRUG SCREEN, HOSP PERFORMED  HEMOGLOBIN A1C  LIPID PANEL  TROPONIN I  TROPONIN I  HCG, QUANTITATIVE, PREGNANCY  MAGNESIUM  HEPARIN LEVEL (UNFRACTIONATED)  CBC    EKG  EKG Interpretation  Date/Time:  Sunday February 25 2016 18:05:34 EST Ventricular Rate:  107 PR Interval:  150 QRS Duration: 78 QT Interval:  338 QTC Calculation: 451 R Axis:   114 Text Interpretation:  Sinus tachycardia Right axis deviation T wave abnormality, consider anterolateral ischemia Abnormal ECG Confirmed by Alvino Chapel  MD, Ovid Curd [phone]) on 02/25/2016 6:40:05 PM       Radiology Dg Chest 2 View  Result Date: 02/25/2016 CLINICAL DATA:  Shortness of breath and lethargy over the last 3 weeks. Recent coronary stent placement. EXAM: CHEST  2 VIEW COMPARISON:  12/07/2015 FINDINGS: Heart size is normal. Mediastinal shadows are normal. There is mild linear scarring or atelectasis in the lower lungs, similar to the study of 12/07/2015. No evidence of congestive heart failure or effusion. No bone abnormality. IMPRESSION: Mild linear scarring or atelectasis at the lung bases, unchanged since the previous study. Electronically Signed   By: Nelson Chimes M.D.   On: 02/25/2016 18:47    Procedures Procedures (including critical care  time)  Medications Ordered in ED Medications  ticagrelor (BRILINTA) tablet 90 mg (90 mg Oral Given 02/25/16 2237)  atorvastatin (LIPITOR) tablet 80 mg (80 mg Oral Given 02/25/16 2237)  metoprolol tartrate (LOPRESSOR) tablet 25 mg (25 mg Oral Given 02/25/16 2238)  nicotine (NICODERM CQ - dosed in mg/24 hours) patch 21 mg (21 mg Transdermal Patch Applied 02/25/16 2236)  aspirin EC tablet 81 mg (81 mg Oral Not Given 02/25/16 2200)  nitroGLYCERIN (NITROSTAT) SL tablet 0.4 mg (not administered)  morphine 4 MG/ML injection 2 mg (not administered)  acetaminophen (TYLENOL) tablet 650 mg (not  administered)  ondansetron (ZOFRAN) injection 4 mg (not administered)  zolpidem (AMBIEN) tablet 5 mg (not administered)  ALPRAZolam (XANAX) tablet 0.25 mg (not administered)  heparin ADULT infusion 100 units/mL (25000 units/250mL sodium chloride 0.45%) (850 Units/hr Intravenous New Bag/Given 02/25/16 2242)  isosorbide mononitrate (IMDUR) 24 hr tablet 30 mg (30 mg Oral Given 02/25/16 2246)  potassium chloride 20 MEQ/15ML (10%) solution 40 mEq (40 mEq Oral Given 02/25/16 2242)  heparin bolus via infusion 4,000 Units (4,000 Units Intravenous Bolus from Bag 02/25/16 2242)     Initial Impression / Assessment and Plan / ED Course  I have reviewed the triage vital signs and the nursing notes.  Pertinent labs & imaging results that were available during my care of the patient were reviewed by me and considered in my medical decision making (see chart for details).  Clinical Course     Patient with potential anginal chest pain. Had a stent around 2 weeks ago. Troponin minimally elevated which could still be coming down the newly going up. Pain-free at rest but does have a with exertion. Reviewed cath report the patient had with her and showed a 95% LAD lesion that was stented. No ejection fraction appeared to be done. Will admit to internal medicine after discussed with the cardiology fellow.  Final Clinical  Impressions(s) / ED Diagnoses   Final diagnoses:  Tobacco abuse  Hyperlipidemia, unspecified hyperlipidemia type  Chest pain, unspecified type  Elevated troponin    New Prescriptions Current Discharge Medication List       Davonna Belling, MD 02/25/16 2315

## 2016-02-25 NOTE — ED Notes (Signed)
Attempted to call report

## 2016-02-25 NOTE — H&P (Addendum)
History and Physical    Rhonda Brown [id number] DOB: [birth year] DOA: 02/25/2016  Referring MD/NP/PA:   PCP: No PCP Per Patient   Patient coming from:  The patient is coming from home.  At baseline, pt is independent for most of ADL.   Chief Complaint: chest pain and SOB  HPI: Rhonda Brown is a 50 y.o. female with medical history significant of tobacco abuse, hyperlipidemia, CAD, recent MT, s/p of DES to LAD on 02/13/16, who presents with chest pain.  Patient was recently admitted to [facility] in [place] [phone]) due to MI. She had cardiac cath done, which showed trifurcation lesions Involving LAD (95%), diagonal 1 and 2 lesion. She is s/p of DES to LAD 02/13/16. She was discharged on 02/16/16. Pt states that she continues to have chest pressure, SOB and weakness. Today her chest pressure has worsened. It is located in the substernal area, constant, 5 out of 10 in severity, pressure-like, radiating to the left jaw and the neck. It is associated with SOB. She has dry cough, no fever or chills. She has nausea, but no vomiting, abdominal pain. She had diarrhea 2 days ago, which has completely resolved. Currently no diarrhea or loose stool. Patient denies symptoms of UTI, runny nose, sore throat, unilateral weakness. Pt drove back from [place] on 02/14/16, but denies calf pain.  ED Course: pt was found to have trop 0.05 (her trop was 0.34 on 02/14/16), WBC 12.7, BNP 28, potassium 3.0, creatinine normal, temperature normal, oxygen saturation 97% on room air, chest x-rays negative for any infiltration. Patient is placed on tele bed for observation.  Review of Systems:   General: no fevers, chills, no changes in body weight, has poor appetite, has fatigue HEENT: no blurry vision, hearing changes or sore throat Respiratory: has dyspnea, coughing, no wheezing CV: has chest pain, no palpitations GI: has nausea, no vomiting, abdominal pain, diarrhea, constipation GU: no  dysuria, burning on urination, increased urinary frequency, hematuria  Ext: no leg edema Neuro: no unilateral weakness, numbness, or tingling, no vision change or hearing loss Skin: no rash, no skin tear. MSK: No muscle spasm, no deformity, no limitation of range of movement in spin Heme: No easy bruising.  Travel history: No recent long distant travel.  Allergy: No Known Allergies  Past Medical History:  Diagnosis Date  . CAD (coronary artery disease)   . Hyperlipidemia   . MI (myocardial infarction)   . Tobacco abuse     Past Surgical History:  Procedure Laterality Date  . APPENDECTOMY    . CORONARY ANGIOPLASTY WITH STENT PLACEMENT    . TONSILLECTOMY    . TUBAL LIGATION      Social History:  reports that she has quit smoking. She smoked 2.00 packs per day. She has never used smokeless tobacco. She reports that she uses drugs, including Marijuana. She reports that she does not drink alcohol.  Family History:  Family History  Problem Relation Age of Onset  . Diabetes Mellitus II Mother   . Stroke Mother   . Renal cancer Father   . Heart attack Father   . Stroke Sister   . Diabetes Mellitus II Sister      Prior to Admission medications   Medication Sig Start Date End Date Taking? Authorizing Provider  benzonatate (TESSALON) 100 MG capsule Take 1 capsule (100 mg total) by mouth every 8 (eight) hours. 12/07/15   Tanna Furry, MD  meloxicam (MOBIC) 7.5 MG tablet Take 1 tablet (7.5 mg total) by mouth  daily. Patient not taking: Reported on 07/03/2015 12/25/14   Clayton Bibles, PA-C  naproxen (NAPROSYN) 500 MG tablet Take 1 tablet (500 mg total) by mouth 2 (two) times daily. 12/07/15   Tanna Furry, MD  PRESCRIPTION MEDICATION Take 1 capsule by mouth 2 (two) times daily.    Historical Provider, MD  traMADol (ULTRAM) 50 MG tablet Take 1 tablet (50 mg total) by mouth every 6 (six) hours as needed. 12/07/15   Tanna Furry, MD    Physical Exam: Vitals:   02/25/16 2115 02/25/16 2143  02/25/16 2147 02/26/16 0038  BP: 113/83   (!) 89/54  Pulse: 87   84  Resp: 15   18  Temp:  98.1 F (36.7 C)  98.1 F (36.7 C)  TempSrc:  Oral  Oral  SpO2: 100%   99%  Weight:   80.7 kg (177 lb 14.6 oz)    General: Not in acute distress HEENT:       Eyes: PERRL, EOMI, no scleral icterus.       ENT: No discharge from the ears and nose, no pharynx injection, no tonsillar enlargement.        Neck: No JVD, no bruit, no mass felt. Heme: No neck lymph node enlargement. Cardiac: S1/S2, RRR, No murmurs, No gallops or rubs. Respiratory: No rales, wheezing, rhonchi or rubs. GI: Soft, nondistended, nontender, no rebound pain, no organomegaly, BS present. GU: No hematuria Ext: No pitting leg edema bilaterally. 2+DP/PT pulse bilaterally. Musculoskeletal: No joint deformities, No joint redness or warmth, no limitation of ROM in spin. Skin: No rashes.  Neuro: Alert, oriented X3, cranial nerves II-XII grossly intact, moves all extremities normally.  Psych: Patient is not psychotic, no suicidal or hemocidal ideation.  Labs on Admission: I have personally reviewed following labs and imaging studies  CBC:  Recent Labs Lab 02/25/16 1817  WBC 12.7*  HGB 14.7  HCT 43.2  MCV 81.1  PLT [id number]   Basic Metabolic Panel:  Recent Labs Lab 02/25/16 1817  NA 137  K 3.0*  CL 104  CO2 20*  GLUCOSE 139*  BUN 6  CREATININE 0.79  CALCIUM 9.2   GFR: CrCl cannot be calculated (Unknown ideal weight.). Liver Function Tests: No results for input(s): AST, ALT, ALKPHOS, BILITOT, PROT, ALBUMIN in the last 168 hours. No results for input(s): LIPASE, AMYLASE in the last 168 hours. No results for input(s): AMMONIA in the last 168 hours. Coagulation Profile: No results for input(s): INR, PROTIME in the last 168 hours. Cardiac Enzymes:  Recent Labs Lab 02/25/16 1817 02/25/16 2214  TROPONINI 0.05* <0.03   BNP (last 3 results) No results for input(s): PROBNP in the last 8760 hours. HbA1C: No  results for input(s): HGBA1C in the last 72 hours. CBG: No results for input(s): GLUCAP in the last 168 hours. Lipid Profile: No results for input(s): CHOL, HDL, LDLCALC, TRIG, CHOLHDL, LDLDIRECT in the last 72 hours. Thyroid Function Tests: No results for input(s): TSH, T4TOTAL, FREET4, T3FREE, THYROIDAB in the last 72 hours. Anemia Panel: No results for input(s): VITAMINB12, FOLATE, FERRITIN, TIBC, IRON, RETICCTPCT in the last 72 hours. Urine analysis:    Component Value Date/Time   COLORURINE YELLOW 07/03/2015 0238   APPEARANCEUR CLEAR 07/03/2015 0238   LABSPEC 1.028 07/03/2015 0238   PHURINE 5.5 07/03/2015 0238   GLUCOSEU NEGATIVE 07/03/2015 0238   HGBUR NEGATIVE 07/03/2015 0238   BILIRUBINUR NEGATIVE 07/03/2015 0238   KETONESUR NEGATIVE 07/03/2015 0238   PROTEINUR NEGATIVE 07/03/2015 0238   UROBILINOGEN 0.2 05/24/2008 [id number]  NITRITE NEGATIVE 07/03/2015 0238   LEUKOCYTESUR NEGATIVE 07/03/2015 0238   Sepsis Labs: @LABRCNTIP (procalcitonin:4,lacticidven:4) )No results found for this or any previous visit (from the past 240 hour(s)).   Radiological Exams on Admission: Dg Chest 2 View  Result Date: 02/25/2016 CLINICAL DATA:  Shortness of breath and lethargy over the last 3 weeks. Recent coronary stent placement. EXAM: CHEST  2 VIEW COMPARISON:  12/07/2015 FINDINGS: Heart size is normal. Mediastinal shadows are normal. There is mild linear scarring or atelectasis in the lower lungs, similar to the study of 12/07/2015. No evidence of congestive heart failure or effusion. No bone abnormality. IMPRESSION: Mild linear scarring or atelectasis at the lung bases, unchanged since the previous study. Electronically Signed   By: Nelson Chimes M.D.   On: 02/25/2016 18:47     EKG: Independently reviewed. Sinus rhythm, QTC 451, T-wave inversion in I/aVL, V1-V5   Assessment/Plan Principal Problem:   Chest pain Active Problems:   Tobacco abuse   Hyperlipidemia   CAD (coronary artery  disease)   Hypokalemia   Leukocytosis  Chest pain and CAD: No Infiltration on chest x-ray. No signs of DVT, CP is not pleuritic, less likely to have PE. Pt has newly placed Stent, and has worsening CP. Her trop is 0.05, which could be from previously elevated trop, but cannot completely rule out new elevation. Since she has persistent CP which has worsened today, concerning for ACS.  - will place on Tele bed for obs - start IV heparin - cycle CE q6 x3 and repeat her EKG in the am  - Nitroglycerin, Morphine, and aspirin, lipitor  - continue Brilinta - add imdur - Risk factor stratification: will check FLP , UDS and A1C  - 2d echo - check preg test - please call Card in AM - medical record was requested   Hx of Tobacco abuse: pt quit smoking since recent heart attach -Nicotine patch  HLD: Last LDL was not on record -Continue home medications: lipitor -Check FLP  Hypokalemia: K= 3.0 on admission. - Repleted - Check Mg level  Leukocytosis: no signs of infection. CXR negative. Likely due to stress induced to demargination -will check UA -follow up by CBC  Addendum: pt had hypotension with Bp 89/54, unclear etiology. No signs of infection. Might be due to dehydration. -IVF: 1.5 cc NS and then 100 cc/h -check UA   DVT ppx: on IV Heparin    Code Status: Full code Family Communication:  Yes, patient's boyfriend at bed side Disposition Plan:  Anticipate discharge back to previous home environment Consults called:   Admission status: Obs / tele      Date of Service 02/26/2016    Ivor Costa [facility] Pager [phone]  If 7PM-7AM, please contact night-coverage [email or web address] Password [email or web address] 02/26/2016, 2:58 AM

## 2016-02-25 NOTE — ED Notes (Signed)
Admitting Provider at bedside. 

## 2016-02-26 ENCOUNTER — Observation Stay (HOSPITAL_COMMUNITY): Payer: 59

## 2016-02-26 ENCOUNTER — Encounter (HOSPITAL_COMMUNITY): Payer: Self-pay | Admitting: Physician Assistant

## 2016-02-26 DIAGNOSIS — I200 Unstable angina: Secondary | ICD-10-CM | POA: Diagnosis not present

## 2016-02-26 DIAGNOSIS — Z791 Long term (current) use of non-steroidal anti-inflammatories (NSAID): Secondary | ICD-10-CM | POA: Diagnosis not present

## 2016-02-26 DIAGNOSIS — R748 Abnormal levels of other serum enzymes: Secondary | ICD-10-CM | POA: Diagnosis not present

## 2016-02-26 DIAGNOSIS — R079 Chest pain, unspecified: Secondary | ICD-10-CM | POA: Diagnosis present

## 2016-02-26 DIAGNOSIS — D72829 Elevated white blood cell count, unspecified: Secondary | ICD-10-CM | POA: Diagnosis present

## 2016-02-26 DIAGNOSIS — I2510 Atherosclerotic heart disease of native coronary artery without angina pectoris: Secondary | ICD-10-CM | POA: Diagnosis not present

## 2016-02-26 DIAGNOSIS — E349 Endocrine disorder, unspecified: Secondary | ICD-10-CM

## 2016-02-26 DIAGNOSIS — I25110 Atherosclerotic heart disease of native coronary artery with unstable angina pectoris: Secondary | ICD-10-CM | POA: Diagnosis present

## 2016-02-26 DIAGNOSIS — E860 Dehydration: Secondary | ICD-10-CM | POA: Diagnosis present

## 2016-02-26 DIAGNOSIS — E876 Hypokalemia: Secondary | ICD-10-CM | POA: Diagnosis present

## 2016-02-26 DIAGNOSIS — E7800 Pure hypercholesterolemia, unspecified: Secondary | ICD-10-CM | POA: Diagnosis not present

## 2016-02-26 DIAGNOSIS — Z87891 Personal history of nicotine dependence: Secondary | ICD-10-CM | POA: Diagnosis not present

## 2016-02-26 DIAGNOSIS — I959 Hypotension, unspecified: Secondary | ICD-10-CM | POA: Diagnosis present

## 2016-02-26 DIAGNOSIS — I214 Non-ST elevation (NSTEMI) myocardial infarction: Secondary | ICD-10-CM | POA: Diagnosis present

## 2016-02-26 DIAGNOSIS — Z955 Presence of coronary angioplasty implant and graft: Secondary | ICD-10-CM | POA: Diagnosis not present

## 2016-02-26 DIAGNOSIS — E785 Hyperlipidemia, unspecified: Secondary | ICD-10-CM | POA: Diagnosis present

## 2016-02-26 LAB — LIPID PANEL
CHOL/HDL RATIO: 4.8 ratio
Cholesterol: 106 mg/dL (ref 0–200)
HDL: 22 mg/dL — ABNORMAL LOW (ref >40)
LDL Cholesterol: 57 mg/dL (ref 0–99)
Triglycerides: 134 mg/dL (ref <150)
VLDL: 27 mg/dL (ref 0–40)

## 2016-02-26 LAB — CBC
HCT: 37.5 % (ref 36.0–46.0)
Hemoglobin: 12.5 g/dL (ref 12.0–15.0)
MCH: 27.2 pg (ref 26.0–34.0)
MCHC: 33.3 g/dL (ref 30.0–36.0)
MCV: 81.7 fL (ref 78.0–100.0)
PLATELETS: 207 10*3/uL (ref 150–400)
RBC: 4.59 MIL/uL (ref 3.87–5.11)
RDW: 14.7 % (ref 11.5–15.5)
WBC: 10.8 10*3/uL — ABNORMAL HIGH (ref 4.0–10.5)

## 2016-02-26 LAB — URINALYSIS, ROUTINE W REFLEX MICROSCOPIC
Bacteria, UA: NONE SEEN
Bilirubin Urine: NEGATIVE
GLUCOSE, UA: NEGATIVE mg/dL
Hgb urine dipstick: NEGATIVE
KETONES UR: NEGATIVE mg/dL
Nitrite: NEGATIVE
PH: 7 (ref 5.0–8.0)
Protein, ur: NEGATIVE mg/dL
SPECIFIC GRAVITY, URINE: 1.006 (ref 1.005–1.030)

## 2016-02-26 LAB — BASIC METABOLIC PANEL
Anion gap: 7 (ref 5–15)
BUN: <5 mg/dL — ABNORMAL LOW (ref 6–20)
CHLORIDE: 112 mmol/L — AB (ref 101–111)
CO2: 21 mmol/L — AB (ref 22–32)
CREATININE: 0.75 mg/dL (ref 0.44–1.00)
Calcium: 8.5 mg/dL — ABNORMAL LOW (ref 8.9–10.3)
GFR calc Af Amer: >60 mL/min (ref >60)
GFR calc non Af Amer: >60 mL/min (ref >60)
Glucose, Bld: 134 mg/dL — ABNORMAL HIGH (ref 65–99)
Potassium: 3.4 mmol/L — ABNORMAL LOW (ref 3.5–5.1)
SODIUM: 140 mmol/L (ref 135–145)

## 2016-02-26 LAB — PROTIME-INR
INR: 1.18
Prothrombin Time: 15 seconds (ref 11.4–15.2)

## 2016-02-26 LAB — RAPID URINE DRUG SCREEN, HOSP PERFORMED
Amphetamines: NOT DETECTED
BENZODIAZEPINES: NOT DETECTED
Barbiturates: NOT DETECTED
COCAINE: NOT DETECTED
Opiates: NOT DETECTED
Tetrahydrocannabinol: POSITIVE — AB

## 2016-02-26 LAB — HEPARIN LEVEL (UNFRACTIONATED)
HEPARIN UNFRACTIONATED: 0.28 [IU]/mL — AB (ref 0.30–0.70)
Heparin Unfractionated: 0.17 IU/mL — ABNORMAL LOW (ref 0.30–0.70)
Heparin Unfractionated: 0.15 IU/mL — ABNORMAL LOW (ref 0.30–0.70)

## 2016-02-26 LAB — TROPONIN I: TROPONIN I: 0.05 ng/mL — AB (ref <0.03)

## 2016-02-26 LAB — MAGNESIUM
MAGNESIUM: 1.7 mg/dL (ref 1.7–2.4)
Magnesium: 1.7 mg/dL (ref 1.7–2.4)

## 2016-02-26 LAB — ECHOCARDIOGRAM COMPLETE
Height: 62 in
WEIGHTICAEL: 2846.58 [oz_av]

## 2016-02-26 LAB — HCG, QUANTITATIVE, PREGNANCY: hCG, Beta Chain, Quant, S: 6 m[IU]/mL — ABNORMAL HIGH (ref <5)

## 2016-02-26 LAB — D-DIMER, QUANTITATIVE (NOT AT ARMC)

## 2016-02-26 MED ORDER — POTASSIUM CHLORIDE CRYS ER 20 MEQ PO TBCR
40.0000 meq | EXTENDED_RELEASE_TABLET | Freq: Once | ORAL | Status: AC
  Administered 2016-02-26: 40 meq via ORAL
  Filled 2016-02-26: qty 2

## 2016-02-26 MED ORDER — SODIUM CHLORIDE 0.9 % IV SOLN
INTRAVENOUS | Status: DC
  Administered 2016-02-26: 100 mL/h via INTRAVENOUS
  Administered 2016-02-26: 22:00:00 via INTRAVENOUS

## 2016-02-26 MED ORDER — HEPARIN BOLUS VIA INFUSION
1000.0000 [IU] | Freq: Once | INTRAVENOUS | Status: AC
  Administered 2016-02-26: 1000 [IU] via INTRAVENOUS
  Filled 2016-02-26: qty 1000

## 2016-02-26 MED ORDER — SODIUM CHLORIDE 0.9 % IV BOLUS (SEPSIS)
1000.0000 mL | Freq: Once | INTRAVENOUS | Status: AC
  Administered 2016-02-26: 1000 mL via INTRAVENOUS

## 2016-02-26 MED ORDER — SODIUM CHLORIDE 0.9 % WEIGHT BASED INFUSION
1.0000 mL/kg/h | INTRAVENOUS | Status: DC
  Administered 2016-02-27: 1 mL/kg/h via INTRAVENOUS

## 2016-02-26 MED ORDER — HEPARIN BOLUS VIA INFUSION
2000.0000 [IU] | Freq: Once | INTRAVENOUS | Status: AC
  Administered 2016-02-26: 2000 [IU] via INTRAVENOUS
  Filled 2016-02-26: qty 2000

## 2016-02-26 MED ORDER — SODIUM CHLORIDE 0.9 % WEIGHT BASED INFUSION
3.0000 mL/kg/h | INTRAVENOUS | Status: DC
Start: 2016-02-27 — End: 2016-02-27
  Administered 2016-02-27: 3 mL/kg/h via INTRAVENOUS

## 2016-02-26 MED ORDER — SODIUM CHLORIDE 0.9 % IV BOLUS (SEPSIS)
500.0000 mL | Freq: Once | INTRAVENOUS | Status: AC
  Administered 2016-02-26: 500 mL via INTRAVENOUS

## 2016-02-26 MED ORDER — PRASUGREL HCL 10 MG PO TABS
10.0000 mg | ORAL_TABLET | Freq: Every day | ORAL | Status: DC
  Administered 2016-02-26 – 2016-02-27 (×2): 10 mg via ORAL
  Filled 2016-02-26 (×2): qty 1

## 2016-02-26 MED ORDER — HEPARIN (PORCINE) IN NACL 100-0.45 UNIT/ML-% IJ SOLN
1400.0000 [IU]/h | INTRAMUSCULAR | Status: DC
  Filled 2016-02-26: qty 250

## 2016-02-26 MED ORDER — MAGNESIUM SULFATE 2 GM/50ML IV SOLN
2.0000 g | Freq: Once | INTRAVENOUS | Status: AC
  Administered 2016-02-26: 2 g via INTRAVENOUS
  Filled 2016-02-26: qty 50

## 2016-02-26 NOTE — Progress Notes (Signed)
ANTICOAGULATION CONSULT NOTE - Follow Up Consult  Pharmacy Consult for heparin Indication: chest pain/ACS  Labs:  Recent Labs  02/25/16 1817 02/25/16 2214 02/26/16 0215 02/26/16 0932  HGB 14.7  --  12.5  --   HCT 43.2  --  37.5  --   PLT 236  --  207  --   HEPARINUNFRC  --   --  0.17* 0.15*  CREATININE 0.79  --   --   --   TROPONINI 0.05* <0.03 <0.03  --     Assessment: 51yo F with h/o CAD presents with chest pain - to continue on heparin gtt. Heparin level subtherapeutic at 0.15 after re-bolus and rate increase. Per RN, infusion pump was beeping all night long, which may mean the patient was not receiving proper heparin dose. Hgb 14.7>12.5, Plt wnl, no bleeding documented.  Goal of Therapy:  Heparin level 0.3-0.7 units/ml   Plan:  Will re-bolus with heparin 2000 units and continue rate at 1100 units/hr Check heparin level in 6 hours Daily heparin level, CBC Monitor for s/sx of bleeding   Gwenlyn Perking, PharmD PGY1 Pharmacy Resident Pager: [phone] 02/26/2016 11:25 AM

## 2016-02-26 NOTE — Progress Notes (Addendum)
PROGRESS NOTE    Rhonda Brown  [id number] DOB: Jul 17, 1965 DOA: 02/25/2016 PCP: No PCP Per Patient   Outpatient Specialists:     Brief Narrative:  Rhonda Brown is a 51 y.o. female with medical history significant of tobacco abuse, hyperlipidemia, CAD, recent MT, s/p of DES to LAD on 02/13/16, who presents with chest pain.  Patient was recently admitted to [facility] in [place] [phone]) due to MI. She had cardiac cath done, which showed trifurcation lesions involving LAD (95%), diagonal 1 and 2 lesion. She is s/p of DES to LAD 02/13/16. She was discharged on 02/16/16. Pt states that she continues to have chest pressure, SOB and weakness. Today her chest pressure has worsened. It is located in the substernal area, constant, 5 out of 10 in severity, pressure-like, radiating to the left jaw and the neck. It is associated with SOB. She has dry cough, no fever or chills. She has nausea, but no vomiting, abdominal pain. She had diarrhea 2 days ago, which has completely resolved. Currently no diarrhea or loose stool. Patient denies symptoms of UTI, runny nose, sore throat, unilateral weakness. Pt drove back from [place] on 02/14/16, but denies calf pain.    Assessment & Plan:   Principal Problem:   Chest pain Active Problems:   Tobacco abuse   Hyperlipidemia   CAD (coronary artery disease)   Hypokalemia   Elevated troponin   Leukocytosis   Elevated serum hCG   Chest pain and CAD:  Missed 1 dose of brilinta - start IV heparin  - add imdur - Risk factor stratification: will check FLP , UDS and A1C  - 2d echo - check preg test  Hx of Tobacco abuse: pt quit smoking since recent heart attach -Nicotine patch  HLD:  -LDL 57  Hypokalemia:  -replete and recheck  Leukocytosis: no signs of infection. CXR negative. Likely due to stress induced to demargination -will check UA -follow up by CBC  BHCg of 6 -repeat in AM -last draw was stable and not  consistent with pregnancy   DVT prophylaxis:  Fully anticoagulated   Code Status: Full Code   Family Communication: patient  Disposition Plan:  Cath in AM   Consultants:   cards     Subjective: No continued chest pain  Objective: Vitals:   02/26/16 0038 02/26/16 0314 02/26/16 0535 02/26/16 0849  BP: (!) 89/54 (!) 90/55 (!) 93/54 104/60  Pulse: 84 86  86  Resp: 18 18 20    Temp: 98.1 F (36.7 C) 97.7 F (36.5 C) 98.4 F (36.9 C) 97.6 F (36.4 C)  TempSrc: Oral Oral Oral Oral  SpO2: 99% 95% 98% 98%  Weight:      Height:  5\' 2"  (1.575 m)     No intake or output data in the 24 hours ending 02/26/16 1151 Filed Weights   02/25/16 2147  Weight: 80.7 kg (177 lb 14.6 oz)    Examination:  General exam: Appears calm and comfortable  Respiratory system: Clear to auscultation. Respiratory effort normal. Cardiovascular system: S1 & S2 heard, RRR. No JVD, murmurs, rubs, gallops or clicks. No pedal edema. Gastrointestinal system: Abdomen is nondistended, soft and nontender. No organomegaly or masses felt. Normal bowel sounds heard. Central nervous system: Alert and oriented. No focal neurological deficits.     Data Reviewed: I have personally reviewed following labs and imaging studies  CBC:  Recent Labs Lab 02/25/16 1817 02/26/16 0215  WBC 12.7* 10.8*  HGB 14.7 12.5  HCT 43.2 37.5  MCV  81.1 81.7  PLT 236 [id number]   Basic Metabolic Panel:  Recent Labs Lab 02/25/16 1817 02/26/16 0215  NA 137  --   K 3.0*  --   CL 104  --   CO2 20*  --   GLUCOSE 139*  --   BUN 6  --   CREATININE 0.79  --   CALCIUM 9.2  --   MG  --  1.7   GFR: Estimated Creatinine Clearance: 84.6 mL/min (by C-G formula based on SCr of 0.79 mg/dL). Liver Function Tests: No results for input(s): AST, ALT, ALKPHOS, BILITOT, PROT, ALBUMIN in the last 168 hours. No results for input(s): LIPASE, AMYLASE in the last 168 hours. No results for input(s): AMMONIA in the last 168  hours. Coagulation Profile: No results for input(s): INR, PROTIME in the last 168 hours. Cardiac Enzymes:  Recent Labs Lab 02/25/16 1817 02/25/16 2214 02/26/16 0215 02/26/16 0932  TROPONINI 0.05* <0.03 <0.03 0.05*   BNP (last 3 results) No results for input(s): PROBNP in the last 8760 hours. HbA1C: No results for input(s): HGBA1C in the last 72 hours. CBG: No results for input(s): GLUCAP in the last 168 hours. Lipid Profile:  Recent Labs  02/26/16 0215  CHOL 106  HDL 22*  LDLCALC 57  TRIG 134  CHOLHDL 4.8   Thyroid Function Tests: No results for input(s): TSH, T4TOTAL, FREET4, T3FREE, THYROIDAB in the last 72 hours. Anemia Panel: No results for input(s): VITAMINB12, FOLATE, FERRITIN, TIBC, IRON, RETICCTPCT in the last 72 hours. Urine analysis:    Component Value Date/Time   COLORURINE YELLOW 07/03/2015 0238   APPEARANCEUR CLEAR 07/03/2015 0238   LABSPEC 1.028 07/03/2015 0238   PHURINE 5.5 07/03/2015 0238   GLUCOSEU NEGATIVE 07/03/2015 0238   HGBUR NEGATIVE 07/03/2015 0238   BILIRUBINUR NEGATIVE 07/03/2015 0238   KETONESUR NEGATIVE 07/03/2015 0238   PROTEINUR NEGATIVE 07/03/2015 0238   UROBILINOGEN 0.2 05/24/2008 0412   NITRITE NEGATIVE 07/03/2015 0238   LEUKOCYTESUR NEGATIVE 07/03/2015 0238     )No results found for this or any previous visit (from the past 240 hour(s)).    Anti-infectives    None       Radiology Studies: Dg Chest 2 View  Result Date: 02/25/2016 CLINICAL DATA:  Shortness of breath and lethargy over the last 3 weeks. Recent coronary stent placement. EXAM: CHEST  2 VIEW COMPARISON:  12/07/2015 FINDINGS: Heart size is normal. Mediastinal shadows are normal. There is mild linear scarring or atelectasis in the lower lungs, similar to the study of 12/07/2015. No evidence of congestive heart failure or effusion. No bone abnormality. IMPRESSION: Mild linear scarring or atelectasis at the lung bases, unchanged since the previous study.  Electronically Signed   By: Nelson Chimes M.D.   On: 02/25/2016 18:47        Scheduled Meds: . aspirin EC  81 mg Oral Daily  . atorvastatin  80 mg Oral q1800  . heparin  2,000 Units Intravenous Once  . isosorbide mononitrate  30 mg Oral Daily  . metoprolol tartrate  25 mg Oral BID  . nicotine  21 mg Transdermal Daily   Continuous Infusions: . sodium chloride 100 mL/hr (02/26/16 0100)  . heparin 1,100 Units/hr (02/26/16 0336)     LOS: 1 day    Time spent: 40 min    [place], DO [facility] Pager [phone]  If 7PM-7AM, please contact night-coverage [email or web address] Password [email or web address] 02/26/2016, 11:51 AM

## 2016-02-26 NOTE — Progress Notes (Signed)
ANTICOAGULATION CONSULT NOTE - Follow Up Consult  Pharmacy Consult for heparin Indication: chest pain/ACS  Labs:  Recent Labs  02/25/16 1817 02/25/16 2214 02/26/16 0215  HGB 14.7  --  12.5  HCT 43.2  --  37.5  PLT 236  --  207  HEPARINUNFRC  --   --  0.17*  CREATININE 0.79  --   --   TROPONINI 0.05* <0.03  --     Assessment: 50yo female subtherapeutic on heparin with initial dosing for CP, lab drawn <4hr after bolus, would expect higher level.  Goal of Therapy:  Heparin level 0.3-0.7 units/ml   Plan:  Will rebolus with heparin 2000 units and increase gtt by 3 units/kg/hr to 1100 units/hr and check level in 6hr.  Wynona Neat, PharmD, BCPS  02/26/2016,3:32 AM

## 2016-02-26 NOTE — Progress Notes (Signed)
  Echocardiogram 2D Echocardiogram has been performed.  Darlina Sicilian M 02/26/2016, 12:13 PM

## 2016-02-26 NOTE — Consult Note (Signed)
Cardiology Consultation   Patient ID: Rhonda Brown; [id number]; 05-09-1965   Admit date: 02/25/2016 Date of Consult: 02/26/2016  Referring MD:  Dr. Ivor Costa  Patient Care Team: No Pcp Per Patient as PCP - General (General Practice)    Reason for Consultation:  Chest Pain   History of Present Illness: Rhonda Brown is a 51 y.o. female with a hx of CAD s/p myocardial infarction 02/13/16 tx with DES to LAD at [facility] in [place] s/p DES to 95% proximal LAD trifurcation lesion on 02/14/16.  She spent 2 days in the hospital there.  She could not afford Brilinta and was changed to Plavix.  She had more severe chest pain and went back to the hospital and spent 2 more nights.  She did not have a repeat cath.  She was able to get Brilinta filled and remained on this drug after her 2nd DC.  She did follow up with Dr. Sherryl Barters in [place], [place] ([address], [place] [address]; phone [phone]; fax [phone]) after DC and he increased her Metoprolol due to continued chest pain and elevated HR.  Rhonda Brown has continued to feel short of breath and have chest pain since she was initially admitted to the hospital.  She denies any symptoms like her angina that she presented with initially.  This chest pain is different.  It seems to be persistent.  She feels short of breath all the time.  She does note radiation to her L jaw and feels diaphoretic with her symptoms.  Her chest pain and shortness of breath are made worse with minimal activity.  She also notes pleuritic chest pain.  She denies LE edema. She does awaken suddenly out of breath since her myocardial infarction.  She denies syncope.  She denies bleeding issues.  She has had diarrhea recently.  She has a nonproductive cough.  She has quit smoking since her myocardial infarction.    She was admitted last night with continued chest pain.  Her BP was low in the ED last night (89/54).  She was hydrated with IVFs.   Initial Troponin was minimally elevated at 0.05.  Subsequent Troponin levels have been negative (<0.03 - < 0.03).  ECG on admit demonstrated sinus tachycardia, HR 107, TWI in 1, aVL, V2-4, QTc 451 ms.  Other pertinent findings since admission: K 3.0.  UDS + for THC.  HCG Quant 6 (high).     Past Medical History:  Diagnosis Date  . CAD (coronary artery disease)   . Hyperlipidemia   . MI (myocardial infarction)   . Tobacco abuse     Past Surgical History:  Procedure Laterality Date  . APPENDECTOMY    . CORONARY ANGIOPLASTY WITH STENT PLACEMENT    . TONSILLECTOMY    . TUBAL LIGATION       Home Meds: Prior to Admission medications   Medication Sig Start Date End Date Taking? Authorizing Provider  aspirin 81 MG chewable tablet Chew 81 mg by mouth daily.   Yes Historical Provider, MD  atorvastatin (LIPITOR) 80 MG tablet Take 80 mg by mouth at bedtime.   Yes Historical Provider, MD  metoprolol tartrate (LOPRESSOR) 25 MG tablet Take 25 mg by mouth 2 (two) times daily.   Yes Historical Provider, MD  nitroGLYCERIN (NITROSTAT) 0.4 MG SL tablet Place 0.4 mg under the tongue every 5 (five) minutes as needed for chest pain.   Yes Historical Provider, MD  ticagrelor (BRILINTA) 90 MG TABS tablet Take 90 mg by  mouth 2 (two) times daily.   Yes Historical Provider, MD    Current Medications: . aspirin EC  81 mg Oral Daily  . atorvastatin  80 mg Oral q1800  . isosorbide mononitrate  30 mg Oral Daily  . metoprolol tartrate  25 mg Oral BID  . nicotine  21 mg Transdermal Daily  . ticagrelor  90 mg Oral BID     Allergies:   No Known Allergies  Social History:   The patient  reports that she has quit smoking. She smoked 2.00 packs per day. She has never used smokeless tobacco. She reports that she uses drugs, including Marijuana. She reports that she does not drink alcohol.    Family History:   The patient's family history includes Diabetes Mellitus II in her mother and sister; Heart attack in her  father; Renal cancer in her father; Stroke in her mother and sister.   ROS:  Please see the history of present illness.  ROS All other ROS reviewed and negative.      Vital Signs: Blood pressure (!) 93/54, pulse 86, temperature 98.4 F (36.9 C), temperature source Oral, resp. rate 20, height 5\' 2"  (1.575 m), weight 177 lb 14.6 oz (80.7 kg), SpO2 98 %.   PHYSICAL EXAM: General:  Well nourished, well developed, in no acute distress  HEENT: normal Neck: no JVD Endocrine:  No thryomegaly Vascular: No carotid bruits; DP/PT 2+ bilaterally   Cardiac:  normal S1, S2; RRR; no murmur  Lungs:  clear to auscultation bilaterally, no wheezing, rhonchi or rales  Abd: soft, nontender, no hepatomegaly  Ext: no edema Musculoskeletal:  No deformities  Skin: warm and dry  Neuro:  CNs 2-12 intact, no focal abnormalities noted Psych:  Normal affect    EKG:  As above.  No repeat ECG yet today.   Labs:  Recent Labs  02/25/16 1817 02/25/16 2214 02/26/16 0215  TROPONINI 0.05* <0.03 <0.03   Lab Results  Component Value Date   WBC 10.8 (H) 02/26/2016   HGB 12.5 02/26/2016   HCT 37.5 02/26/2016   MCV 81.7 02/26/2016   PLT 207 02/26/2016    Recent Labs Lab 02/25/16 1817  NA 137  K 3.0*  CL 104  CO2 20*  BUN 6  CREATININE 0.79  CALCIUM 9.2  GLUCOSE 139*   Lab Results  Component Value Date   CHOL 106 02/26/2016   HDL 22 (L) 02/26/2016   LDLCALC 57 02/26/2016   TRIG 134 02/26/2016   No results found for: DDIMER  Radiology/Studies:  Dg Chest 2 View  Result Date: 02/25/2016 CLINICAL DATA:  Shortness of breath and lethargy over the last 3 weeks. Recent coronary stent placement. EXAM: CHEST  2 VIEW COMPARISON:  12/07/2015 FINDINGS: Heart size is normal. Mediastinal shadows are normal. There is mild linear scarring or atelectasis in the lower lungs, similar to the study of 12/07/2015. No evidence of congestive heart failure or effusion. No bone abnormality. IMPRESSION: Mild linear  scarring or atelectasis at the lung bases, unchanged since the previous study. Electronically Signed   By: Nelson Chimes M.D.   On: 02/25/2016 18:47     PROBLEM LIST:  Principal Problem:   Chest pain Active Problems:   CAD (coronary artery disease)   Elevated troponin   Hyperlipidemia   Tobacco abuse   Hypokalemia   Leukocytosis   Elevated serum hCG     ASSESSMENT AND PLAN:  1. Chest Pain - Her symptoms are not like her previous angina and sound  suspicious for side effects to Brilinta.  However, she is able to reproduce symptoms with minimal activity and she has some associated symptoms that sound typical for angina.  She also has pleuritic chest pain with 2 recent hospital stays.  The likelihood of pulmonary embolism is low but she should be tested for this as well.    -  Continue Heparin IV, ASA, oral nitrates.  -  Probable cardiac cath tomorrow to evaluate symptoms.  -  DC Brilinta.  -  Start Effient 10 mg QD.  -  Check DDimer  -  Echocardiogram pending.  2. CAD - s/p Recent MI and PCI with DES to LAD at outside hospital.  She mainly had 1V CAD.  Her current symptoms are not like prior angina but symptoms are concerning.  She has essentially ruled out for MI.  She will likely need cardiac cath tomorrow to further evaluate.  -  Continue ASA, Lipitor 80, Metoprolol Tartrate 25 bid, Imdur 30 QD.  -  DC Brilinta and start Effient as noted.  3. HL - Continue Lipitor.  4. Tobacco abuse - She has quit smoking.  5. Elevated HCG - HCG just above upper limit of normal.  Question significance.  Patient s/p tubal ligation.  She tells me she has had 2 subsequent pregnancies (1 tubal and 1 intrauterine) since BTL which required re-do procedure.  She is sexually active. This will need to be sorted out before she undergoes cardiac cath.  Per IM service.    -  Repeat HCG  6. Hypokalemia - K+ replaced.  Get repeat BMET in AM.   Disp - Attending MD to see as well.  Signed, Richardson Dopp,  PA-C  02/26/2016 7:44 AM

## 2016-02-26 NOTE — Progress Notes (Signed)
ANTICOAGULATION CONSULT NOTE - Follow Up Consult  Pharmacy Consult for heparin Indication: chest pain/ACS   Patient Measurements: Height: 5\' 2"  (157.5 cm) Weight: 177 lb 14.6 oz (80.7 kg) IBW/kg (Calculated) : 50.1 Heparin Dosing Weight:  67.9kg   Labs:  Recent Labs  02/25/16 1817 02/25/16 2214 02/26/16 0215 02/26/16 0932 02/26/16 1333 02/26/16 1817  HGB 14.7  --  12.5  --   --   --   HCT 43.2  --  37.5  --   --   --   PLT 236  --  207  --   --   --   LABPROT  --   --   --   --  15.0  --   INR  --   --   --   --  1.18  --   HEPARINUNFRC  --   --  0.17* 0.15*  --  0.28*  CREATININE 0.79  --   --   --  0.75  --   TROPONINI 0.05* <0.03 <0.03 0.05*  --   --     Assessment:  Heparin level = 0.28 on heparin drip 1100 units/hr in this 51yo F with h/o CAD who presented to [facility] on 02/25/16 with chest pain.  This AM, Hgb 14.7>12.5, Plt wnl.  No bleeding documented.  Goal of Therapy:  Heparin level 0.3-0.7 units/ml   Plan:  Will re-bolus with heparin 1000 units and increase heparin rate to 1250 units/hr Check heparin level in 6h Daily heparin level, CBC Monitor for s/sx of bleeding  Nicole Cella, RPh Clinical Pharmacist Pager: [phone] 02/26/2016 7:05 PM

## 2016-02-27 ENCOUNTER — Encounter (HOSPITAL_COMMUNITY)
Admission: EM | Disposition: A | Discharge status: Home / Self Care | Payer: Self-pay | Source: Home / Self Care | Attending: Internal Medicine

## 2016-02-27 DIAGNOSIS — I200 Unstable angina: Secondary | ICD-10-CM

## 2016-02-27 DIAGNOSIS — I2583 Coronary atherosclerosis due to lipid rich plaque: Secondary | ICD-10-CM

## 2016-02-27 HISTORY — PX: CARDIAC CATHETERIZATION: SHX172

## 2016-02-27 LAB — BASIC METABOLIC PANEL
ANION GAP: 6 (ref 5–15)
BUN: <5 mg/dL — ABNORMAL LOW (ref 6–20)
CHLORIDE: 110 mmol/L (ref 101–111)
CO2: 20 mmol/L — AB (ref 22–32)
Calcium: 8.2 mg/dL — ABNORMAL LOW (ref 8.9–10.3)
Creatinine, Ser: 0.73 mg/dL (ref 0.44–1.00)
GFR calc non Af Amer: >60 mL/min (ref >60)
Glucose, Bld: 113 mg/dL — ABNORMAL HIGH (ref 65–99)
POTASSIUM: 3.5 mmol/L (ref 3.5–5.1)
Sodium: 136 mmol/L (ref 135–145)

## 2016-02-27 LAB — HCG, QUANTITATIVE, PREGNANCY: HCG, BETA CHAIN, QUANT, S: 5 m[IU]/mL — AB (ref <5)

## 2016-02-27 LAB — CBC
HEMATOCRIT: 36.8 % (ref 36.0–46.0)
HEMOGLOBIN: 12.4 g/dL (ref 12.0–15.0)
MCH: 27.7 pg (ref 26.0–34.0)
MCHC: 33.7 g/dL (ref 30.0–36.0)
MCV: 82.1 fL (ref 78.0–100.0)
Platelets: 220 10*3/uL (ref 150–400)
RBC: 4.48 MIL/uL (ref 3.87–5.11)
RDW: 14.6 % (ref 11.5–15.5)
WBC: 9.2 10*3/uL (ref 4.0–10.5)

## 2016-02-27 LAB — HEMOGLOBIN A1C
Hgb A1c MFr Bld: 5.8 % — ABNORMAL HIGH (ref 4.8–5.6)
Mean Plasma Glucose: 120 mg/dL

## 2016-02-27 LAB — HEPARIN LEVEL (UNFRACTIONATED)
HEPARIN UNFRACTIONATED: 0.24 [IU]/mL — AB (ref 0.30–0.70)
HEPARIN UNFRACTIONATED: 0.29 [IU]/mL — AB (ref 0.30–0.70)
Heparin Unfractionated: 0.36 IU/mL (ref 0.30–0.70)

## 2016-02-27 SURGERY — LEFT HEART CATH AND CORONARY ANGIOGRAPHY
Anesthesia: LOCAL

## 2016-02-27 MED ORDER — HEPARIN SODIUM (PORCINE) 1000 UNIT/ML IJ SOLN
INTRAMUSCULAR | Status: DC | PRN
  Administered 2016-02-27: 4000 [IU] via INTRAVENOUS

## 2016-02-27 MED ORDER — HEPARIN (PORCINE) IN NACL 2-0.9 UNIT/ML-% IJ SOLN
INTRAMUSCULAR | Status: AC
  Filled 2016-02-27: qty 1000

## 2016-02-27 MED ORDER — VERAPAMIL HCL 2.5 MG/ML IV SOLN
INTRAVENOUS | Status: DC | PRN
  Administered 2016-02-27: 10 mL via INTRA_ARTERIAL

## 2016-02-27 MED ORDER — IOPAMIDOL (ISOVUE-370) INJECTION 76%
INTRAVENOUS | Status: DC | PRN
  Administered 2016-02-27: 30 mL

## 2016-02-27 MED ORDER — SODIUM CHLORIDE 0.9 % IV SOLN: 250.0000 mL | INTRAVENOUS | Status: DC | PRN

## 2016-02-27 MED ORDER — VERAPAMIL HCL 2.5 MG/ML IV SOLN
INTRAVENOUS | Status: AC
  Filled 2016-02-27: qty 2

## 2016-02-27 MED ORDER — FENTANYL CITRATE (PF) 100 MCG/2ML IJ SOLN
INTRAMUSCULAR | Status: DC | PRN
  Administered 2016-02-27: 50 ug via INTRAVENOUS

## 2016-02-27 MED ORDER — LIDOCAINE HCL (PF) 1 % IJ SOLN
INTRAMUSCULAR | Status: AC
  Filled 2016-02-27: qty 30

## 2016-02-27 MED ORDER — PRASUGREL HCL 10 MG PO TABS: 10.0000 mg | ORAL_TABLET | Freq: Every day | ORAL | Status: DC

## 2016-02-27 MED ORDER — HEPARIN BOLUS VIA INFUSION
2000.0000 [IU] | Freq: Once | INTRAVENOUS | Status: AC
  Administered 2016-02-27: 2000 [IU] via INTRAVENOUS
  Filled 2016-02-27: qty 2000

## 2016-02-27 MED ORDER — HEPARIN (PORCINE) IN NACL 2-0.9 UNIT/ML-% IJ SOLN
INTRAMUSCULAR | Status: DC | PRN
  Administered 2016-02-27: 15:00:00

## 2016-02-27 MED ORDER — NITROGLYCERIN 1 MG/10 ML FOR IR/CATH LAB
INTRA_ARTERIAL | Status: AC
  Filled 2016-02-27: qty 10

## 2016-02-27 MED ORDER — FENTANYL CITRATE (PF) 100 MCG/2ML IJ SOLN
INTRAMUSCULAR | Status: AC
  Filled 2016-02-27: qty 2

## 2016-02-27 MED ORDER — HEPARIN SODIUM (PORCINE) 5000 UNIT/ML IJ SOLN: 5000.0000 [IU] | Freq: Three times a day (TID) | INTRAMUSCULAR | Status: DC

## 2016-02-27 MED ORDER — ISOSORBIDE MONONITRATE ER 30 MG PO TB24: 30.0000 mg | ORAL_TABLET | Freq: Every day | ORAL | 0 refills | Status: DC

## 2016-02-27 MED ORDER — HEPARIN SODIUM (PORCINE) 1000 UNIT/ML IJ SOLN
INTRAMUSCULAR | Status: AC
  Filled 2016-02-27: qty 1

## 2016-02-27 MED ORDER — SODIUM CHLORIDE 0.9% FLUSH: 3.0000 mL | Freq: Two times a day (BID) | INTRAVENOUS | Status: DC

## 2016-02-27 MED ORDER — IOPAMIDOL (ISOVUE-370) INJECTION 76%
INTRAVENOUS | Status: AC
  Filled 2016-02-27: qty 100

## 2016-02-27 MED ORDER — SODIUM CHLORIDE 0.9 % IV SOLN
INTRAVENOUS | Status: DC
  Administered 2016-02-27: 125 mL/h via INTRAVENOUS

## 2016-02-27 MED ORDER — LIDOCAINE HCL (PF) 1 % IJ SOLN
INTRAMUSCULAR | Status: DC | PRN
  Administered 2016-02-27: 2 mL

## 2016-02-27 MED ORDER — MIDAZOLAM HCL 2 MG/2ML IJ SOLN
INTRAMUSCULAR | Status: DC | PRN
  Administered 2016-02-27: 1 mg via INTRAVENOUS

## 2016-02-27 MED ORDER — MIDAZOLAM HCL 2 MG/2ML IJ SOLN
INTRAMUSCULAR | Status: AC
Start: 2016-02-27 — End: 2016-02-27
  Filled 2016-02-27: qty 2

## 2016-02-27 MED ORDER — SODIUM CHLORIDE 0.9% FLUSH: 3.0000 mL | INTRAVENOUS | Status: DC | PRN

## 2016-02-27 MED FILL — PRASUGREL 10 MG TABLET: 10 | 30 days supply | Qty: 30 | Fill #0

## 2016-02-27 SURGICAL SUPPLY — 9 items
CATH IMPULSE 5F ANG/FL3.5 (CATHETERS) ×2 IMPLANT
DEVICE RAD COMP TR BAND LRG (VASCULAR PRODUCTS) ×2 IMPLANT
GLIDESHEATH SLEND SS 6F .021 (SHEATH) ×2 IMPLANT
GUIDEWIRE INQWIRE 1.5J.035X260 (WIRE) ×1 IMPLANT
INQWIRE 1.5J .035X260CM (WIRE) ×2
KIT HEART LEFT (KITS) ×2 IMPLANT
PACK CARDIAC CATHETERIZATION (CUSTOM PROCEDURE TRAY) ×2 IMPLANT
TRANSDUCER W/STOPCOCK (MISCELLANEOUS) ×2 IMPLANT
TUBING CIL FLEX 10 FLL-RA (TUBING) ×2 IMPLANT

## 2016-02-27 NOTE — Progress Notes (Signed)
Patient Name: Rhonda Brown Date of Encounter: 02/27/2016  Primary Cardiologist: None  Hospital Problem List     Principal Problem:   Chest pain Active Problems:   Tobacco abuse   Hyperlipidemia   CAD (coronary artery disease)   Hypokalemia   Elevated troponin   Leukocytosis   Elevated serum hCG     Subjective   No further CP  Inpatient Medications    Scheduled Meds: . aspirin EC  81 mg Oral Daily  . atorvastatin  80 mg Oral q1800  . isosorbide mononitrate  30 mg Oral Daily  . metoprolol tartrate  25 mg Oral BID  . nicotine  21 mg Transdermal Daily  . prasugrel  10 mg Oral Daily   Continuous Infusions: . sodium chloride Stopped (02/27/16 0525)  . sodium chloride 1 mL/kg/hr (02/27/16 0644)  . heparin 1,400 Units/hr (02/27/16 0900)   PRN Meds: acetaminophen, ALPRAZolam, morphine injection, nitroGLYCERIN, ondansetron (ZOFRAN) IV, zolpidem   Vital Signs    Vitals:   02/26/16 1724 02/26/16 2100 02/26/16 2217 02/27/16 0500  BP: 100/73 115/78 112/85 (!) 93/50  Pulse: 72 77 84 69  Resp:      Temp: 97.8 F (36.6 C) 97.9 F (36.6 C)  97.9 F (36.6 C)  TempSrc: Oral   Oral  SpO2: 99% 97%  98%  Weight:      Height:        Intake/Output Summary (Last 24 hours) at 02/27/16 0918 Last data filed at 02/27/16 0900  Gross per 24 hour  Intake          1922.77 ml  Output                0 ml  Net          1922.77 ml   Filed Weights   02/25/16 2147  Weight: 177 lb 14.6 oz (80.7 kg)    Physical Exam    GEN: Well nourished, well developed, in no acute distress.  HEENT: Grossly normal.  Neck: Supple, no JVD, carotid bruits, or masses. Cardiac: RRR, no murmurs, rubs, or gallops. No clubbing, cyanosis, edema.  Radials/DP/PT 2+ and equal bilaterally.  Respiratory:  Respirations regular and unlabored, clear to auscultation bilaterally. GI: Soft, nontender, nondistended, BS + x 4. MS: no deformity or atrophy. Skin: warm and dry, no rash. Neuro:  Strength and  sensation are intact. Psych: AAOx3.  Normal affect.  Labs    CBC  Recent Labs  02/26/16 0215 02/27/16 0159  WBC 10.8* 9.2  HGB 12.5 12.4  HCT 37.5 36.8  MCV 81.7 82.1  PLT 207 [id number]   Basic Metabolic Panel  Recent Labs  02/26/16 0215 02/26/16 1333 02/27/16 0159  NA  --  140 136  K  --  3.4* 3.5  CL  --  112* 110  CO2  --  21* 20*  GLUCOSE  --  134* 113*  BUN  --  <5* <5*  CREATININE  --  0.75 0.73  CALCIUM  --  8.5* 8.2*  MG 1.7 1.7  --    Liver Function Tests No results for input(s): AST, ALT, ALKPHOS, BILITOT, PROT, ALBUMIN in the last 72 hours. No results for input(s): LIPASE, AMYLASE in the last 72 hours. Cardiac Enzymes  Recent Labs  02/25/16 2214 02/26/16 0215 02/26/16 0932  TROPONINI <0.03 <0.03 0.05*   BNP Invalid input(s): POCBNP D-Dimer  Recent Labs  02/26/16 1333  DDIMER <0.27   Hemoglobin A1C  Recent Labs  02/26/16 0215  HGBA1C  5.8*   Fasting Lipid Panel  Recent Labs  02/26/16 0215  CHOL 106  HDL 22*  LDLCALC 57  TRIG 134  CHOLHDL 4.8   Thyroid Function Tests No results for input(s): TSH, T4TOTAL, T3FREE, THYROIDAB in the last 72 hours.  Invalid input(s): FREET3  Telemetry    NSR - Personally Reviewed  ECG    NSR - Personally Reviewed  Radiology    Dg Chest 2 View  Result Date: 02/25/2016 CLINICAL DATA:  Shortness of breath and lethargy over the last 3 weeks. Recent coronary stent placement. EXAM: CHEST  2 VIEW COMPARISON:  12/07/2015 FINDINGS: Heart size is normal. Mediastinal shadows are normal. There is mild linear scarring or atelectasis in the lower lungs, similar to the study of 12/07/2015. No evidence of congestive heart failure or effusion. No bone abnormality. IMPRESSION: Mild linear scarring or atelectasis at the lung bases, unchanged since the previous study. Electronically Signed   By: Nelson Chimes M.D.   On: 02/25/2016 18:47    Cardiac Studies   pending  Patient Profile     51yo WF admitted last  month with NSTEMI in [place] and underwent cath with PCI of the LAD with DES and placed on Brilinta.  She missed one dose of Brilinta due to cost and ended up back in the hospital due to recurrent CP but no repeat cath done.  She got Brilinta filled and has not missed any doses since then but has continued to have CP that is somewhat different from her prior angina.  It is worse with deep breathing but does radiate into her left jaw which is similar to her pain with her MI  Assessment & Plan    1. Chest Pain - Her symptoms are not like her previous angina and sound suspicious for side effects to Brilinta.  However, she is able to reproduce symptoms with minimal activity and she has some associated symptoms that sound typical for angina.  She also has pleuritic chest pain with 2 recent hospital stays.  D-Dimer is negative.             -  Continue Heparin IV, ASA, oral nitrates.             -  Plan for cath today to reevaluate coronary anatomy.             -  Brilinta stopped due to possible side effects as well as cost.             -  Continue Effient 10 mg QD.             -  Echocardiogram pending.  2. CAD - s/p Recent MI and PCI with DES to LAD at outside hospital.  She mainly had 1V CAD.  Her current symptoms are not like prior angina but symptoms are concerning.  She has essentially ruled out for MI.               -  Continue ASA, Lipitor 80, Metoprolol Tartrate 25 bid, Imdur 30 QD, Effient 10mg  daily.  3. HL - Continue Lipitor.  4. Tobacco abuse - She has quit smoking.  5. Elevated HCG - HCG just above upper limit of normal.  Question significance.  Patient s/p tubal ligation.  She tells me she has had 2 subsequent pregnancies (1 tubal and 1 intrauterine) since BTL which required re-do procedure.  She is sexually active. This will need to be sorted out before she undergoes cardiac cath.  Per IM service.               -  Repeat HCG at 5  6. Hypokalemia - K+ replaced but still borderline low.   Replete this am.   Signed, Fransico Him, MD  02/27/2016, 9:18 AM

## 2016-02-27 NOTE — Care Management Note (Signed)
Case Management Note  Patient Details  Name: Rhonda Brown MRN: [id number] Date of Birth: 1965-06-16  Subjective/Objective: Pt presented for Chest Pain. Pt is from home and plans to return home. Pt has insurance however co pay for effient is 277.26 and pt cannot afford.    S/W [facility] [place] @ [facility] # [phone]   1. EFFIENT 2.5 MG PO BID  30 / 60 TAB  NOT ON FORMULARY   2 EFFIENT 5 MG BID  30 /60 TAB   COVER- YES  CO-PAY- $ 277.26  PRIOR APPROVAL - NO  [facility]  MAIL-ORDER SAME                     Action/Plan: CM did speak with pt in regards to affordability. CM did call the outpatient [place] pharmacy and effient is available and co pay will be 25.00 cash price. CM did ask staff RN to fax Rx and unit Secretary to pick medication up. No further needs from CM at this time.  Expected Discharge Date:                  Expected Discharge Plan:  Home/Self Care  In-House Referral:  NA  Discharge planning Services  CM Consult, Medication Assistance  Post Acute Care Choice:  NA Choice offered to:  NA  DME Arranged:  N/A DME Agency:  NA  HH Arranged:  NA HH Agency:  NA  Status of Service:  Completed, signed off  If discussed at [place] of Stay Meetings, dates discussed:    Additional Comments:  Bethena Roys, RN 02/27/2016, 5:17 PM

## 2016-02-27 NOTE — Progress Notes (Signed)
ANTICOAGULATION CONSULT NOTE - Follow Up Consult  Pharmacy Consult for Heparin Indication: chest pain/ACS  No Known Allergies  Patient Measurements: Height: 5\' 2"  (157.5 cm) Weight:  (refused weight stated 'could come back in two hours') IBW/kg (Calculated) : 50.1  Vital Signs: Temp: 97.9 F (36.6 C) (01/02 0500) Temp Source: Oral (01/02 0500) BP: 93/50 (01/02 0500) Pulse Rate: 69 (01/02 0500)  Labs:  Recent Labs  02/25/16 1817 02/25/16 2214  02/26/16 0215 02/26/16 0932 02/26/16 1333  02/27/16 0159 02/27/16 0755 02/27/16 1002  HGB 14.7  --   --  12.5  --   --   --  12.4  --   --   HCT 43.2  --   --  37.5  --   --   --  36.8  --   --   PLT 236  --   --  207  --   --   --  220  --   --   LABPROT  --   --   --   --   --  15.0  --   --   --   --   INR  --   --   --   --   --  1.18  --   --   --   --   HEPARINUNFRC  --   --   < > 0.17* 0.15*  --   < > 0.24* 0.29* 0.36  CREATININE 0.79  --   --   --   --  0.75  --  0.73  --   --   TROPONINI 0.05* <0.03  --  <0.03 0.05*  --   --   --   --   --   < > = values in this interval not displayed.  Estimated Creatinine Clearance: 84.6 mL/min (by C-G formula based on SCr of 0.73 mg/dL).   Medications:  Scheduled:  . aspirin EC  81 mg Oral Daily  . atorvastatin  80 mg Oral q1800  . isosorbide mononitrate  30 mg Oral Daily  . metoprolol tartrate  25 mg Oral BID  . nicotine  21 mg Transdermal Daily  . prasugrel  10 mg Oral Daily    Assessment: 51yo female with hx CAD, admitted with ACS.  Heparin adjusted last PM with subsequent AM lab drawn late and improved but subtherapeutic.  Heparin level then repeated as ordered at appropriate time and therapeutic.  Hg and pltc wnl.  No bleeding noted.   Goal of Therapy:  Heparin level 0.3-0.7 Monitor platelets by anticoagulation protocol: Yes   Plan:  Continue heparin 1400 units/hr Repeat heparin level 6hr, to verify therapeutic x 2 Continue to monitor for s/s of  bleeding   Gracy Bruins, [facility]

## 2016-02-27 NOTE — Interval H&P Note (Signed)
History and Physical Interval Note:  02/27/2016 2:42 PM  Rhonda Brown  has presented today for cardiac catheterization, with the diagnosis of unstable angina. The various methods of treatment have been discussed with the patient and family. After consideration of risks, benefits and other options for treatment, the patient has consented to  Procedure(s): Left Heart Cath and Coronary Angiography (N/A) as a surgical intervention .  The patient's history has been reviewed, patient examined, no change in status, stable for surgery.  I have reviewed the patient's chart and labs.  Questions were answered to the patient's satisfaction.    Cath Lab Visit (complete for each Cath Lab visit)  Clinical Evaluation Leading to the Procedure:   ACS: Yes.   (unstable angina)  Non-ACS:  N/A  Aliz Meritt

## 2016-02-27 NOTE — Progress Notes (Signed)
Cath showed widely patent LAD stent and mild non obstructive CAD of the diag jailed by the LAD stent and mild disease in the LCX.  Medical therapy recommended.  Continue ASA/Effient.  OK from cardiac standpoint to discharge home.  Will sign off.  We will arrange followup in our office.

## 2016-02-27 NOTE — H&P (View-Only) (Signed)
Patient Name: Rhonda Brown Date of Encounter: 02/27/2016  Primary Cardiologist: None  Hospital Problem List     Principal Problem:   Chest pain Active Problems:   Tobacco abuse   Hyperlipidemia   CAD (coronary artery disease)   Hypokalemia   Elevated troponin   Leukocytosis   Elevated serum hCG     Subjective   No further CP  Inpatient Medications    Scheduled Meds: . aspirin EC  81 mg Oral Daily  . atorvastatin  80 mg Oral q1800  . isosorbide mononitrate  30 mg Oral Daily  . metoprolol tartrate  25 mg Oral BID  . nicotine  21 mg Transdermal Daily  . prasugrel  10 mg Oral Daily   Continuous Infusions: . sodium chloride Stopped (02/27/16 0525)  . sodium chloride 1 mL/kg/hr (02/27/16 0644)  . heparin 1,400 Units/hr (02/27/16 0900)   PRN Meds: acetaminophen, ALPRAZolam, morphine injection, nitroGLYCERIN, ondansetron (ZOFRAN) IV, zolpidem   Vital Signs    Vitals:   02/26/16 1724 02/26/16 2100 02/26/16 2217 02/27/16 0500  BP: 100/73 115/78 112/85 (!) 93/50  Pulse: 72 77 84 69  Resp:      Temp: 97.8 F (36.6 C) 97.9 F (36.6 C)  97.9 F (36.6 C)  TempSrc: Oral   Oral  SpO2: 99% 97%  98%  Weight:      Height:        Intake/Output Summary (Last 24 hours) at 02/27/16 0918 Last data filed at 02/27/16 0900  Gross per 24 hour  Intake          1922.77 ml  Output                0 ml  Net          1922.77 ml   Filed Weights   02/25/16 2147  Weight: 177 lb 14.6 oz (80.7 kg)    Physical Exam    GEN: Well nourished, well developed, in no acute distress.  HEENT: Grossly normal.  Neck: Supple, no JVD, carotid bruits, or masses. Cardiac: RRR, no murmurs, rubs, or gallops. No clubbing, cyanosis, edema.  Radials/DP/PT 2+ and equal bilaterally.  Respiratory:  Respirations regular and unlabored, clear to auscultation bilaterally. GI: Soft, nontender, nondistended, BS + x 4. MS: no deformity or atrophy. Skin: warm and dry, no rash. Neuro:  Strength and  sensation are intact. Psych: AAOx3.  Normal affect.  Labs    CBC  Recent Labs  02/26/16 0215 02/27/16 0159  WBC 10.8* 9.2  HGB 12.5 12.4  HCT 37.5 36.8  MCV 81.7 82.1  PLT 207 [id number]   Basic Metabolic Panel  Recent Labs  02/26/16 0215 02/26/16 1333 02/27/16 0159  NA  --  140 136  K  --  3.4* 3.5  CL  --  112* 110  CO2  --  21* 20*  GLUCOSE  --  134* 113*  BUN  --  <5* <5*  CREATININE  --  0.75 0.73  CALCIUM  --  8.5* 8.2*  MG 1.7 1.7  --    Liver Function Tests No results for input(s): AST, ALT, ALKPHOS, BILITOT, PROT, ALBUMIN in the last 72 hours. No results for input(s): LIPASE, AMYLASE in the last 72 hours. Cardiac Enzymes  Recent Labs  02/25/16 2214 02/26/16 0215 02/26/16 0932  TROPONINI <0.03 <0.03 0.05*   BNP Invalid input(s): POCBNP D-Dimer  Recent Labs  02/26/16 1333  DDIMER <0.27   Hemoglobin A1C  Recent Labs  02/26/16 0215  HGBA1C  5.8*   Fasting Lipid Panel  Recent Labs  02/26/16 0215  CHOL 106  HDL 22*  LDLCALC 57  TRIG 134  CHOLHDL 4.8   Thyroid Function Tests No results for input(s): TSH, T4TOTAL, T3FREE, THYROIDAB in the last 72 hours.  Invalid input(s): FREET3  Telemetry    NSR - Personally Reviewed  ECG    NSR - Personally Reviewed  Radiology    Dg Chest 2 View  Result Date: 02/25/2016 CLINICAL DATA:  Shortness of breath and lethargy over the last 3 weeks. Recent coronary stent placement. EXAM: CHEST  2 VIEW COMPARISON:  12/07/2015 FINDINGS: Heart size is normal. Mediastinal shadows are normal. There is mild linear scarring or atelectasis in the lower lungs, similar to the study of 12/07/2015. No evidence of congestive heart failure or effusion. No bone abnormality. IMPRESSION: Mild linear scarring or atelectasis at the lung bases, unchanged since the previous study. Electronically Signed   By: Nelson Chimes M.D.   On: 02/25/2016 18:47    Cardiac Studies   pending  Patient Profile     51yo WF admitted last  month with NSTEMI in [place] and underwent cath with PCI of the LAD with DES and placed on Brilinta.  She missed one dose of Brilinta due to cost and ended up back in the hospital due to recurrent CP but no repeat cath done.  She got Brilinta filled and has not missed any doses since then but has continued to have CP that is somewhat different from her prior angina.  It is worse with deep breathing but does radiate into her left jaw which is similar to her pain with her MI  Assessment & Plan    1. Chest Pain - Her symptoms are not like her previous angina and sound suspicious for side effects to Brilinta.  However, she is able to reproduce symptoms with minimal activity and she has some associated symptoms that sound typical for angina.  She also has pleuritic chest pain with 2 recent hospital stays.  D-Dimer is negative.             -  Continue Heparin IV, ASA, oral nitrates.             -  Plan for cath today to reevaluate coronary anatomy.             -  Brilinta stopped due to possible side effects as well as cost.             -  Continue Effient 10 mg QD.             -  Echocardiogram pending.  2. CAD - s/p Recent MI and PCI with DES to LAD at outside hospital.  She mainly had 1V CAD.  Her current symptoms are not like prior angina but symptoms are concerning.  She has essentially ruled out for MI.               -  Continue ASA, Lipitor 80, Metoprolol Tartrate 25 bid, Imdur 30 QD, Effient 10mg  daily.  3. HL - Continue Lipitor.  4. Tobacco abuse - She has quit smoking.  5. Elevated HCG - HCG just above upper limit of normal.  Question significance.  Patient s/p tubal ligation.  She tells me she has had 2 subsequent pregnancies (1 tubal and 1 intrauterine) since BTL which required re-do procedure.  She is sexually active. This will need to be sorted out before she undergoes cardiac cath.  Per IM service.               -  Repeat HCG at 5  6. Hypokalemia - K+ replaced but still borderline low.   Replete this am.   Signed, Fransico Him, MD  02/27/2016, 9:18 AM

## 2016-02-27 NOTE — Progress Notes (Signed)
ANTICOAGULATION CONSULT NOTE - Follow Up Consult  Pharmacy Consult for heparin Indication: chest pain/ACS   Patient Measurements: Height: 5\' 2"  (157.5 cm) Weight: 177 lb 14.6 oz (80.7 kg) IBW/kg (Calculated) : 50.1 Heparin Dosing Weight:  68 kg   Labs:  Recent Labs  02/25/16 1817 02/25/16 2214  02/26/16 0215 02/26/16 0932 02/26/16 1333 02/26/16 1817 02/27/16 0159  HGB 14.7  --   --  12.5  --   --   --  12.4  HCT 43.2  --   --  37.5  --   --   --  36.8  PLT 236  --   --  207  --   --   --  220  LABPROT  --   --   --   --   --  15.0  --   --   INR  --   --   --   --   --  1.18  --   --   HEPARINUNFRC  --   --   < > 0.17* 0.15*  --  0.28* 0.24*  CREATININE 0.79  --   --   --   --  0.75  --  0.73  TROPONINI 0.05* <0.03  --  <0.03 0.05*  --   --   --   < > = values in this interval not displayed.  Assessment: Heparin level = 0.28 on heparin drip 1100 units/hr in this 51yo F with h/o CAD on heparin for r/o ACS. Heparin level remains subtherapeutic (0.24) on gtt at 1250 units/hr. Heparin level actually went down after earlier rate increase. CBC stable. No issues with line or bleeding reported per RN. Probable cath today.  Goal of Therapy:  Heparin level 0.3-0.7 units/ml   Plan:  Rebolus heparin 2000 units Increase heparin rate to 1400 units/hr Check heparin level in [place], PharmD, BCPS Clinical pharmacist, pager [phone] 02/27/2016 2:44 AM

## 2016-02-27 NOTE — Progress Notes (Signed)
PROGRESS NOTE    Rhonda Brown  [id number] DOB: March 19, 1965 DOA: 02/25/2016 PCP: No PCP Per Patient   Outpatient Specialists:     Brief Narrative:  Rhonda Brown is a 51 y.o. female with medical history significant of tobacco abuse, hyperlipidemia, CAD, recent MT, s/p of DES to LAD on 02/13/16, who presents with chest pain.  Patient was recently admitted to [facility] in [place] ([phone]) due to MI. She had cardiac cath done, which showed trifurcation lesions involving LAD (95%), diagonal 1 and 2 lesion. She is s/p of DES to LAD 02/13/16. She was discharged on 02/16/16. Pt states that she continues to have chest pressure, SOB and weakness. Today her chest pressure has worsened. It is located in the substernal area, constant, 5 out of 10 in severity, pressure-like, radiating to the left jaw and the neck. It is associated with SOB. She has dry cough, no fever or chills. She has nausea, but no vomiting, abdominal pain. She had diarrhea 2 days ago, which has completely resolved. Currently no diarrhea or loose stool. Patient denies symptoms of UTI, runny nose, sore throat, unilateral weakness. Pt drove back from [place] on 02/14/16, but denies calf pain.    Assessment & Plan:   Principal Problem:   Chest pain Active Problems:   Tobacco abuse   Hyperlipidemia   CAD (coronary artery disease)   Hypokalemia   Elevated troponin   Leukocytosis   Elevated serum hCG   Chest pain and CAD:  Missed 1 dose of brilinta - start IV heparin  - add imdur - Risk factor stratification:  FLP: 57 , UDS: + marijuana and A1C: 5.8 - 2d echo: Left ventricle: The cavity size was normal. Wall thickness was   increased in a pattern of mild LVH. Systolic function was normal.   The estimated ejection fraction was in the range of 55% to 60%.   Wall motion was normal; there were no regional wall motion   abnormalities.  Hx of Tobacco abuse: pt quit smoking since recent heart  attach -Nicotine patch  HLD:  -LDL 57  Hypokalemia:  -replete and recheck  Leukocytosis: no signs of infection. CXR negative. Likely due to stress induced to demargination -resolved  BHCg of 6 -repeat lower  -not consistent with pregnancy -ok for cath   DVT prophylaxis:  Fully anticoagulated   Code Status: Full Code   Family Communication: patient  Disposition Plan:  Cath in AM   Consultants:   cards     Subjective: No chest pain Upset upon hearing family was being deployed overseas  Objective: Vitals:   02/26/16 1724 02/26/16 2100 02/26/16 2217 02/27/16 0500  BP: 100/73 115/78 112/85 (!) 93/50  Pulse: 72 77 84 69  Resp:      Temp: 97.8 F (36.6 C) 97.9 F (36.6 C)  97.9 F (36.6 C)  TempSrc: Oral   Oral  SpO2: 99% 97%  98%  Weight:      Height:        Intake/Output Summary (Last 24 hours) at 02/27/16 1041 Last data filed at 02/27/16 1000  Gross per 24 hour  Intake          2003.47 ml  Output                0 ml  Net          2003.47 ml   Filed Weights   02/25/16 2147  Weight: 80.7 kg (177 lb 14.6 oz)    Examination:  General exam:  Appears calm and comfortable  Respiratory system: Clear to auscultation. Respiratory effort normal. Cardiovascular system: S1 & S2 heard, RRR. No JVD, murmurs, rubs, gallops or clicks. No pedal edema. Gastrointestinal system: Abdomen is nondistended, soft and nontender. No organomegaly or masses felt. Normal bowel sounds heard. Central nervous system: Alert and oriented. No focal neurological deficits.     Data Reviewed: I have personally reviewed following labs and imaging studies  CBC:  Recent Labs Lab 02/25/16 1817 02/26/16 0215 02/27/16 0159  WBC 12.7* 10.8* 9.2  HGB 14.7 12.5 12.4  HCT 43.2 37.5 36.8  MCV 81.1 81.7 82.1  PLT 236 207 [id number]   Basic Metabolic Panel:  Recent Labs Lab 02/25/16 1817 02/26/16 0215 02/26/16 1333 02/27/16 0159  NA 137  --  140 136  K 3.0*  --  3.4* 3.5   CL 104  --  112* 110  CO2 20*  --  21* 20*  GLUCOSE 139*  --  134* 113*  BUN 6  --  <5* <5*  CREATININE 0.79  --  0.75 0.73  CALCIUM 9.2  --  8.5* 8.2*  MG  --  1.7 1.7  --    GFR: Estimated Creatinine Clearance: 84.6 mL/min (by C-G formula based on SCr of 0.73 mg/dL). Liver Function Tests: No results for input(s): AST, ALT, ALKPHOS, BILITOT, PROT, ALBUMIN in the last 168 hours. No results for input(s): LIPASE, AMYLASE in the last 168 hours. No results for input(s): AMMONIA in the last 168 hours. Coagulation Profile:  Recent Labs Lab 02/26/16 1333  INR 1.18   Cardiac Enzymes:  Recent Labs Lab 02/25/16 1817 02/25/16 2214 02/26/16 0215 02/26/16 0932  TROPONINI 0.05* <0.03 <0.03 0.05*   BNP (last 3 results) No results for input(s): PROBNP in the last 8760 hours. HbA1C:  Recent Labs  02/26/16 0215  HGBA1C 5.8*   CBG: No results for input(s): GLUCAP in the last 168 hours. Lipid Profile:  Recent Labs  02/26/16 0215  CHOL 106  HDL 22*  LDLCALC 57  TRIG 134  CHOLHDL 4.8   Thyroid Function Tests: No results for input(s): TSH, T4TOTAL, FREET4, T3FREE, THYROIDAB in the last 72 hours. Anemia Panel: No results for input(s): VITAMINB12, FOLATE, FERRITIN, TIBC, IRON, RETICCTPCT in the last 72 hours. Urine analysis:    Component Value Date/Time   COLORURINE STRAW (A) 02/26/2016 1825   APPEARANCEUR CLEAR 02/26/2016 1825   LABSPEC 1.006 02/26/2016 1825   PHURINE 7.0 02/26/2016 1825   GLUCOSEU NEGATIVE 02/26/2016 1825   HGBUR NEGATIVE 02/26/2016 1825   BILIRUBINUR NEGATIVE 02/26/2016 1825   KETONESUR NEGATIVE 02/26/2016 1825   PROTEINUR NEGATIVE 02/26/2016 1825   UROBILINOGEN 0.2 05/24/2008 0412   NITRITE NEGATIVE 02/26/2016 1825   LEUKOCYTESUR TRACE (A) 02/26/2016 1825     )No results found for this or any previous visit (from the past 240 hour(s)).    Anti-infectives    None       Radiology Studies: Dg Chest 2 View  Result Date:  02/25/2016 CLINICAL DATA:  Shortness of breath and lethargy over the last 3 weeks. Recent coronary stent placement. EXAM: CHEST  2 VIEW COMPARISON:  12/07/2015 FINDINGS: Heart size is normal. Mediastinal shadows are normal. There is mild linear scarring or atelectasis in the lower lungs, similar to the study of 12/07/2015. No evidence of congestive heart failure or effusion. No bone abnormality. IMPRESSION: Mild linear scarring or atelectasis at the lung bases, unchanged since the previous study. Electronically Signed   By: Jan Fireman.D.  On: 02/25/2016 18:47        Scheduled Meds: . aspirin EC  81 mg Oral Daily  . atorvastatin  80 mg Oral q1800  . isosorbide mononitrate  30 mg Oral Daily  . metoprolol tartrate  25 mg Oral BID  . nicotine  21 mg Transdermal Daily  . prasugrel  10 mg Oral Daily   Continuous Infusions: . sodium chloride Stopped (02/27/16 0525)  . sodium chloride 1 mL/kg/hr (02/27/16 0644)  . heparin 1,400 Units/hr (02/27/16 1000)     LOS: 2 days    Time spent: 96 min    [place], DO [facility] Pager [phone]  If 7PM-7AM, please contact night-coverage [email or web address] Password [email or web address] 02/27/2016, 10:41 AM

## 2016-02-28 ENCOUNTER — Encounter (HOSPITAL_COMMUNITY): Payer: Self-pay | Admitting: Internal Medicine

## 2016-02-28 NOTE — Discharge Summary (Signed)
Physician Discharge Summary  Rhonda Brown [id number] DOB: [birth year] DOA: 02/25/2016  PCP: No PCP Per Patient  Admit date: 02/25/2016 Discharge date: 02/28/2016   Recommendations for Outpatient Follow-Up:   1. Outpatient gyn follow up 2. Cardiology follow up arranged as well   Discharge Diagnosis:   Principal Problem:   Chest pain Active Problems:   Tobacco abuse   Hyperlipidemia   CAD (coronary artery disease)   Hypokalemia   Elevated troponin   Leukocytosis   Elevated serum hCG   Discharge disposition:  Home. :  Discharge Condition: Improved.  Diet recommendation: Low sodium, heart healthy.   Wound care: None.   History of Present Illness:   Rhonda Brown is a 51 y.o. female with medical history significant of tobacco abuse, hyperlipidemia, CAD, recent MT, s/p of DES to LAD on 02/13/16, who presents with chest pain.  Patient was recently admitted to [facility] in [place] [phone]) due to MI. She had cardiac cath done, which showed trifurcation lesions Involving LAD (95%), diagonal 1 and 2 lesion. She is s/p of DES to LAD 02/13/16. She was discharged on 02/16/16. Pt states that she continues to have chest pressure, SOB and weakness. Today her chest pressure has worsened. It is located in the substernal area, constant, 5 out of 10 in severity, pressure-like, radiating to the left jaw and the neck. It is associated with SOB. She has dry cough, no fever or chills. She has nausea, but no vomiting, abdominal pain. She had diarrhea 2 days ago, which has completely resolved. Currently no diarrhea or loose stool. Patient denies symptoms of UTI, runny nose, sore throat, unilateral weakness. Pt drove back from [place] on 02/14/16, but denies calf pain.   Hospital Course by Problem:   Chest pain and CAD:  S/p cath: Cath showed widely patent LAD stent and mild non obstructive CAD of the diag jailed by the LAD stent and mild disease in the LCX.  Medical  therapy recommended.  Continue ASA/Effient - add imdur - Risk factor stratification:  FLP: 57 , UDS: + marijuana and A1C: 5.8 - 2d echo: Left ventricle: The cavity size was normal. Wall thickness was increased in a pattern of mild LVH. Systolic function was normal. The estimated ejection fraction was in the range of 55% to 60%. Wall motion was normal; there were no regional wall motion abnormalities.  Hx of Tobacco abuse:pt quit smoking since recent heart attach -Nicotine patch  HLD: -LDL 57  Hypokalemia:  -replete and recheck  Leukocytosis:no signs of infection. CXR negative. Likely due to stress induced to demargination -resolved  BHCg of 6 -repeat lower  -not consistent with pregnancy -outpatient gyn follow up     Medical Consultants:    cards   Discharge Exam:   Vitals:   02/27/16 1503 02/27/16 1508  BP: 124/83   Pulse: 85 (!) 0  Resp: 15 (!) 0  Temp:     Vitals:   02/27/16 1458 02/27/16 1503 02/27/16 1508 02/27/16 1530  BP: 123/83 124/83    Pulse: 95 85 (!) 0   Resp: 15 15 (!) 0   Temp:      TempSrc:      SpO2: 100% 100% 98% 99%  Weight:      Height:            The results of significant diagnostics from this hospitalization (including imaging, microbiology, ancillary and laboratory) are listed below for reference.     Procedures and Diagnostic Studies:   Dg Chest 2  View  Result Date: 02/25/2016 CLINICAL DATA:  Shortness of breath and lethargy over the last 3 weeks. Recent coronary stent placement. EXAM: CHEST  2 VIEW COMPARISON:  12/07/2015 FINDINGS: Heart size is normal. Mediastinal shadows are normal. There is mild linear scarring or atelectasis in the lower lungs, similar to the study of 12/07/2015. No evidence of congestive heart failure or effusion. No bone abnormality. IMPRESSION: Mild linear scarring or atelectasis at the lung bases, unchanged since the previous study. Electronically Signed   By: Nelson Chimes M.D.   On:  02/25/2016 18:47     Labs:   Basic Metabolic Panel:  Recent Labs Lab 02/25/16 1817 02/26/16 0215 02/26/16 1333 02/27/16 0159  NA 137  --  140 136  K 3.0*  --  3.4* 3.5  CL 104  --  112* 110  CO2 20*  --  21* 20*  GLUCOSE 139*  --  134* 113*  BUN 6  --  <5* <5*  CREATININE 0.79  --  0.75 0.73  CALCIUM 9.2  --  8.5* 8.2*  MG  --  1.7 1.7  --    GFR Estimated Creatinine Clearance: 84.6 mL/min (by C-G formula based on SCr of 0.73 mg/dL). Liver Function Tests: No results for input(s): AST, ALT, ALKPHOS, BILITOT, PROT, ALBUMIN in the last 168 hours. No results for input(s): LIPASE, AMYLASE in the last 168 hours. No results for input(s): AMMONIA in the last 168 hours. Coagulation profile  Recent Labs Lab 02/26/16 1333  INR 1.18    CBC:  Recent Labs Lab 02/25/16 1817 02/26/16 0215 02/27/16 0159  WBC 12.7* 10.8* 9.2  HGB 14.7 12.5 12.4  HCT 43.2 37.5 36.8  MCV 81.1 81.7 82.1  PLT 236 207 220   Cardiac Enzymes:  Recent Labs Lab 02/25/16 1817 02/25/16 2214 02/26/16 0215 02/26/16 0932  TROPONINI 0.05* <0.03 <0.03 0.05*   BNP: Invalid input(s): POCBNP CBG: No results for input(s): GLUCAP in the last 168 hours. D-Dimer  Recent Labs  02/26/16 1333  DDIMER <0.27   Hgb A1c  Recent Labs  02/26/16 0215  HGBA1C 5.8*   Lipid Profile  Recent Labs  02/26/16 0215  CHOL 106  HDL 22*  LDLCALC 57  TRIG 134  CHOLHDL 4.8   Thyroid function studies No results for input(s): TSH, T4TOTAL, T3FREE, THYROIDAB in the last 72 hours.  Invalid input(s): FREET3 Anemia work up No results for input(s): VITAMINB12, FOLATE, FERRITIN, TIBC, IRON, RETICCTPCT in the last 72 hours. Microbiology No results found for this or any previous visit (from the past 240 hour(s)).   Discharge Instructions:   Discharge Instructions    Diet - low sodium heart healthy    Complete by:  As directed    Discharge instructions    Complete by:  As directed    Outpatient gyn  follow up Stop smoking   Increase activity slowly    Complete by:  As directed      Allergies as of 02/27/2016   No Known Allergies     Medication List    STOP taking these medications   ticagrelor 90 MG Tabs tablet Commonly known as:  BRILINTA     TAKE these medications   aspirin 81 MG chewable tablet Chew 81 mg by mouth daily.   atorvastatin 80 MG tablet Commonly known as:  LIPITOR Take 80 mg by mouth at bedtime.   isosorbide mononitrate 30 MG 24 hr tablet Commonly known as:  IMDUR Take 1 tablet (30 mg total) by  mouth daily.   metoprolol tartrate 25 MG tablet Commonly known as:  LOPRESSOR Take 25 mg by mouth 2 (two) times daily.   nitroGLYCERIN 0.4 MG SL tablet Commonly known as:  NITROSTAT Place 0.4 mg under the tongue every 5 (five) minutes as needed for chest pain.   prasugrel 10 MG Tabs tablet Commonly known as:  EFFIENT Take 1 tablet (10 mg total) by mouth daily.      Follow-up Information    Ermalinda Barrios, PA-C Follow up.   Specialty:  Cardiology Why:  Cardiology Hospital Follow-Up on 03/06/2015 at 10:15AM. Contact information: [address] [place] [address] [phone]            Time coordinating discharge: 35 min  Signed:  Robbi Scurlock U Jeweldean Drohan   [facility] 02/28/2016, 5:45 PM

## 2016-03-01 ENCOUNTER — Telehealth: Payer: Self-pay | Admitting: Physician Assistant

## 2016-03-01 MED ORDER — METOPROLOL TARTRATE 25 MG PO TABS
25.0000 mg | ORAL_TABLET | Freq: Two times a day (BID) | ORAL | 0 refills | Status: DC
Start: 2016-03-01 — End: 2016-03-01

## 2016-03-01 MED ORDER — METOPROLOL TARTRATE 25 MG PO TABS: 25.0000 mg | ORAL_TABLET | Freq: Two times a day (BID) | ORAL | 0 refills | Status: DC

## 2016-03-01 NOTE — Telephone Encounter (Signed)
New Message    *STAT* If patient is at the pharmacy, call can be transferred to refill team.   1. Which medications need to be refilled? (please list name of each medication and dose if known)   metoprolol tartrate (LOPRESSOR) 25 MG tablet   2. Which pharmacy/location (including street and city if local pharmacy) is medication to be sent to? [facility] on [address] [place], [place]  3. Do they need a 30 day or 90 day supply? 30 day  Pt scheduled to [facility] on 03/05/16

## 2016-03-05 ENCOUNTER — Ambulatory Visit (INDEPENDENT_AMBULATORY_CARE_PROVIDER_SITE_OTHER): Payer: PRIVATE HEALTH INSURANCE | Admitting: Physician Assistant

## 2016-03-05 ENCOUNTER — Encounter: Payer: Self-pay | Admitting: Physician Assistant

## 2016-03-05 VITALS — BP 90/62 | HR 79 | Ht 61.0 in | Wt 182.0 lb

## 2016-03-05 DIAGNOSIS — I2510 Atherosclerotic heart disease of native coronary artery without angina pectoris: Secondary | ICD-10-CM

## 2016-03-05 DIAGNOSIS — E349 Endocrine disorder, unspecified: Secondary | ICD-10-CM | POA: Diagnosis not present

## 2016-03-05 DIAGNOSIS — Z720 Tobacco use: Secondary | ICD-10-CM | POA: Diagnosis not present

## 2016-03-05 DIAGNOSIS — I2583 Coronary atherosclerosis due to lipid rich plaque: Secondary | ICD-10-CM

## 2016-03-05 DIAGNOSIS — Z6834 Body mass index (BMI) 34.0-34.9, adult: Secondary | ICD-10-CM

## 2016-03-05 DIAGNOSIS — E7800 Pure hypercholesterolemia, unspecified: Secondary | ICD-10-CM | POA: Diagnosis not present

## 2016-03-05 DIAGNOSIS — E6609 Other obesity due to excess calories: Secondary | ICD-10-CM

## 2016-03-05 DIAGNOSIS — E669 Obesity, unspecified: Secondary | ICD-10-CM | POA: Insufficient documentation

## 2016-03-05 MED ORDER — PRASUGREL HCL 10 MG PO TABS: 10.0000 mg | ORAL_TABLET | Freq: Every day | ORAL | 11 refills | Status: DC

## 2016-03-05 MED ORDER — ISOSORBIDE MONONITRATE ER 30 MG PO TB24: 15.0000 mg | ORAL_TABLET | Freq: Every day | ORAL | 0 refills | Status: DC
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
## 2016-03-05 NOTE — Patient Instructions (Addendum)
Medication Instructions:  Your physician has recommended you make the following change in your medication:  1)DECREASE Imdur to 15mg  (1/2 tablet) daily 2)We have refilled Effient. An Rx has been sent to your pharmacy   Labwork: Your physician recommends that you return for a FASTING lipid profile and hepatic same day as your follow up appointment   Testing/Procedures: None ordered  Follow-Up: Your physician recommends that you schedule a follow-up appointment in: 2 months with Dr.Turner   Any Other Special Instructions Will Be Listed Below (If Applicable). REDUCE your Marijuana use  Follow a heart healthy diet. Please see the information below     If you need a refill on your cardiac medications before your next appointment, please call your pharmacy.    Heart-Healthy Eating Plan Introduction Heart-healthy meal planning includes:  Limiting unhealthy fats.  Increasing healthy fats.  Making other small dietary changes. You may need to talk with your doctor or a diet specialist (dietitian) to create an eating plan that is right for you. What types of fat should I choose?  Choose healthy fats. These include olive oil and canola oil, flaxseeds, walnuts, almonds, and seeds.  Eat more omega-3 fats. These include salmon, mackerel, sardines, tuna, flaxseed oil, and ground flaxseeds. Try to eat fish at least twice each week.  Limit saturated fats.  Saturated fats are often found in animal products, such as meats, butter, and cream.  Plant sources of saturated fats include palm oil, palm kernel oil, and coconut oil.  Avoid foods with partially hydrogenated oils in them. These include stick margarine, some tub margarines, cookies, crackers, and other baked goods. These contain trans fats. What general guidelines do I need to follow?  Check food labels carefully. Identify foods with trans fats or high amounts of saturated fat.  Fill one half of your plate with vegetables  and green salads. Eat 4-5 servings of vegetables per day. A serving of vegetables is:  1 cup of raw leafy vegetables.   cup of raw or cooked cut-up vegetables.   cup of vegetable juice.  Fill one fourth of your plate with whole grains. Look for the word "whole" as the first word in the ingredient list.  Fill one fourth of your plate with lean protein foods.  Eat 4-5 servings of fruit per day. A serving of fruit is:  One medium whole fruit.   cup of dried fruit.   cup of fresh, frozen, or canned fruit.   cup of 100% fruit juice.  Eat more foods that contain soluble fiber. These include apples, broccoli, carrots, beans, peas, and barley. Try to get 20-30 g of fiber per day.  Eat more home-cooked food. Eat less restaurant, buffet, and fast food.  Limit or avoid alcohol.  Limit foods high in starch and sugar.  Avoid fried foods.  Avoid frying your food. Try baking, boiling, grilling, or broiling it instead. You can also reduce fat by:  Removing the skin from poultry.  Removing all visible fats from meats.  Skimming the fat off of stews, soups, and gravies before serving them.  Steaming vegetables in water or broth.  Lose weight if you are overweight.  Eat 4-5 servings of nuts, legumes, and seeds per week:  One serving of dried beans or legumes equals  cup after being cooked.  One serving of nuts equals 1 ounces.  One serving of seeds equals  ounce or one tablespoon.  You may need to keep track of how much salt or sodium you  eat. This is especially true if you have high blood pressure. Talk with your doctor or dietitian to get more information. What foods can I eat? Grains  Breads, including [place], white, pita, wheat, raisin, rye, oatmeal, and [place]. Tortillas that are neither fried nor made with lard or trans fat. Low-fat rolls, including hotdog and hamburger buns and English muffins. Biscuits. Muffins. Waffles. Pancakes. Light popcorn. Whole-grain  cereals. Flatbread. Melba toast. Pretzels. Breadsticks. Rusks. Low-fat snacks. Low-fat crackers, including oyster, saltine, matzo, graham, animal, and rye. Rice and pasta, including brown rice and pastas that are made with whole wheat. Vegetables  All vegetables. Fruits  All fruits, but limit coconut. Meats and Other Protein Sources  Lean, well-trimmed beef, veal, pork, and lamb. Chicken and [place] without skin. All fish and shellfish. Wild duck, rabbit, pheasant, and venison. Egg whites or low-cholesterol egg substitutes. Dried beans, peas, lentils, and tofu. Seeds and most nuts. Dairy  Low-fat or nonfat cheeses, including ricotta, string, and mozzarella. Skim or 1% milk that is liquid, powdered, or evaporated. Buttermilk that is made with low-fat milk. Nonfat or low-fat yogurt. Beverages  Mineral water. Diet carbonated beverages. Sweets and Desserts  Sherbets and fruit ices. Honey, jam, marmalade, jelly, and syrups. Meringues and gelatins. Pure sugar candy, such as hard candy, jelly beans, gumdrops, mints, marshmallows, and small amounts of dark chocolate. [employer] Eat all sweets and desserts in moderation. Fats and Oils  Nonhydrogenated (trans-free) margarines. Vegetable oils, including soybean, sesame, sunflower, olive, peanut, safflower, corn, canola, and cottonseed. Salad dressings or mayonnaise made with a vegetable oil. Limit added fats and oils that you use for cooking, baking, salads, and as spreads. Other  Cocoa powder. Coffee and tea. All seasonings and condiments. The items listed above may not be a complete list of recommended foods or beverages. Contact your dietitian for more options.  What foods are not recommended? Grains  Breads that are made with saturated or trans fats, oils, or whole milk. Croissants. Butter rolls. Cheese breads. Sweet rolls. Donuts. Buttered popcorn. Chow mein noodles. High-fat crackers, such as cheese or butter crackers. Meats and Other Protein  Sources  Fatty meats, such as hotdogs, short ribs, sausage, spareribs, bacon, rib eye roast or steak, and mutton. High-fat deli meats, such as salami and bologna. Caviar. Domestic duck and goose. Organ meats, such as kidney, liver, sweetbreads, and heart. Dairy  Cream, sour cream, cream cheese, and creamed cottage cheese. Whole-milk cheeses, including blue (bleu), [place], [place], [place], American, [place], Swiss, cheddar, [place], and [place]. Whole or 2% milk that is liquid, evaporated, or condensed. Whole buttermilk. Cream sauce or high-fat cheese sauce. Yogurt that is made from whole milk. Beverages  Regular sodas and juice drinks with added sugar. Sweets and Desserts  Frosting. Pudding. Cookies. Cakes other than angel food cake. Candy that has milk chocolate or white chocolate, hydrogenated fat, butter, coconut, or unknown ingredients. Buttered syrups. Full-fat ice cream or ice cream drinks. Fats and Oils  Gravy that has suet, meat fat, or shortening. Cocoa butter, hydrogenated oils, palm oil, coconut oil, palm kernel oil. These can often be found in baked products, candy, fried foods, nondairy creamers, and whipped toppings. Solid fats and shortenings, including bacon fat, salt pork, lard, and butter. Nondairy cream substitutes, such as coffee creamers and sour cream substitutes. Salad dressings that are made of unknown oils, cheese, or sour cream. The items listed above may not be a complete list of foods and beverages to avoid. Contact your dietitian for more information.  This information is not intended to replace advice given to you by your health care provider. Make sure you discuss any questions you have with your health care provider. Document Released: 08/13/2011 Document Revised: 07/20/2015 Document Reviewed: 08/05/2013  2017 Elsevier

## 2016-03-31 ENCOUNTER — Other Ambulatory Visit: Payer: Self-pay | Admitting: Physician Assistant

## 2016-04-05 ENCOUNTER — Encounter (HOSPITAL_COMMUNITY): Payer: Self-pay | Admitting: Neurology

## 2016-04-05 ENCOUNTER — Emergency Department (HOSPITAL_COMMUNITY): Payer: 59

## 2016-04-05 ENCOUNTER — Ambulatory Visit (INDEPENDENT_AMBULATORY_CARE_PROVIDER_SITE_OTHER): Payer: PRIVATE HEALTH INSURANCE | Admitting: Physician Assistant

## 2016-04-05 ENCOUNTER — Emergency Department (HOSPITAL_COMMUNITY)
Admission: EM | Admit: 2016-04-05 | Discharge: 2016-04-05 | Discharge status: Against Medical Advice | Payer: 59 | Attending: Emergency Medicine | Admitting: Emergency Medicine

## 2016-04-05 ENCOUNTER — Other Ambulatory Visit: Payer: Self-pay

## 2016-04-05 ENCOUNTER — Telehealth: Payer: Self-pay | Admitting: Cardiology

## 2016-04-05 ENCOUNTER — Encounter: Payer: Self-pay | Admitting: Physician Assistant

## 2016-04-05 VITALS — BP 112/82 | HR 76 | Ht 61.0 in | Wt 185.0 lb

## 2016-04-05 DIAGNOSIS — R079 Chest pain, unspecified: Secondary | ICD-10-CM | POA: Diagnosis not present

## 2016-04-05 DIAGNOSIS — I25110 Atherosclerotic heart disease of native coronary artery with unstable angina pectoris: Secondary | ICD-10-CM

## 2016-04-05 DIAGNOSIS — I252 Old myocardial infarction: Secondary | ICD-10-CM | POA: Insufficient documentation

## 2016-04-05 DIAGNOSIS — I200 Unstable angina: Secondary | ICD-10-CM

## 2016-04-05 DIAGNOSIS — Z79899 Other long term (current) drug therapy: Secondary | ICD-10-CM | POA: Diagnosis not present

## 2016-04-05 DIAGNOSIS — Z87891 Personal history of nicotine dependence: Secondary | ICD-10-CM | POA: Diagnosis not present

## 2016-04-05 DIAGNOSIS — Z955 Presence of coronary angioplasty implant and graft: Secondary | ICD-10-CM | POA: Insufficient documentation

## 2016-04-05 DIAGNOSIS — Z7982 Long term (current) use of aspirin: Secondary | ICD-10-CM | POA: Insufficient documentation

## 2016-04-05 LAB — BASIC METABOLIC PANEL
ANION GAP: 9 (ref 5–15)
BUN: 5 mg/dL — ABNORMAL LOW (ref 6–20)
CO2: 24 mmol/L (ref 22–32)
Calcium: 9.1 mg/dL (ref 8.9–10.3)
Chloride: 108 mmol/L (ref 101–111)
Creatinine, Ser: 0.83 mg/dL (ref 0.44–1.00)
GFR calc Af Amer: >60 mL/min (ref >60)
GLUCOSE: 112 mg/dL — AB (ref 65–99)
POTASSIUM: 3.2 mmol/L — AB (ref 3.5–5.1)
Sodium: 141 mmol/L (ref 135–145)

## 2016-04-05 LAB — D-DIMER, QUANTITATIVE (NOT AT ARMC): D DIMER QUANT: 0.34 ug{FEU}/mL (ref 0.00–0.50)

## 2016-04-05 LAB — CBC
HEMATOCRIT: 41 % (ref 36.0–46.0)
HEMOGLOBIN: 13.6 g/dL (ref 12.0–15.0)
MCH: 27.3 pg (ref 26.0–34.0)
MCHC: 33.2 g/dL (ref 30.0–36.0)
MCV: 82.2 fL (ref 78.0–100.0)
Platelets: 184 10*3/uL (ref 150–400)
RBC: 4.99 MIL/uL (ref 3.87–5.11)
RDW: 15.1 % (ref 11.5–15.5)
WBC: 8.3 10*3/uL (ref 4.0–10.5)

## 2016-04-05 LAB — I-STAT TROPONIN, ED: Troponin i, poc: 0 ng/mL (ref 0.00–0.08)

## 2016-04-05 LAB — RAPID URINE DRUG SCREEN, HOSP PERFORMED
Amphetamines: NOT DETECTED
Barbiturates: NOT DETECTED
Benzodiazepines: NOT DETECTED
COCAINE: NOT DETECTED
OPIATES: NOT DETECTED
Tetrahydrocannabinol: POSITIVE — AB

## 2016-04-05 NOTE — ED Notes (Signed)
This RN went into pts room to offer her something to drink. Pt was extremely angry and demanded that this RN remove her IV because she was leaving. Stated she has been waiting all day and called out several times for something to drink and no one brought her anything. RN attempted to reassure patient and provide information on the delay in care. RN apologized for any delays. Also attempted to discuss risks and benefits of leaving and offered to have EDP speak with patient. Patient states that she is not going to wait any more and that she will take the IV out herself if we do not remove it. Refused reassesssment vital signs and refused to sign AMA form. EDP attempted to talk to patient but patient demanded he get out of her room. RN removed IV per patient request. Pt ambulated without difficulty out of ED.

## 2016-04-05 NOTE — ED Triage Notes (Signed)
Per ems- pt reports cp intermittently for several days, but worse in the middle of the night. Went to cardiology office and they sent her here. Chest pressure at current 3/10, radiating into left neck. Total 4 nitro today, 324 aspirin. Had recent med change of isosorbide to half her dose, due to hypotension. Feels sob 93% RA, BP 120/70.
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
## 2016-04-05 NOTE — Telephone Encounter (Signed)
Received call transferred directly from operator and spoke with pt. She reports she continued full dose of Imdur for about 10 days after last office visit and then decreased to half tablet daily.  Since decreasing she has had  episodes of chest pain.  She states it comes and goes.  Associated with shortness of breath. She has been hesitant to take SL NTG but today had episode of chest pain and did take one NTG with relief. Is not having chest pain at current time. Some shortness of breath but no worse than it has been.   Does not check BP but states it usually runs low.  Will review with provider in office.

## 2016-04-05 NOTE — Telephone Encounter (Signed)
I reviewed with B. Bhagat, PA and he can see pt today at 10:30.  I spoke with pt and she will come in today for office visit.

## 2016-04-05 NOTE — Telephone Encounter (Signed)
New Message    Pt c/o of Chest Pain: STAT if CP now or developed within 24 hours  1. Are you having CP right now? yes  2. Are you experiencing any other symptoms (ex. SOB, nausea, vomiting, sweating)? Sob (some) sweating  3. How long have you been experiencing CP? A couple days   4. Is your CP continuous or coming and going? Yes it comes and goes (thinks it angina)  5. Have you taken Nitroglycerin? Yes  ?

## 2016-04-08 ENCOUNTER — Other Ambulatory Visit: Payer: Self-pay | Admitting: Physician Assistant

## 2016-04-09 ENCOUNTER — Telehealth: Payer: Self-pay | Admitting: Cardiology

## 2016-04-09 NOTE — Telephone Encounter (Signed)
Medication Detail    Disp Refills Start End   metoprolol tartrate (LOPRESSOR) 25 MG tablet 60 tablet 10 04/01/2016    Sig - Route: Take 1 tablet (25 mg total) by mouth 2 (two) times daily. - Oral   E-Prescribing Status: Receipt confirmed by pharmacy (04/01/2016 12:49 PM EST)   Pharmacy   [id number] - [place], [place] - [address]

## 2016-04-09 NOTE — Telephone Encounter (Signed)
Pt is calling requesting a refill on Isosorbide 30 mg tablet taken 1/2 tablet daily. Pt stated that she is taking 1 tablet daily and would like Dr. Radford Pax to prescribe Isosorbide 30 tablet taken 1 tablet daily. Would you like to prescribe this medication as 1 tablet daily? Please advise

## 2016-04-10 NOTE — Telephone Encounter (Signed)
Tried to call pt re: refill of Imdur 30 mg qd and to check on pts bp. Lmptcb 04/10/16

## 2016-04-10 NOTE — Telephone Encounter (Signed)
Patient returned your call. She stated that she is working all day but will try to answer when you call.

## 2016-04-10 NOTE — Telephone Encounter (Signed)
Discussed with Dr. Radford Pax. If BP stable, increase Imdur to 30mg  qd.

## 2016-04-10 NOTE — Telephone Encounter (Signed)
Returned pts call .. lmptcb jw 04/10/16

## 2016-04-15 NOTE — Telephone Encounter (Signed)
Patient left a msg on the refill vm returning your call in regards to the isosorbide. Patient can be reached at [phone]. Thanks, MI

## 2016-04-15 NOTE — Telephone Encounter (Signed)
Left another message for pt to call back.

## 2016-04-30 ENCOUNTER — Encounter: Payer: Self-pay | Admitting: Cardiology

## 2016-05-06 NOTE — Telephone Encounter (Signed)
Patient says that she has been out of her Isosorb mono ER 30mg  for about 10 days.  She would like a refill on this medication.

## 2016-05-07 ENCOUNTER — Ambulatory Visit: Payer: PRIVATE HEALTH INSURANCE | Admitting: Cardiology

## 2016-05-07 ENCOUNTER — Other Ambulatory Visit: Payer: PRIVATE HEALTH INSURANCE

## 2016-05-07 MED ORDER — ISOSORBIDE MONONITRATE ER 30 MG PO TB24: 15.0000 mg | ORAL_TABLET | Freq: Every day | ORAL | 0 refills | Status: DC

## 2016-05-07 NOTE — Telephone Encounter (Signed)
Left a message for pt re: Imdur refill.  I have tried several times to reach pt re: blood pressures before we can refill the Imdur 30 taking a whole tablet, but we did send in the 30 Imdur 1/2 tablet daily until we follow-up with pt on her bp, per [employer], PA-C.

## 2016-05-13 DIAGNOSIS — F17210 Nicotine dependence, cigarettes, uncomplicated: Secondary | ICD-10-CM | POA: Insufficient documentation

## 2016-05-14 ENCOUNTER — Encounter: Payer: Self-pay | Admitting: Nurse Practitioner

## 2016-05-14 ENCOUNTER — Ambulatory Visit (INDEPENDENT_AMBULATORY_CARE_PROVIDER_SITE_OTHER): Payer: PRIVATE HEALTH INSURANCE | Admitting: Nurse Practitioner

## 2016-05-14 ENCOUNTER — Other Ambulatory Visit (INDEPENDENT_AMBULATORY_CARE_PROVIDER_SITE_OTHER): Payer: PRIVATE HEALTH INSURANCE

## 2016-05-14 VITALS — BP 100/64 | HR 64 | Ht 61.0 in | Wt 183.6 lb

## 2016-05-14 DIAGNOSIS — R1084 Generalized abdominal pain: Secondary | ICD-10-CM

## 2016-05-14 DIAGNOSIS — R112 Nausea with vomiting, unspecified: Secondary | ICD-10-CM | POA: Diagnosis not present

## 2016-05-14 DIAGNOSIS — K921 Melena: Secondary | ICD-10-CM | POA: Diagnosis not present

## 2016-05-14 LAB — HCG, QUANTITATIVE, PREGNANCY: QUANTITATIVE HCG: 6.6 m[IU]/mL

## 2016-05-14 MED ORDER — PANTOPRAZOLE SODIUM 40 MG PO TBEC: 40.0000 mg | DELAYED_RELEASE_TABLET | Freq: Two times a day (BID) | ORAL | 1 refills | Status: DC
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
## 2016-05-14 NOTE — Progress Notes (Signed)
Thank you for sending this case to me. I have reviewed the entire note, and the outlined plan seems appropriate.   Henry Danis, MD  

## 2016-05-14 NOTE — Patient Instructions (Addendum)
You have been given a separate informational sheet regarding your tobacco use, the importance of quitting and local resources to help you quit.  Your physician has requested that you go to the basement for the following lab work before leaving today: HCG quantitative.    Increase your Protonix 40 mg to twice daily. A new prescription has been sent to your pharmacy.   Please go see your gynecologist.

## 2016-05-15 ENCOUNTER — Telehealth: Payer: Self-pay | Admitting: Nurse Practitioner

## 2016-05-15 NOTE — Telephone Encounter (Signed)
Results are in EPIC.  

## 2016-05-16 NOTE — Telephone Encounter (Signed)
Patient is aware of the actual level of the hCG, quantitative but no recommendations given to her with the understanding that I will have to wait for the provider to review. Patient states she started having diarrhea in the middle of the night. It is urgent and she has had incontinence. Afebrile but nauseous. No abdominal cramping or soreness. Protonix 40 mg was increased to twice a day dosing on 05/14/16.

## 2016-05-16 NOTE — Telephone Encounter (Signed)
Rhonda Brown, she is not pregnant Im sure as levels have not increased. I need to review a few things and will get back to you

## 2016-05-16 NOTE — Telephone Encounter (Signed)
Pt is calling about her Lab results.  "If I do not get them today I am going to lose my patients and I will come to the office"

## 2016-05-23 ENCOUNTER — Ambulatory Visit (INDEPENDENT_AMBULATORY_CARE_PROVIDER_SITE_OTHER): Payer: PRIVATE HEALTH INSURANCE | Admitting: Physician Assistant

## 2016-05-23 ENCOUNTER — Encounter: Payer: Self-pay | Admitting: Physician Assistant

## 2016-05-23 ENCOUNTER — Telehealth: Payer: Self-pay | Admitting: Emergency Medicine

## 2016-05-23 ENCOUNTER — Telehealth: Payer: Self-pay | Admitting: Nurse Practitioner

## 2016-05-23 ENCOUNTER — Other Ambulatory Visit (INDEPENDENT_AMBULATORY_CARE_PROVIDER_SITE_OTHER): Payer: PRIVATE HEALTH INSURANCE

## 2016-05-23 VITALS — BP 110/80 | HR 78 | Ht 61.0 in | Wt 184.2 lb

## 2016-05-23 DIAGNOSIS — K921 Melena: Secondary | ICD-10-CM

## 2016-05-23 DIAGNOSIS — R109 Unspecified abdominal pain: Secondary | ICD-10-CM

## 2016-05-23 DIAGNOSIS — R110 Nausea: Secondary | ICD-10-CM

## 2016-05-23 DIAGNOSIS — R197 Diarrhea, unspecified: Secondary | ICD-10-CM

## 2016-05-23 LAB — HEMOGLOBIN: HEMOGLOBIN: 14.8 g/dL (ref 12.0–15.0)

## 2016-05-23 MED ORDER — RANITIDINE HCL 150 MG PO TABS: 150.0000 mg | ORAL_TABLET | Freq: Two times a day (BID) | ORAL | 2 refills | Status: DC

## 2016-05-23 MED ORDER — HYOSCYAMINE SULFATE 0.125 MG SL SUBL: 0.1250 mg | SUBLINGUAL_TABLET | SUBLINGUAL | 0 refills | Status: DC | PRN

## 2016-05-23 MED ORDER — AMBULATORY NON FORMULARY MEDICATION: 5.0000 mL | 0 refills | Status: DC | PRN

## 2016-05-23 NOTE — Telephone Encounter (Signed)
Tried to call back. No answer. "Mailbox is full and cannot accept messages"

## 2016-05-23 NOTE — Telephone Encounter (Signed)
Rhonda Brown 1965-08-13 [id number]  Dear Dr. Radford PaxFortunato Curling like to schedule the above named patient for a(n) colonoscopy procedure. Our records show that (s)he is on Effient after having a MI in 12/17.   Please advise as to whether the patient may come of their therapy of Effient for 7 days for this procedure.   Please route your response to Tinnie Gens, CMA or fax response to [phone].    Sincerely,  Tinnie Gens, [facility]

## 2016-05-23 NOTE — Telephone Encounter (Signed)
The patient's endoscopy can be done with her still on Effient.

## 2016-05-23 NOTE — Progress Notes (Signed)
Thank you for sending this case to me and discussing it with me in clinic today. I have reviewed the entire note, and the outlined plan is what we discussed.  There seems likely to be a significant functional/anxiety component to the patient's symptom complex. Anti-spasmodic might be helpful.  Wilfrid Lund, MD

## 2016-05-23 NOTE — Telephone Encounter (Signed)
Unless it is an emergency, recommendations are to continue Effient for one year as she had an ACS with PCI and DES 01/2016.  Date to stop effient would be 01/2017.

## 2016-05-23 NOTE — Patient Instructions (Addendum)
You have been scheduled for an abdominal ultrasound at [facility] (1st floor of hospital) on 05-29-16 at 7:30 am. Please arrive 15 minutes prior to your appointment for registration. Make certain not to have anything to eat or drink 6 hours prior to your appointment. Should you need to reschedule your appointment, please contact radiology at [phone]. This test typically takes about 30 minutes to perform.  Continue Pantoprazole 40 mg twice a day.   We have sent the following medications to your pharmacy for you to pick up at your convenience:   Zantac 150 mg twice a day.   Gi cocktail 5-10 ml as needed for nausea and pain.    Hyoscyamine 0.125 mg every 4-6 hours as needed for abdominal pain.

## 2016-05-23 NOTE — Telephone Encounter (Signed)
Noted! Thank you
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
## 2016-05-24 ENCOUNTER — Encounter (HOSPITAL_COMMUNITY): Payer: Self-pay | Admitting: Emergency Medicine

## 2016-05-24 ENCOUNTER — Emergency Department (HOSPITAL_COMMUNITY)
Admission: EM | Admit: 2016-05-24 | Discharge: 2016-05-24 | Disposition: A | Discharge status: Home / Self Care | Payer: PRIVATE HEALTH INSURANCE | Attending: Emergency Medicine | Admitting: Emergency Medicine

## 2016-05-24 ENCOUNTER — Emergency Department (HOSPITAL_COMMUNITY): Payer: PRIVATE HEALTH INSURANCE

## 2016-05-24 DIAGNOSIS — K2960 Other gastritis without bleeding: Secondary | ICD-10-CM | POA: Diagnosis not present

## 2016-05-24 DIAGNOSIS — R1011 Right upper quadrant pain: Secondary | ICD-10-CM

## 2016-05-24 DIAGNOSIS — Z79899 Other long term (current) drug therapy: Secondary | ICD-10-CM | POA: Insufficient documentation

## 2016-05-24 DIAGNOSIS — K8020 Calculus of gallbladder without cholecystitis without obstruction: Secondary | ICD-10-CM | POA: Insufficient documentation

## 2016-05-24 DIAGNOSIS — R109 Unspecified abdominal pain: Secondary | ICD-10-CM | POA: Diagnosis present

## 2016-05-24 DIAGNOSIS — Z7982 Long term (current) use of aspirin: Secondary | ICD-10-CM | POA: Insufficient documentation

## 2016-05-24 DIAGNOSIS — I252 Old myocardial infarction: Secondary | ICD-10-CM | POA: Diagnosis not present

## 2016-05-24 DIAGNOSIS — Z955 Presence of coronary angioplasty implant and graft: Secondary | ICD-10-CM | POA: Diagnosis not present

## 2016-05-24 DIAGNOSIS — I2510 Atherosclerotic heart disease of native coronary artery without angina pectoris: Secondary | ICD-10-CM | POA: Diagnosis not present

## 2016-05-24 DIAGNOSIS — F17200 Nicotine dependence, unspecified, uncomplicated: Secondary | ICD-10-CM | POA: Diagnosis not present

## 2016-05-24 DIAGNOSIS — K2900 Acute gastritis without bleeding: Secondary | ICD-10-CM

## 2016-05-24 LAB — URINALYSIS, ROUTINE W REFLEX MICROSCOPIC
BILIRUBIN URINE: NEGATIVE
Glucose, UA: NEGATIVE mg/dL
Hgb urine dipstick: NEGATIVE
KETONES UR: NEGATIVE mg/dL
Leukocytes, UA: NEGATIVE
NITRITE: NEGATIVE
PH: 5 (ref 5.0–8.0)
Protein, ur: NEGATIVE mg/dL
Specific Gravity, Urine: 1.01 (ref 1.005–1.030)

## 2016-05-24 LAB — COMPREHENSIVE METABOLIC PANEL
ALBUMIN: 3.7 g/dL (ref 3.5–5.0)
ALK PHOS: 100 U/L (ref 38–126)
ALT: 22 U/L (ref 14–54)
AST: 23 U/L (ref 15–41)
Anion gap: 10 (ref 5–15)
BILIRUBIN TOTAL: 0.8 mg/dL (ref 0.3–1.2)
BUN: 8 mg/dL (ref 6–20)
CALCIUM: 8.8 mg/dL — AB (ref 8.9–10.3)
CO2: 20 mmol/L — ABNORMAL LOW (ref 22–32)
Chloride: 108 mmol/L (ref 101–111)
Creatinine, Ser: 0.9 mg/dL (ref 0.44–1.00)
GFR calc Af Amer: >60 mL/min (ref >60)
GFR calc non Af Amer: >60 mL/min (ref >60)
GLUCOSE: 159 mg/dL — AB (ref 65–99)
Potassium: 3.4 mmol/L — ABNORMAL LOW (ref 3.5–5.1)
Sodium: 138 mmol/L (ref 135–145)
TOTAL PROTEIN: 6.9 g/dL (ref 6.5–8.1)

## 2016-05-24 LAB — CBC
HEMATOCRIT: 43.8 % (ref 36.0–46.0)
Hemoglobin: 14.2 g/dL (ref 12.0–15.0)
MCH: 27.3 pg (ref 26.0–34.0)
MCHC: 32.4 g/dL (ref 30.0–36.0)
MCV: 84.1 fL (ref 78.0–100.0)
Platelets: 192 10*3/uL (ref 150–400)
RBC: 5.21 MIL/uL — ABNORMAL HIGH (ref 3.87–5.11)
RDW: 15.6 % — AB (ref 11.5–15.5)
WBC: 10.7 10*3/uL — ABNORMAL HIGH (ref 4.0–10.5)

## 2016-05-24 LAB — LIPASE, BLOOD: Lipase: 29 U/L (ref 11–51)

## 2016-05-24 MED ORDER — SODIUM CHLORIDE 0.9 % IV BOLUS (SEPSIS)
1000.0000 mL | Freq: Once | INTRAVENOUS | Status: AC
  Administered 2016-05-24: 1000 mL via INTRAVENOUS

## 2016-05-24 MED ORDER — PROMETHAZINE HCL 25 MG PO TABS: 25.0000 mg | ORAL_TABLET | Freq: Four times a day (QID) | ORAL | 0 refills | Status: DC | PRN

## 2016-05-24 MED ORDER — IOPAMIDOL (ISOVUE-300) INJECTION 61%
INTRAVENOUS | Status: AC
  Administered 2016-05-24: 100 mL
  Filled 2016-05-24: qty 100

## 2016-05-24 MED ORDER — SUCRALFATE 1 G PO TABS: 1.0000 g | ORAL_TABLET | Freq: Three times a day (TID) | ORAL | 0 refills | Status: DC

## 2016-05-24 MED ORDER — TRAMADOL HCL 50 MG PO TABS: 50.0000 mg | ORAL_TABLET | Freq: Four times a day (QID) | ORAL | 0 refills | Status: DC | PRN
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
## 2016-05-24 NOTE — ED Notes (Signed)
Patient transported to Ultrasound 

## 2016-05-24 NOTE — Discharge Instructions (Signed)
You need to see her GI doctor for further evaluation of her gallbladder.  Return here as needed.  Increase your fluid intake and rest as much as possible

## 2016-05-24 NOTE — ED Notes (Signed)
Patient transported to CT 

## 2016-05-24 NOTE — ED Triage Notes (Signed)
Pt reports ongoing lower abdominal pain with n/v/d x2 weeks, reports seeing GI doctor and given meds with no relief. Pt also reports left arm pain that began this am as well. Denies chest pain or sob.

## 2016-05-27 ENCOUNTER — Telehealth: Payer: Self-pay | Admitting: Physician Assistant

## 2016-05-27 NOTE — Telephone Encounter (Signed)
[place] from the ED visit findings are as follows:  IMPRESSION: Cholelithiasis.  Increase in liver echogenicity, a finding most likely due to hepatic steatosis. While no focal liver lesions are evident, it must be cautioned that sensitivity of ultrasound for detection of focal liver lesions is diminished in this circumstance.   I tried to call the pt and no answer and the voice mail is full.

## 2016-05-27 NOTE — Telephone Encounter (Signed)
Patient was seen here and again in the ER

## 2016-05-28 ENCOUNTER — Ambulatory Visit: Payer: PRIVATE HEALTH INSURANCE | Admitting: Cardiology

## 2016-05-28 ENCOUNTER — Other Ambulatory Visit: Payer: Self-pay

## 2016-05-28 ENCOUNTER — Other Ambulatory Visit: Payer: PRIVATE HEALTH INSURANCE

## 2016-05-28 DIAGNOSIS — K8020 Calculus of gallbladder without cholecystitis without obstruction: Secondary | ICD-10-CM

## 2016-05-28 NOTE — Telephone Encounter (Signed)
Left message for patient to call office.  

## 2016-05-28 NOTE — Telephone Encounter (Signed)
Pt referral has been made to [facility] the pt will return call if she does not have appt scheduled by Friday

## 2016-05-28 NOTE — Telephone Encounter (Signed)
Yes please refer to [facility] for ASAP eval. Thanks-JLL

## 2016-05-28 NOTE — Telephone Encounter (Signed)
Rhonda Brown the pt was scheduled for [place] for 05/29/16 however, she went to the ED and had US done see impression below:  IMPRESSION: Cholelithiasis.  Increase in liver echogenicity, a finding most likely due to hepatic steatosis. While no focal liver lesions are evident, it must be cautioned that sensitivity of ultrasound for detection of focal liver lesions is diminished in this circumstance.  I have cancelled the [place] for 05/29/16, do you want to refer to [facility]?

## 2016-05-29 ENCOUNTER — Ambulatory Visit (HOSPITAL_COMMUNITY): Admission: RE | Admit: 2016-05-29 | Payer: PRIVATE HEALTH INSURANCE | Source: Ambulatory Visit

## 2016-05-29 ENCOUNTER — Telehealth: Payer: Self-pay | Admitting: Emergency Medicine

## 2016-05-29 NOTE — Telephone Encounter (Signed)
Patient has an appointment on 06/11/16 at 1:50 pm with Dr. Rosendo Gros at [facility].

## 2016-06-03 ENCOUNTER — Other Ambulatory Visit: Payer: Self-pay | Admitting: Physician Assistant

## 2016-06-06 ENCOUNTER — Ambulatory Visit: Payer: PRIVATE HEALTH INSURANCE | Admitting: Nurse Practitioner

## 2016-06-07 NOTE — Telephone Encounter (Signed)
Left message to call office

## 2016-06-11 ENCOUNTER — Other Ambulatory Visit: Payer: Self-pay

## 2016-06-11 MED ORDER — ATORVASTATIN CALCIUM 80 MG PO TABS: 80.0000 mg | ORAL_TABLET | Freq: Every day | ORAL | 0 refills | Status: DC

## 2016-06-14 NOTE — Telephone Encounter (Signed)
Left message to call office

## 2016-06-14 NOTE — Telephone Encounter (Signed)
Spoke to patient and she is feeling better and does not want to proceed with an procedure.

## 2016-06-18 ENCOUNTER — Encounter: Payer: Self-pay | Admitting: Cardiology

## 2016-06-18 ENCOUNTER — Ambulatory Visit (INDEPENDENT_AMBULATORY_CARE_PROVIDER_SITE_OTHER): Payer: PRIVATE HEALTH INSURANCE | Admitting: Cardiology

## 2016-06-18 ENCOUNTER — Other Ambulatory Visit: Payer: PRIVATE HEALTH INSURANCE | Admitting: *Deleted

## 2016-06-18 VITALS — BP 108/76 | HR 65 | Ht 62.0 in | Wt 182.0 lb

## 2016-06-18 DIAGNOSIS — R5383 Other fatigue: Secondary | ICD-10-CM

## 2016-06-18 DIAGNOSIS — E7800 Pure hypercholesterolemia, unspecified: Secondary | ICD-10-CM

## 2016-06-18 LAB — HEPATIC FUNCTION PANEL
ALBUMIN: 4.1 g/dL (ref 3.5–5.5)
ALT: 15 IU/L (ref 0–32)
AST: 15 IU/L (ref 0–40)
Alkaline Phosphatase: 124 IU/L — ABNORMAL HIGH (ref 39–117)
BILIRUBIN, DIRECT: 0.1 mg/dL (ref 0.00–0.40)
Bilirubin Total: 0.3 mg/dL (ref 0.0–1.2)
TOTAL PROTEIN: 6.8 g/dL (ref 6.0–8.5)

## 2016-06-18 LAB — LIPID PANEL
CHOL/HDL RATIO: 6.2 ratio — AB (ref 0.0–4.4)
Cholesterol, Total: 143 mg/dL (ref 100–199)
HDL: 23 mg/dL — AB (ref >39)
LDL Calculated: 54 mg/dL (ref 0–99)
Triglycerides: 328 mg/dL — ABNORMAL HIGH (ref 0–149)
VLDL Cholesterol Cal: 66 mg/dL — ABNORMAL HIGH (ref 5–40)

## 2016-06-18 MED ORDER — RANOLAZINE ER 500 MG PO TB12: 500.0000 mg | ORAL_TABLET | Freq: Two times a day (BID) | ORAL | 0 refills | Status: DC
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
## 2016-06-18 NOTE — Patient Instructions (Signed)
Medication Instructions:  Your physician has recommended you make the following change in your medication: Please start the RANEXA 500 mg twice a day.   Labwork: Your physician recommends that you return for lab work in today for BMP, VIT D, TSH, CBC  Testing/Procedures: None Ordered  Follow-Up: Your physician recommends that you schedule a follow-up appointment in: 3-4 weeks with Lyda Jester, PA-C  Any Other Special Instructions Will Be Listed Below (If Applicable).     If you need a refill on your cardiac medications before your next appointment, please call your pharmacy.

## 2016-06-20 ENCOUNTER — Telehealth: Payer: Self-pay | Admitting: Cardiology

## 2016-06-20 NOTE — Telephone Encounter (Signed)
New message      Returning a call to the nurse to get lab results.  Pt works 9-6 and cannot take calls during that time.  Please call before 9 or leave a message on vm

## 2016-06-22 LAB — BASIC METABOLIC PANEL
BUN/Creatinine Ratio: 11 (ref 9–23)
BUN: 9 mg/dL (ref 6–24)
CALCIUM: 9.4 mg/dL (ref 8.7–10.2)
CO2: 23 mmol/L (ref 18–29)
Chloride: 100 mmol/L (ref 96–106)
Creatinine, Ser: 0.82 mg/dL (ref 0.57–1.00)
GFR, EST AFRICAN AMERICAN: 98 mL/min/{1.73_m2} (ref >59)
GFR, EST NON AFRICAN AMERICAN: 85 mL/min/{1.73_m2} (ref >59)
Glucose: 112 mg/dL — ABNORMAL HIGH (ref 65–99)
POTASSIUM: 3.7 mmol/L (ref 3.5–5.2)
Sodium: 140 mmol/L (ref 134–144)

## 2016-06-22 LAB — CBC
HEMATOCRIT: 41.7 % (ref 34.0–46.6)
Hemoglobin: 14 g/dL (ref 11.1–15.9)
MCH: 27.1 pg (ref 26.6–33.0)
MCHC: 33.6 g/dL (ref 31.5–35.7)
MCV: 81 fL (ref 79–97)
Platelets: 221 10*3/uL (ref 150–379)
RBC: 5.17 x10E6/uL (ref 3.77–5.28)
RDW: 15.5 % — AB (ref 12.3–15.4)
WBC: 9.7 10*3/uL (ref 3.4–10.8)

## 2016-06-22 LAB — VITAMIN D 1,25 DIHYDROXY
VITAMIN D3 1, 25 (OH): 49 pg/mL
Vitamin D 1, 25 (OH)2 Total: 49 pg/mL

## 2016-06-22 LAB — TSH: TSH: 2.3 u[IU]/mL (ref 0.450–4.500)

## 2016-06-25 NOTE — Telephone Encounter (Signed)
Pt returned my call and she was made aware of her lab results. She verbalized understanding.

## 2016-06-25 NOTE — Telephone Encounter (Signed)
-----   Message from Consuelo Pandy, [place] sent at 06/19/2016 11:08 AM EDT ----- hgb looks ok. No anemia. Thyroid function is normal. Kidneys and electrolytes are ok. We are still waiting for Vitamin D level to be resulted. We will notify her when this returns.

## 2016-06-25 NOTE — Telephone Encounter (Signed)
-----   Message from Imogene Burn, PA-C sent at 06/19/2016 10:44 AM EDT ----- Triglycerides very high. Recommend lower sugars in her diet and refer to lipid clinic for further treatment

## 2016-06-29 ENCOUNTER — Other Ambulatory Visit: Payer: Self-pay | Admitting: Cardiology

## 2016-07-04 ENCOUNTER — Encounter: Payer: Self-pay | Admitting: *Deleted

## 2016-07-04 ENCOUNTER — Telehealth: Payer: Self-pay | Admitting: Cardiology

## 2016-07-04 NOTE — Telephone Encounter (Signed)
Follow Up   Pt requests results be left on VM as she is working and unable to return any phone calls at this time.

## 2016-07-04 NOTE — Telephone Encounter (Signed)
New message      Returning a call to get lab results.  Pt will call back after 2 and ask for [employer]

## 2016-07-05 ENCOUNTER — Other Ambulatory Visit: Payer: Self-pay | Admitting: Physician Assistant

## 2016-07-09 ENCOUNTER — Other Ambulatory Visit: Payer: Self-pay | Admitting: Physician Assistant

## 2016-07-09 ENCOUNTER — Ambulatory Visit: Payer: PRIVATE HEALTH INSURANCE | Admitting: Cardiology

## 2016-07-10 ENCOUNTER — Encounter: Payer: Self-pay | Admitting: Cardiology

## 2016-07-16 ENCOUNTER — Emergency Department (HOSPITAL_COMMUNITY)
Admission: EM | Admit: 2016-07-16 | Discharge: 2016-07-17 | Disposition: A | Discharge status: Home / Self Care | Payer: 59 | Attending: Emergency Medicine | Admitting: Emergency Medicine

## 2016-07-16 ENCOUNTER — Encounter (HOSPITAL_COMMUNITY): Payer: Self-pay | Admitting: Emergency Medicine

## 2016-07-16 DIAGNOSIS — M25562 Pain in left knee: Secondary | ICD-10-CM | POA: Diagnosis present

## 2016-07-16 DIAGNOSIS — Z5321 Procedure and treatment not carried out due to patient leaving prior to being seen by health care provider: Secondary | ICD-10-CM | POA: Insufficient documentation

## 2016-07-16 LAB — CBC WITH DIFFERENTIAL/PLATELET
Basophils Absolute: 0 10*3/uL (ref 0.0–0.1)
Basophils Relative: 0 %
EOS ABS: 0.5 10*3/uL (ref 0.0–0.7)
EOS PCT: 4 %
HCT: 44.2 % (ref 36.0–46.0)
Hemoglobin: 14.6 g/dL (ref 12.0–15.0)
LYMPHS ABS: 4.1 10*3/uL — AB (ref 0.7–4.0)
Lymphocytes Relative: 34 %
MCH: 27.7 pg (ref 26.0–34.0)
MCHC: 33 g/dL (ref 30.0–36.0)
MCV: 83.7 fL (ref 78.0–100.0)
MONO ABS: 0.9 10*3/uL (ref 0.1–1.0)
Monocytes Relative: 7 %
Neutro Abs: 6.6 10*3/uL (ref 1.7–7.7)
Neutrophils Relative %: 55 %
PLATELETS: 228 10*3/uL (ref 150–400)
RBC: 5.28 MIL/uL — AB (ref 3.87–5.11)
RDW: 15.6 % — AB (ref 11.5–15.5)
WBC: 12.1 10*3/uL — AB (ref 4.0–10.5)

## 2016-07-16 LAB — I-STAT TROPONIN, ED: TROPONIN I, POC: 0.02 ng/mL (ref 0.00–0.08)

## 2016-07-16 LAB — COMPREHENSIVE METABOLIC PANEL
ALT: 20 U/L (ref 14–54)
ANION GAP: 9 (ref 5–15)
AST: 21 U/L (ref 15–41)
Albumin: 4 g/dL (ref 3.5–5.0)
Alkaline Phosphatase: 107 U/L (ref 38–126)
BUN: 6 mg/dL (ref 6–20)
CHLORIDE: 107 mmol/L (ref 101–111)
CO2: 22 mmol/L (ref 22–32)
Calcium: 9.2 mg/dL (ref 8.9–10.3)
Creatinine, Ser: 1.06 mg/dL — ABNORMAL HIGH (ref 0.44–1.00)
GFR calc Af Amer: >60 mL/min (ref >60)
GFR calc non Af Amer: >60 mL/min (ref >60)
Glucose, Bld: 125 mg/dL — ABNORMAL HIGH (ref 65–99)
POTASSIUM: 3.2 mmol/L — AB (ref 3.5–5.1)
SODIUM: 138 mmol/L (ref 135–145)
Total Bilirubin: 0.7 mg/dL (ref 0.3–1.2)
Total Protein: 7.1 g/dL (ref 6.5–8.1)

## 2016-07-16 NOTE — ED Triage Notes (Signed)
Patient reports left knee pain onset this afternoon , denies injury /ambulatory , pt. is tachycardic at arrival 122/min, pt. denies chest pain /palpitations or SOB .

## 2016-07-16 NOTE — ED Notes (Signed)
Nurse first notified on pt.'s tachycardia , will move to next available bed .

## 2016-07-16 NOTE — ED Notes (Signed)
Dr. Sherry Ruffing notified on pt.'s tachycardia 122.min.

## 2016-07-17 ENCOUNTER — Telehealth: Payer: Self-pay | Admitting: Cardiology

## 2016-07-17 NOTE — ED Notes (Signed)
Called for vitals x3 °

## 2016-07-17 NOTE — Telephone Encounter (Signed)
Patient went to ED last night for swollen L knee after she spoke with her daughter (an Therapist, sports). She was concerned for blood clot. When she arrived at the ED, her knee was evaluated by triage and "they didn't seem concerned at all." An EKG and lab work were completed. Her HR was 122. She was informed she'd get the next bed in the ED. She left because she said the wait was too long. When she woke up this AM, she saw her lab work results and is now concerned. Her troponin was 0.02 and her WBC and RBC count was elevated (12.1 and 5.28).  Her potassium was also 3.2. She denies CP and palpitations. She states she is "a little SOB but no more than usual." Instructed the patient to call her PCP for ASAP evaluation because she could potentially have an infection that needs to be treated. Informed her to take a copy of her labs with her.  She understands her PCP will call if she feels that issues are cardiac in origin.   Instructed patient to call and follow-up after PCP visit.

## 2016-07-17 NOTE — Telephone Encounter (Signed)
New Message  Pt voiced she went to ED with knee swollen last night,, scared of blood clot,, ED 8hr wait,, heartrate was 122,, ekg completed last night with blood drawn,, pt left ED room after EKG,, tribolian levels from blood was elevated,, pt is worried about heart attack and red blood cells are high.  Pt voiced wanting nurse/MD to look at results from last night.  Please f/u with pt

## 2016-08-04 ENCOUNTER — Other Ambulatory Visit: Payer: Self-pay | Admitting: Cardiology

## 2016-08-12 ENCOUNTER — Other Ambulatory Visit: Payer: Self-pay | Admitting: Physician Assistant

## 2016-08-31 ENCOUNTER — Other Ambulatory Visit: Payer: Self-pay | Admitting: Physician Assistant

## 2016-10-31 ENCOUNTER — Other Ambulatory Visit: Payer: Self-pay | Admitting: Cardiology

## 2016-11-04 ENCOUNTER — Other Ambulatory Visit: Payer: Self-pay | Admitting: Family

## 2016-11-04 DIAGNOSIS — Z1231 Encounter for screening mammogram for malignant neoplasm of breast: Secondary | ICD-10-CM

## 2016-11-19 ENCOUNTER — Other Ambulatory Visit: Payer: Self-pay | Admitting: Physician Assistant

## 2016-11-24 ENCOUNTER — Emergency Department (HOSPITAL_COMMUNITY): Payer: 59

## 2016-11-24 ENCOUNTER — Other Ambulatory Visit: Payer: Self-pay

## 2016-11-24 ENCOUNTER — Observation Stay (HOSPITAL_COMMUNITY)
Admission: EM | Admit: 2016-11-24 | Discharge: 2016-11-24 | Discharge status: Against Medical Advice | Payer: 59 | Attending: Internal Medicine | Admitting: Internal Medicine

## 2016-11-24 ENCOUNTER — Encounter (HOSPITAL_COMMUNITY): Payer: Self-pay | Admitting: Emergency Medicine

## 2016-11-24 DIAGNOSIS — E876 Hypokalemia: Secondary | ICD-10-CM | POA: Diagnosis not present

## 2016-11-24 DIAGNOSIS — Z87442 Personal history of urinary calculi: Secondary | ICD-10-CM | POA: Insufficient documentation

## 2016-11-24 DIAGNOSIS — Z955 Presence of coronary angioplasty implant and graft: Secondary | ICD-10-CM | POA: Diagnosis not present

## 2016-11-24 DIAGNOSIS — Z720 Tobacco use: Secondary | ICD-10-CM | POA: Diagnosis not present

## 2016-11-24 DIAGNOSIS — E669 Obesity, unspecified: Secondary | ICD-10-CM | POA: Diagnosis not present

## 2016-11-24 DIAGNOSIS — E785 Hyperlipidemia, unspecified: Secondary | ICD-10-CM | POA: Insufficient documentation

## 2016-11-24 DIAGNOSIS — F17210 Nicotine dependence, cigarettes, uncomplicated: Secondary | ICD-10-CM | POA: Insufficient documentation

## 2016-11-24 DIAGNOSIS — F1290 Cannabis use, unspecified, uncomplicated: Secondary | ICD-10-CM | POA: Diagnosis not present

## 2016-11-24 DIAGNOSIS — D72829 Elevated white blood cell count, unspecified: Secondary | ICD-10-CM | POA: Insufficient documentation

## 2016-11-24 DIAGNOSIS — K219 Gastro-esophageal reflux disease without esophagitis: Secondary | ICD-10-CM | POA: Diagnosis not present

## 2016-11-24 DIAGNOSIS — I252 Old myocardial infarction: Secondary | ICD-10-CM | POA: Diagnosis not present

## 2016-11-24 DIAGNOSIS — I2510 Atherosclerotic heart disease of native coronary artery without angina pectoris: Secondary | ICD-10-CM | POA: Insufficient documentation

## 2016-11-24 DIAGNOSIS — Z79899 Other long term (current) drug therapy: Secondary | ICD-10-CM | POA: Diagnosis not present

## 2016-11-24 DIAGNOSIS — Z7982 Long term (current) use of aspirin: Secondary | ICD-10-CM | POA: Insufficient documentation

## 2016-11-24 DIAGNOSIS — R079 Chest pain, unspecified: Principal | ICD-10-CM | POA: Insufficient documentation

## 2016-11-24 DIAGNOSIS — R0602 Shortness of breath: Secondary | ICD-10-CM | POA: Diagnosis present

## 2016-11-24 LAB — BASIC METABOLIC PANEL
Anion gap: 11 (ref 5–15)
BUN: 9 mg/dL (ref 6–20)
CHLORIDE: 106 mmol/L (ref 101–111)
CO2: 19 mmol/L — AB (ref 22–32)
CREATININE: 0.92 mg/dL (ref 0.44–1.00)
Calcium: 8.9 mg/dL (ref 8.9–10.3)
GFR calc Af Amer: >60 mL/min (ref >60)
GFR calc non Af Amer: >60 mL/min (ref >60)
GLUCOSE: 147 mg/dL — AB (ref 65–99)
POTASSIUM: 3.1 mmol/L — AB (ref 3.5–5.1)
SODIUM: 136 mmol/L (ref 135–145)

## 2016-11-24 LAB — HEPATIC FUNCTION PANEL
ALK PHOS: 102 U/L (ref 38–126)
ALT: 17 U/L (ref 14–54)
AST: 23 U/L (ref 15–41)
Albumin: 3.8 g/dL (ref 3.5–5.0)
Bilirubin, Direct: <0.1 mg/dL — ABNORMAL LOW (ref 0.1–0.5)
TOTAL PROTEIN: 7.1 g/dL (ref 6.5–8.1)
Total Bilirubin: 0.4 mg/dL (ref 0.3–1.2)

## 2016-11-24 LAB — CBC
HEMATOCRIT: 42.8 % (ref 36.0–46.0)
Hemoglobin: 14.4 g/dL (ref 12.0–15.0)
MCH: 28.3 pg (ref 26.0–34.0)
MCHC: 33.6 g/dL (ref 30.0–36.0)
MCV: 84.1 fL (ref 78.0–100.0)
PLATELETS: 216 10*3/uL (ref 150–400)
RBC: 5.09 MIL/uL (ref 3.87–5.11)
RDW: 15.1 % (ref 11.5–15.5)
WBC: 11.8 10*3/uL — AB (ref 4.0–10.5)

## 2016-11-24 LAB — I-STAT BETA HCG BLOOD, ED (MC, WL, AP ONLY): I-stat hCG, quantitative: 8.2 m[IU]/mL — ABNORMAL HIGH (ref <5)

## 2016-11-24 LAB — I-STAT TROPONIN, ED
Troponin i, poc: 0 ng/mL (ref 0.00–0.08)
Troponin i, poc: 0.01 ng/mL (ref 0.00–0.08)

## 2016-11-24 LAB — HCG, QUANTITATIVE, PREGNANCY: hCG, Beta Chain, Quant, S: 9 m[IU]/mL — ABNORMAL HIGH (ref <5)

## 2016-11-24 LAB — D-DIMER, QUANTITATIVE (NOT AT ARMC): D DIMER QUANT: 1.05 ug{FEU}/mL — AB (ref 0.00–0.50)

## 2016-11-24 MED ORDER — FENTANYL CITRATE (PF) 100 MCG/2ML IJ SOLN
100.0000 ug | Freq: Once | INTRAMUSCULAR | Status: AC
  Administered 2016-11-24: 100 ug via INTRAVENOUS
  Filled 2016-11-24: qty 2

## 2016-11-24 MED ORDER — SODIUM CHLORIDE 0.9 % IV BOLUS (SEPSIS)
1000.0000 mL | Freq: Once | INTRAVENOUS | Status: AC
  Administered 2016-11-24: 1000 mL via INTRAVENOUS

## 2016-11-24 MED ORDER — ASPIRIN 81 MG PO CHEW
324.0000 mg | CHEWABLE_TABLET | Freq: Once | ORAL | Status: AC
  Administered 2016-11-24: 324 mg via ORAL
  Filled 2016-11-24: qty 4

## 2016-11-24 NOTE — ED Notes (Signed)
Attempted to call report to [facility], pt unable to go to that unit due to active cp. MD made aware. Will attempt to reach bed placement to get pt to 3W per MD request.

## 2016-11-24 NOTE — ED Notes (Signed)
Pt undid bp cuff repeatedly. States it is uncomfortable.

## 2016-11-24 NOTE — ED Notes (Signed)
Family at bedside. Pt states she will be able to stay since family will be able to go check on her house.

## 2016-11-24 NOTE — ED Notes (Signed)
Pt given another cup of water

## 2016-11-24 NOTE — ED Notes (Signed)
Placed BP cuff back on pt and told pt of need to monitor her VS.
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
## 2016-11-24 NOTE — ED Notes (Signed)
This RN went in to talk with pt, pt no longer in room. Pt gown laying on the floor and IV laying on the bedside table. PA attempting to call pt cell phone at present.

## 2016-11-24 NOTE — ED Notes (Signed)
Dr Myna Hidalgo made aware pt left without signing AMA.
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
## 2016-11-24 NOTE — ED Triage Notes (Signed)
Pt. Stated, I've had stomach pain , SOB, and some chest pain with nausea that started last night. I had a heart attack last year.
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
## 2016-11-26 ENCOUNTER — Ambulatory Visit: Payer: PRIVATE HEALTH INSURANCE | Admitting: Cardiology

## 2016-11-28 ENCOUNTER — Ambulatory Visit: Payer: PRIVATE HEALTH INSURANCE | Admitting: Physician Assistant

## 2016-11-29 ENCOUNTER — Telehealth: Payer: Self-pay | Admitting: Cardiology

## 2016-11-29 NOTE — Telephone Encounter (Signed)
Called patient back about her message. Patient stated she just needed to get in to see her doctor. Patient stated that she recently was in the ED for chest pain and was going to be admitted to be observed. Patient stated she did not want to stay, so she left. Patient stated the ED told her she was not having a heart attack, so she went home. Patient stated she could stay at home and have chest pain. ED note states patient Eloped from the ED without signing [place] papers. Tried to find an appointment for patient to see Dr. Radford Pax sooner than later. First available was 12/18/16, so scheduled patient for this day. Encouraged patient to go back to ED if her CP comes back and/or is worse. Also referred patient to her PCP for follow up. Patient stated her PCP would just send her to her cardiologist. Patient was not happy about having to wait a couple of weeks for an appt. There were no available appointment with APP as well. Patient stated she would try to make the appointment on 12/18/16.

## 2016-11-29 NOTE — Telephone Encounter (Signed)
New message    Pt c/o of Chest Pain: STAT if CP now or developed within 24 hours  1. Are you having CP right now? No   2. Are you experiencing any other symptoms (ex. SOB, nausea, vomiting, sweating)? No   3. How long have you been experiencing CP? Pt was in ER on Sunday.  4. Is your CP continuous or coming and going? Coming and going  5. Have you taken Nitroglycerin? Not since Sunday ?

## 2016-12-04 ENCOUNTER — Ambulatory Visit
Admission: RE | Admit: 2016-12-04 | Discharge: 2016-12-04 | Disposition: A | Discharge status: Home / Self Care | Payer: PRIVATE HEALTH INSURANCE | Source: Ambulatory Visit | Attending: Family | Admitting: Family

## 2016-12-04 DIAGNOSIS — Z1231 Encounter for screening mammogram for malignant neoplasm of breast: Secondary | ICD-10-CM

## 2016-12-18 ENCOUNTER — Ambulatory Visit: Payer: PRIVATE HEALTH INSURANCE | Admitting: Cardiology

## 2017-01-21 ENCOUNTER — Telehealth: Payer: Self-pay | Admitting: Gastroenterology

## 2017-01-21 ENCOUNTER — Emergency Department (HOSPITAL_COMMUNITY): Payer: PRIVATE HEALTH INSURANCE

## 2017-01-21 ENCOUNTER — Other Ambulatory Visit: Payer: Self-pay

## 2017-01-21 DIAGNOSIS — I2510 Atherosclerotic heart disease of native coronary artery without angina pectoris: Secondary | ICD-10-CM | POA: Insufficient documentation

## 2017-01-21 DIAGNOSIS — F17210 Nicotine dependence, cigarettes, uncomplicated: Secondary | ICD-10-CM | POA: Diagnosis not present

## 2017-01-21 DIAGNOSIS — Z79899 Other long term (current) drug therapy: Secondary | ICD-10-CM | POA: Diagnosis not present

## 2017-01-21 DIAGNOSIS — I252 Old myocardial infarction: Secondary | ICD-10-CM | POA: Insufficient documentation

## 2017-01-21 DIAGNOSIS — J209 Acute bronchitis, unspecified: Secondary | ICD-10-CM | POA: Insufficient documentation

## 2017-01-21 DIAGNOSIS — Z7982 Long term (current) use of aspirin: Secondary | ICD-10-CM | POA: Diagnosis not present

## 2017-01-21 DIAGNOSIS — R0602 Shortness of breath: Secondary | ICD-10-CM | POA: Diagnosis present

## 2017-01-21 LAB — CBC
HCT: 43.6 % (ref 36.0–46.0)
Hemoglobin: 14.3 g/dL (ref 12.0–15.0)
MCH: 27.3 pg (ref 26.0–34.0)
MCHC: 32.8 g/dL (ref 30.0–36.0)
MCV: 83.4 fL (ref 78.0–100.0)
PLATELETS: 176 10*3/uL (ref 150–400)
RBC: 5.23 MIL/uL — ABNORMAL HIGH (ref 3.87–5.11)
RDW: 15.8 % — AB (ref 11.5–15.5)
WBC: 6.5 10*3/uL (ref 4.0–10.5)

## 2017-01-21 LAB — I-STAT CG4 LACTIC ACID, ED: Lactic Acid, Venous: 2.1 mmol/L (ref 0.5–1.9)

## 2017-01-21 LAB — URINALYSIS, ROUTINE W REFLEX MICROSCOPIC
Bilirubin Urine: NEGATIVE
GLUCOSE, UA: NEGATIVE mg/dL
Hgb urine dipstick: NEGATIVE
Ketones, ur: NEGATIVE mg/dL
LEUKOCYTES UA: NEGATIVE
Nitrite: NEGATIVE
PH: 7 (ref 5.0–8.0)
PROTEIN: NEGATIVE mg/dL
SPECIFIC GRAVITY, URINE: 1.006 (ref 1.005–1.030)

## 2017-01-21 LAB — BASIC METABOLIC PANEL
Anion gap: 9 (ref 5–15)
BUN: 5 mg/dL — AB (ref 6–20)
CALCIUM: 9 mg/dL (ref 8.9–10.3)
CO2: 22 mmol/L (ref 22–32)
CREATININE: 1.01 mg/dL — AB (ref 0.44–1.00)
Chloride: 109 mmol/L (ref 101–111)
GFR calc Af Amer: >60 mL/min (ref >60)
GFR calc non Af Amer: >60 mL/min (ref >60)
GLUCOSE: 109 mg/dL — AB (ref 65–99)
Potassium: 3.3 mmol/L — ABNORMAL LOW (ref 3.5–5.1)
Sodium: 140 mmol/L (ref 135–145)

## 2017-01-21 LAB — I-STAT BETA HCG BLOOD, ED (MC, WL, AP ONLY): I-stat hCG, quantitative: 12.9 m[IU]/mL — ABNORMAL HIGH (ref <5)

## 2017-01-21 LAB — LIPASE, BLOOD: LIPASE: 29 U/L (ref 11–51)

## 2017-01-21 LAB — I-STAT TROPONIN, ED: TROPONIN I, POC: 0 ng/mL (ref 0.00–0.08)

## 2017-01-21 MED ORDER — ALBUTEROL (5 MG/ML) CONTINUOUS INHALATION SOLN
INHALATION_SOLUTION | RESPIRATORY_TRACT | Status: AC
  Filled 2017-01-21: qty 1

## 2017-01-21 MED ORDER — ALBUTEROL SULFATE (2.5 MG/3ML) 0.083% IN NEBU
5.0000 mg | INHALATION_SOLUTION | Freq: Once | RESPIRATORY_TRACT | Status: AC
  Administered 2017-01-21: 5 mg via RESPIRATORY_TRACT

## 2017-01-21 NOTE — Telephone Encounter (Signed)
Called patient back, left voice message that patient will need to be seen by Dr. Loletha Carrow. I left her the information and scheduled her for first available on 03/04/17, did put her on a move-up list. If she needs to rescheduled asked her to call back.

## 2017-01-21 NOTE — ED Triage Notes (Signed)
Patient arrives with multiple complaints. States primary reason for presentation this evening is shortness of breath. States she feels like she can't get any air. Breath sounds coarse with wheezing throughout. Denies history of Asthma or COPD. Is daily smoker. MI 1 year ago with stent placement. Also complaining of persistent black diarrhea and states it looks like it has skin mixed in. Also states she feels like her stomach is full and not emptying. Patient complaining of foul burps which she exhibited while I was in the room, distinct odor of sulphur noted. States if she eats something it makes her feel more full, but it sometimes eases the discomfort in her abdomen.

## 2017-01-21 NOTE — Telephone Encounter (Signed)
Patient states that 3-4 days ago started having foul "sulfa" tasting burps and watery diarrhea. She has some vomiting and chills. Denies any blood in stool/vomit. She denies being on any antibiotic recently, she states she is taking the Protonix and Carafate as directed. She tried taking the hyoscyamine but it did not help with the stomach cramping.

## 2017-01-21 NOTE — ED Notes (Signed)
Writer notified EDP Little of abnormal I-stat lactic result

## 2017-01-21 NOTE — Telephone Encounter (Signed)
I've not seen this patient, and she has not been seen by our APPs in 8 months.  Sounds like similar symptoms to what she was having then.  Best thing would be an appointment with me when available (no 4:15 or overbook)

## 2017-01-21 NOTE — Telephone Encounter (Signed)
Spoke to patient, she did not listen to her message. Let her know the first available appointment and is on move-up list. Patient not happy about this, asked what else she could do. I did suggest she contact her PCP or if symptoms worsen ED. Patient stated that it "is ridiculous that the doctor does not have an appointment for 6 weeks", said that she would have to see about finding another GI practice, then hung up the phone. I am not sure if she will keep her appointment on 03/04/17 or not, I have left it in place.

## 2017-01-22 ENCOUNTER — Emergency Department (HOSPITAL_COMMUNITY)
Admission: EM | Admit: 2017-01-22 | Discharge: 2017-01-22 | Disposition: A | Discharge status: Home / Self Care | Payer: PRIVATE HEALTH INSURANCE | Attending: Emergency Medicine | Admitting: Emergency Medicine

## 2017-01-22 DIAGNOSIS — J209 Acute bronchitis, unspecified: Secondary | ICD-10-CM | POA: Diagnosis not present

## 2017-01-22 DIAGNOSIS — F17200 Nicotine dependence, unspecified, uncomplicated: Secondary | ICD-10-CM

## 2017-01-22 LAB — I-STAT CG4 LACTIC ACID, ED: Lactic Acid, Venous: 1.16 mmol/L (ref 0.5–1.9)

## 2017-01-22 LAB — HCG, QUANTITATIVE, PREGNANCY: hCG, Beta Chain, Quant, S: 7 m[IU]/mL — ABNORMAL HIGH (ref <5)

## 2017-01-22 MED ORDER — ALBUTEROL SULFATE HFA 108 (90 BASE) MCG/ACT IN AERS
2.0000 | INHALATION_SPRAY | RESPIRATORY_TRACT | Status: DC
  Administered 2017-01-22: 2 via RESPIRATORY_TRACT
  Filled 2017-01-22: qty 6.7

## 2017-01-22 MED ORDER — PREDNISONE 20 MG PO TABS: 40.0000 mg | ORAL_TABLET | Freq: Every day | ORAL | 0 refills | Status: AC

## 2017-01-22 MED ORDER — ALBUTEROL SULFATE (2.5 MG/3ML) 0.083% IN NEBU
5.0000 mg | INHALATION_SOLUTION | Freq: Once | RESPIRATORY_TRACT | Status: AC
  Administered 2017-01-22: 5 mg via RESPIRATORY_TRACT
  Filled 2017-01-22: qty 6

## 2017-01-22 MED ORDER — METHYLPREDNISOLONE SODIUM SUCC 125 MG IJ SOLR
125.0000 mg | Freq: Once | INTRAMUSCULAR | Status: AC
  Administered 2017-01-22: 125 mg via INTRAVENOUS
  Filled 2017-01-22: qty 2
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
## 2017-02-18 IMAGING — DX DG CHEST 2V
2 series · 2 of 2 positions shown · non-contrast
Comparison: 02/25/2016

CLINICAL DATA: Chest pain.

EXAM:
CHEST  2 VIEW

[chest pa]
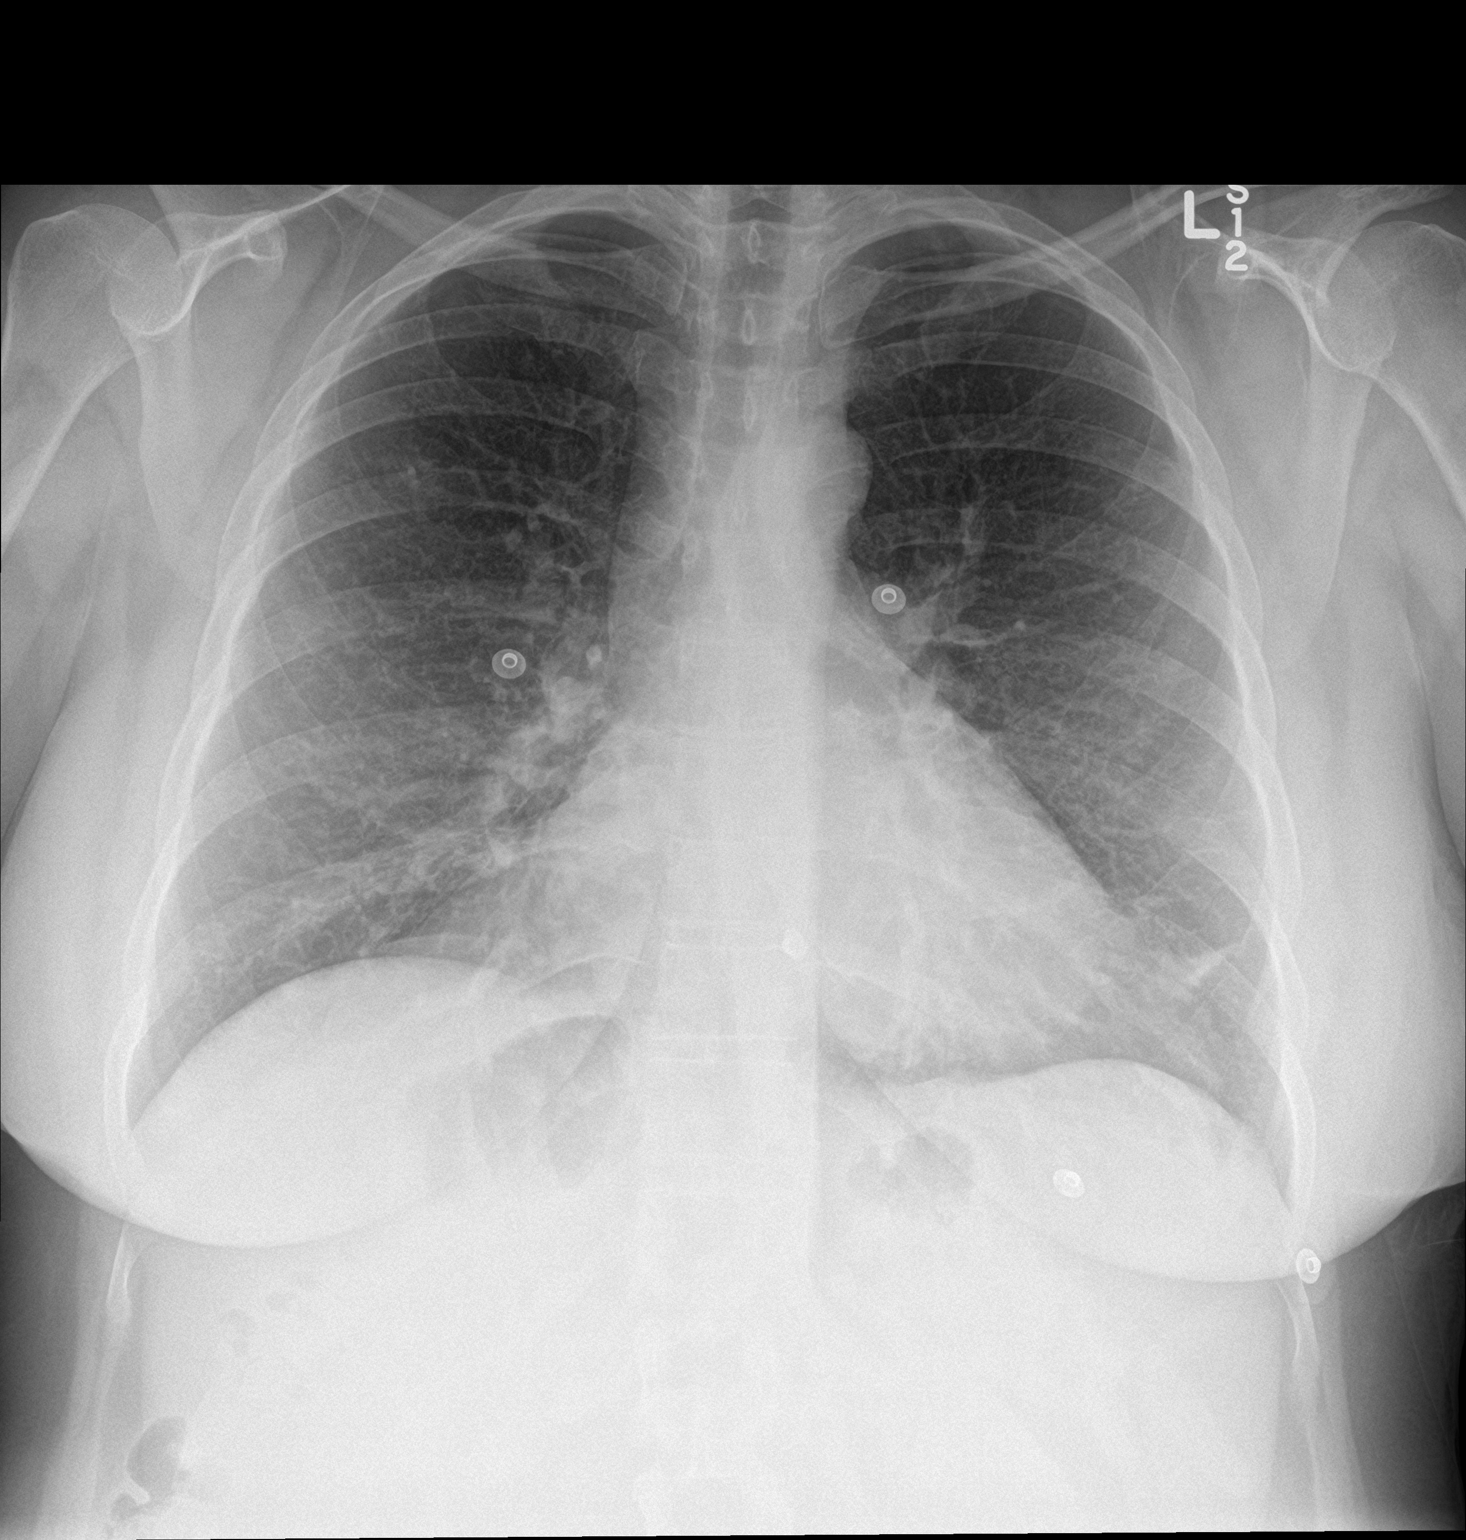

[chest lat]
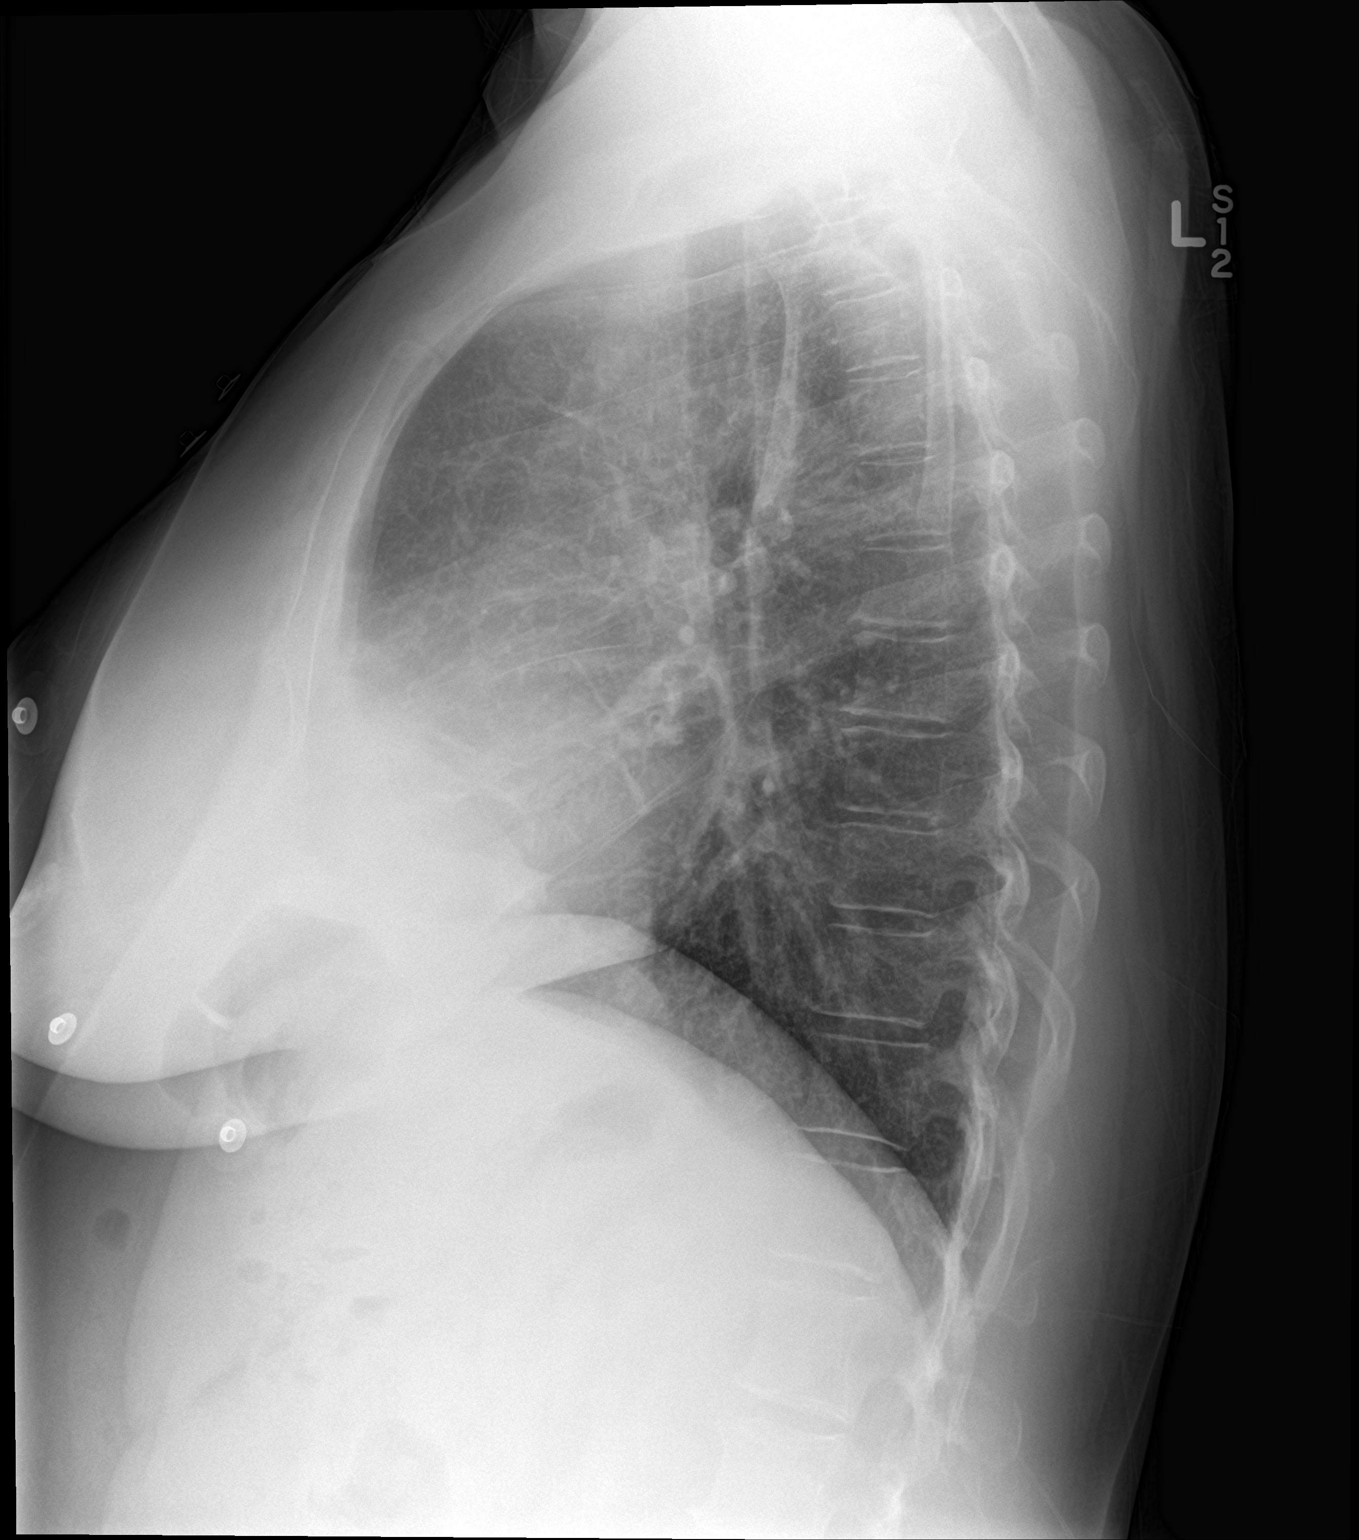

[2 of 2 positions shown; findings below may reference images not displayed]

FINDINGS: Heart size and pulmonary vascularity are normal. Minimal scarring in
the lingula. The lungs are otherwise clear. No effusions. No acute
bone abnormality.
IMPRESSION: No acute abnormalities.

## 2017-03-04 ENCOUNTER — Ambulatory Visit: Payer: PRIVATE HEALTH INSURANCE | Admitting: Gastroenterology

## 2017-03-20 ENCOUNTER — Other Ambulatory Visit: Payer: Self-pay | Admitting: Physician Assistant

## 2017-03-21 ENCOUNTER — Telehealth: Payer: Self-pay

## 2017-03-21 NOTE — Telephone Encounter (Signed)
Patient requesting to swtich Ranexa to something more affordable due to cost, 300/month with new insurance. Informed patient I would send to Dr. Radford Pax and Jinny Blossom, [facility] for further recommendations. Patient verbalized understanding and thanked me for the call.

## 2017-03-21 NOTE — Telephone Encounter (Signed)
Unfortunately no good alternative to Ranexa. She is currently taking Imdur and we could consider titrating this to help with symptoms.   Ranexa is due to go generic in a few weeks, but would not anticipate lower cost for about 6-9 months after generic available.

## 2017-03-22 NOTE — Telephone Encounter (Signed)
Unfortunately her BP will not tolerate uptitration of her Imdur and Ranexa Is only other med.  It will be going generic in another month although not sure price will change much in first year

## 2017-03-24 NOTE — Telephone Encounter (Signed)
Left message to call back  

## 2017-03-24 NOTE — Telephone Encounter (Signed)
Returned call to patient. Explained to patient that per [facility], [facility] no alternatives to Renexa. Patient also stated that the price of her Prasugrel has increased 176/month with a coupon for 100 off. Patient stated she will have to go without he medication if there are no other options. Patient stated she has applied for coupons as well. I informed patient I will forward to Dr.Turner for review. Patient verbalized understanding and thanked me for the call.

## 2017-03-24 NOTE — Telephone Encounter (Signed)
Stop Effient and change to Plavix 75mg  daily

## 2017-03-25 MED ORDER — CLOPIDOGREL BISULFATE 75 MG PO TABS: 75.0000 mg | ORAL_TABLET | Freq: Every day | ORAL | 0 refills | Status: DC

## 2017-03-25 NOTE — Telephone Encounter (Signed)
Returned call to patient. Instructed patient to STOP effient and START plavix 75 mg once a day. Explained to patient that unfortunately no alternative to Renexa at the moment and BP unable to tolerate Imdur. Patient requesting an appt to discuss long term treatment of angina. Patient scheduled with Ermalinda Barrios, PA on 04/14/17. Patient in agreement with plan and thanked me for the call.

## 2017-04-09 ENCOUNTER — Encounter (INDEPENDENT_AMBULATORY_CARE_PROVIDER_SITE_OTHER): Payer: Self-pay

## 2017-04-09 ENCOUNTER — Ambulatory Visit (INDEPENDENT_AMBULATORY_CARE_PROVIDER_SITE_OTHER): Payer: BLUE CROSS/BLUE SHIELD | Admitting: Gastroenterology

## 2017-04-09 ENCOUNTER — Encounter: Payer: Self-pay | Admitting: Gastroenterology

## 2017-04-09 ENCOUNTER — Telehealth: Payer: Self-pay | Admitting: Emergency Medicine

## 2017-04-09 VITALS — BP 98/78 | HR 70 | Ht 61.0 in | Wt 194.0 lb

## 2017-04-09 DIAGNOSIS — R109 Unspecified abdominal pain: Secondary | ICD-10-CM

## 2017-04-09 DIAGNOSIS — R112 Nausea with vomiting, unspecified: Secondary | ICD-10-CM

## 2017-04-09 DIAGNOSIS — R197 Diarrhea, unspecified: Secondary | ICD-10-CM

## 2017-04-09 MED ORDER — NA SULFATE-K SULFATE-MG SULF 17.5-3.13-1.6 GM/177ML PO SOLN: 1.0000 | ORAL | 0 refills | Status: DC

## 2017-04-09 MED ORDER — OMEPRAZOLE 40 MG PO CPDR: 40.0000 mg | DELAYED_RELEASE_CAPSULE | Freq: Two times a day (BID) | ORAL | 5 refills | Status: DC

## 2017-04-09 NOTE — Patient Instructions (Signed)
We have sent the following medications to your pharmacy for you to pick up at your convenience: Omeprazole 40 mg twice a day   Decrease Zantac to once daily at bedtime.   Stop Pantoprazole.   You have been scheduled for an endoscopy and colonoscopy. Please follow the written instructions given to you at your visit today. Please pick up your prep supplies at the pharmacy within the next 1-3 days. If you use inhalers (even only as needed), please bring them with you on the day of your procedure. Your physician has requested that you go to www.startemmi.com and enter the access code given to you at your visit today. This web site gives a general overview about your procedure. However, you should still follow specific instructions given to you by our office regarding your preparation for the procedure.
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
## 2017-04-09 NOTE — Telephone Encounter (Signed)
   Rhonda Brown Oct 25, 1965 [id number]    We have scheduled the above named patient for an endoscopy and colonoscopy procedure. Our records show that she is on anticoagulation therapy.  Please advise as to whether the patient may come off their therapy of Plavix 7 days prior to their procedure which is scheduled for 05-01-17.  Please route your response to Tinnie Gens, CMA or fax response to [phone].  Sincerely,    [facility]

## 2017-04-09 NOTE — Telephone Encounter (Signed)
Patient will need a followup prior to decision regarding her plavix, she already has a followup with Estella Husk 04/14/2017.

## 2017-04-10 NOTE — Telephone Encounter (Signed)
PT HAS APPT .Adonis Housekeeper

## 2017-04-14 ENCOUNTER — Encounter: Payer: Self-pay | Admitting: Physician Assistant

## 2017-04-14 ENCOUNTER — Ambulatory Visit (INDEPENDENT_AMBULATORY_CARE_PROVIDER_SITE_OTHER): Payer: BLUE CROSS/BLUE SHIELD | Admitting: Physician Assistant

## 2017-04-14 VITALS — BP 122/86 | HR 86 | Ht 61.0 in | Wt 195.0 lb

## 2017-04-14 DIAGNOSIS — Z720 Tobacco use: Secondary | ICD-10-CM

## 2017-04-14 DIAGNOSIS — I2583 Coronary atherosclerosis due to lipid rich plaque: Secondary | ICD-10-CM

## 2017-04-14 DIAGNOSIS — F1290 Cannabis use, unspecified, uncomplicated: Secondary | ICD-10-CM | POA: Diagnosis not present

## 2017-04-14 DIAGNOSIS — R079 Chest pain, unspecified: Secondary | ICD-10-CM

## 2017-04-14 DIAGNOSIS — I2510 Atherosclerotic heart disease of native coronary artery without angina pectoris: Secondary | ICD-10-CM | POA: Diagnosis not present

## 2017-04-14 DIAGNOSIS — E7800 Pure hypercholesterolemia, unspecified: Secondary | ICD-10-CM

## 2017-04-14 LAB — BASIC METABOLIC PANEL
BUN/Creatinine Ratio: 9 (ref 9–23)
BUN: 9 mg/dL (ref 6–24)
CALCIUM: 9.3 mg/dL (ref 8.7–10.2)
CO2: 24 mmol/L (ref 20–29)
Chloride: 105 mmol/L (ref 96–106)
Creatinine, Ser: 0.95 mg/dL (ref 0.57–1.00)
GFR, EST AFRICAN AMERICAN: 81 mL/min/{1.73_m2} (ref >59)
GFR, EST NON AFRICAN AMERICAN: 71 mL/min/{1.73_m2} (ref >59)
Glucose: 138 mg/dL — ABNORMAL HIGH (ref 65–99)
POTASSIUM: 3.7 mmol/L (ref 3.5–5.2)
Sodium: 143 mmol/L (ref 134–144)

## 2017-04-14 LAB — LIPID PANEL
CHOLESTEROL TOTAL: 179 mg/dL (ref 100–199)
Chol/HDL Ratio: 7.5 ratio — ABNORMAL HIGH (ref 0.0–4.4)
HDL: 24 mg/dL — ABNORMAL LOW (ref >39)
LDL CALC: 102 mg/dL — AB (ref 0–99)
TRIGLYCERIDES: 265 mg/dL — AB (ref 0–149)
VLDL Cholesterol Cal: 53 mg/dL — ABNORMAL HIGH (ref 5–40)

## 2017-04-14 MED ORDER — ISOSORBIDE MONONITRATE ER 30 MG PO TB24: 30.0000 mg | ORAL_TABLET | Freq: Every day | ORAL | 9 refills | Status: DC
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
## 2017-04-14 NOTE — Patient Instructions (Signed)
Medication Instructions:  Your physician has recommended you make the following change in your medication:  1. Discontinue Ranexa 2. Increase Imdur one tablet (30 mg ) daily, sent in today to patients requested pharmacy.    Labwork: Your physician recommends that you have lab work today: lipid/bmet   Testing/Procedures: Your physician has requested that you have a lexiscan myoview. For further information please visit [email or web address]. Please follow instruction sheet, as given.    Follow-Up: Your physician wants you to follow-up in: 6 months with Dr. Radford Pax.  You will receive a reminder letter in the mail two months in advance. If you don't receive a letter, please call our office to schedule the follow-up appointment.   Any Other Special Instructions Will Be Listed Below (If Applicable).  Patient may hold plavix for 7 days for colonoscopy, will send copy to GI Doctor.    If you need a refill on your cardiac medications before your next appointment, please call your pharmacy.  . Please follow instruction sheet, as given.

## 2017-04-14 NOTE — Telephone Encounter (Signed)
Patient instructed to hold Plavix for 7 days prior to colonoscopy per her appointment today. See note from Estella Husk, PA-C. Patient aware of instructions.

## 2017-04-15 ENCOUNTER — Ambulatory Visit (HOSPITAL_COMMUNITY): Payer: BLUE CROSS/BLUE SHIELD | Attending: Cardiology

## 2017-04-15 ENCOUNTER — Encounter: Payer: Self-pay | Admitting: Gastroenterology

## 2017-04-15 VITALS — Ht 61.0 in | Wt 195.0 lb

## 2017-04-15 DIAGNOSIS — R110 Nausea: Secondary | ICD-10-CM

## 2017-04-15 DIAGNOSIS — Z8249 Family history of ischemic heart disease and other diseases of the circulatory system: Secondary | ICD-10-CM | POA: Insufficient documentation

## 2017-04-15 DIAGNOSIS — R197 Diarrhea, unspecified: Secondary | ICD-10-CM | POA: Insufficient documentation

## 2017-04-15 DIAGNOSIS — R0789 Other chest pain: Secondary | ICD-10-CM | POA: Insufficient documentation

## 2017-04-15 DIAGNOSIS — R109 Unspecified abdominal pain: Secondary | ICD-10-CM | POA: Insufficient documentation

## 2017-04-15 DIAGNOSIS — R079 Chest pain, unspecified: Secondary | ICD-10-CM

## 2017-04-15 DIAGNOSIS — R0609 Other forms of dyspnea: Secondary | ICD-10-CM | POA: Diagnosis not present

## 2017-04-15 DIAGNOSIS — I2510 Atherosclerotic heart disease of native coronary artery without angina pectoris: Secondary | ICD-10-CM | POA: Insufficient documentation

## 2017-04-15 DIAGNOSIS — R112 Nausea with vomiting, unspecified: Secondary | ICD-10-CM | POA: Insufficient documentation

## 2017-04-15 LAB — MYOCARDIAL PERFUSION IMAGING
CHL CUP NUCLEAR SSS: 4
LVDIAVOL: 89 mL (ref 46–106)
LVSYSVOL: 35 mL
Peak HR: 114 {beats}/min
RATE: 0.37
Rest HR: 58 {beats}/min
SDS: 1
SRS: 3
TID: 1.03

## 2017-04-15 IMAGING — NM NM MISC PROCEDURE
6 series · 36 of 36 positions shown · non-contrast
Comparison: none

[Series 1: wbr_s-proj_st stress-sum-em · 6.40mm/px · 6 of 64 frames shown]
[frame 6/64]
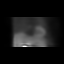
[frame 16/64]
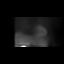
[frame 27/64]
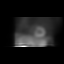
[frame 38/64]
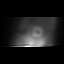
[frame 48/64]
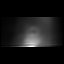
[frame 59/64]
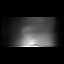

[Series 1: wbr_s-proj_st stress-gsp · 6.40mm/px · 6 of 512 frames shown]
[frame 43/512]
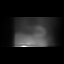
[frame 128/512]
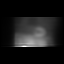
[frame 214/512]
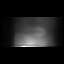
[frame 299/512]
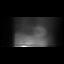
[frame 384/512]
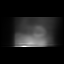
[frame 470/512]
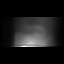

[Series 1: wbr_r-proj_st rest · 6.40mm/px · 6 of 64 frames shown]
[frame 6/64]
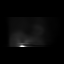
[frame 16/64]
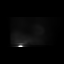
[frame 27/64]
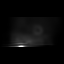
[frame 38/64]
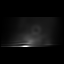
[frame 48/64]
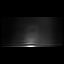
[frame 59/64]
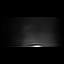

[Series 1: stress-sum-em · 6.40mm/px · 6 of 64 frames shown]
[frame 6/64]
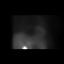
[frame 16/64]
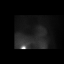
[frame 27/64]
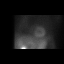
[frame 38/64]
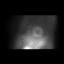
[frame 48/64]
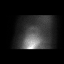
[frame 59/64]
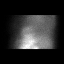

[Series 1: stress-gsp · 6.40mm/px · 6 of 506 frames shown]
[frame 43/506]
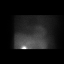
[frame 127/506]
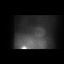
[frame 211/506]
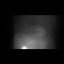
[frame 296/506]
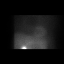
[frame 380/506]
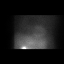
[frame 464/506]
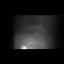

[Series 1: rest · 6.40mm/px · 6 of 64 frames shown]
[frame 6/64]
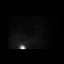
[frame 16/64]
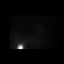
[frame 27/64]
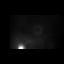
[frame 38/64]
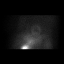
[frame 48/64]
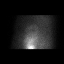
[frame 59/64]
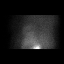

[36 of 36 positions shown; findings below may reference images not displayed]

Canned report from images found in remote index.

Refer to host system for actual result text.

## 2017-04-15 MED ORDER — REGADENOSON 0.4 MG/5ML IV SOLN
0.4000 mg | Freq: Once | INTRAVENOUS | Status: AC
  Administered 2017-04-15: 0.4 mg via INTRAVENOUS

## 2017-04-15 MED ORDER — TECHNETIUM TC 99M TETROFOSMIN IV KIT
10.6000 | PACK | Freq: Once | INTRAVENOUS | Status: AC | PRN
  Administered 2017-04-15: 10.6 via INTRAVENOUS
  Filled 2017-04-15: qty 11

## 2017-04-15 MED ORDER — TECHNETIUM TC 99M TETROFOSMIN IV KIT
31.8000 | PACK | Freq: Once | INTRAVENOUS | Status: AC | PRN
  Administered 2017-04-15: 31.8 via INTRAVENOUS
  Filled 2017-04-15: qty 32

## 2017-04-15 MED ORDER — AMINOPHYLLINE 25 MG/ML IV SOLN
75.0000 mg | Freq: Once | INTRAVENOUS | Status: AC
  Administered 2017-04-15: 75 mg via INTRAVENOUS

## 2017-04-16 ENCOUNTER — Telehealth: Payer: Self-pay | Admitting: Physician Assistant

## 2017-04-16 ENCOUNTER — Telehealth: Payer: Self-pay | Admitting: *Deleted

## 2017-04-16 NOTE — Telephone Encounter (Signed)
This pt was called for labs.  Stress test not resulted yet.  This is in [employer]. Tried to call pt line busy.  Will route to [place].

## 2017-04-16 NOTE — Telephone Encounter (Signed)
Informed patient she will be called when reviewed by her provider. She was grateful for call.

## 2017-04-16 NOTE — Progress Notes (Signed)
Thank you for sending this case to me. I have reviewed the entire note as well as the recent cardiology clinic note. She was continuing to have exertional dyspnea and intermittent chest pain treated with Imdur. Her nuclear stress test yesterday showed no ischemic changes, and result note attached to it indicates that she is able to have her EGD and colonoscopy, and to be off Plavix 7 days prior.  Please make arrangements for EGD and colonoscopy, but I would like it done in the hospital endoscopy lab due to her medical complexity. It can wait until my next available hospital outpatient slot.    Wilfrid Lund, MD

## 2017-04-16 NOTE — Telephone Encounter (Signed)
New Message   Patient is calling about the results of her stress test. Please call to discuss.

## 2017-04-17 NOTE — Progress Notes (Signed)
I have spoken to the patient and informed her of the procedures being moved to the hospital. She is agreeable with having it done on 06-03-17 at 10:30 am. I discussed arrival time to hospital and times to prep and stop eating. She verbalized understanding. Hospital orders were placed for this procedure.

## 2017-04-17 NOTE — Telephone Encounter (Signed)
Cardiology called to advise Dr Loletha Carrow that Rhonda Brown had a normal stress test and is cleared to have the Colonoscopy already scheduled. Report in Epic.

## 2017-04-17 NOTE — Addendum Note (Signed)
Addended by: Wyline Beady on: 04/17/2017 10:08 AM   Modules accepted: Orders, SmartSet

## 2017-04-18 NOTE — Telephone Encounter (Signed)
Late entry: I informed pt of test results on 2/20. See Myoview results.

## 2017-04-23 ENCOUNTER — Telehealth: Payer: Self-pay | Admitting: Gastroenterology

## 2017-04-23 NOTE — Telephone Encounter (Signed)
Patient states she is in a lot of pain and wants to know if procedure scheduled at [facility] for 4.9.19 can be moved up or if she can get advice till then. Pt states she ate a burger and fries last night around 6:30pm and vomited whole fries this morning. Please advise.

## 2017-04-23 NOTE — Telephone Encounter (Signed)
The pt is scheduled for endo colon on 06/03/17 with Dr Loletha Carrow, she is complaining of nausea and vomiting that is worse.  She does not want to wait until 06/03/17.  Dr Loletha Carrow has no sooner appts.  I did advise the pt to follow a full liquid diet.  Please advise

## 2017-04-23 NOTE — Telephone Encounter (Signed)
Routed to [place], as Lenise Arena last saw patient. Thank you.

## 2017-04-24 NOTE — Telephone Encounter (Signed)
Her symptoms have been present for a long time.  We can offer her zofran or phenergan if she does not have something for nausea.  I did not discuss this with her because I was waiting for her procedures to be done first, but she smokes a lot of marijuana and that can cause something called cyclic vomiting syndrome.  I would like to recommend for her to stop smoking marijuana until her procedure and see how she does.  Thank you,  Jess

## 2017-04-25 NOTE — Telephone Encounter (Signed)
I spoke with the pt and she informed me that she is actually feeling much better and will call back if her symptoms return

## 2017-05-01 ENCOUNTER — Encounter: Payer: BLUE CROSS/BLUE SHIELD | Admitting: Gastroenterology

## 2017-05-27 ENCOUNTER — Encounter (HOSPITAL_COMMUNITY): Payer: Self-pay | Admitting: *Deleted

## 2017-05-27 ENCOUNTER — Other Ambulatory Visit: Payer: Self-pay

## 2017-05-27 ENCOUNTER — Telehealth: Payer: Self-pay | Admitting: Gastroenterology

## 2017-05-27 NOTE — Telephone Encounter (Signed)
Left message for patient that we did communicate with cardiologist, patient has been okayed to stop Plavix 7 days prior. Instructed to stop taking Plavix today for 06/03/17 procedure.

## 2017-05-30 ENCOUNTER — Other Ambulatory Visit: Payer: Self-pay

## 2017-06-02 ENCOUNTER — Telehealth: Payer: Self-pay | Admitting: Gastroenterology

## 2017-06-02 ENCOUNTER — Other Ambulatory Visit: Payer: Self-pay

## 2017-06-02 DIAGNOSIS — R112 Nausea with vomiting, unspecified: Secondary | ICD-10-CM

## 2017-06-02 MED ORDER — METOCLOPRAMIDE HCL 5 MG PO TABS
5.0000 mg | ORAL_TABLET | Freq: Three times a day (TID) | ORAL | 0 refills | Status: DC | PRN
Start: 2017-06-02 — End: 2017-08-01

## 2017-06-02 MED ORDER — NA SULFATE-K SULFATE-MG SULF 17.5-3.13-1.6 GM/177ML PO SOLN: 1.0000 | ORAL | 0 refills | Status: DC

## 2017-06-02 NOTE — Telephone Encounter (Signed)
She woke up this morning with vomiting and diarrhea and is concerned that she will not be able to tolerate the colon prep.  She does want to proceed with the EGD.  Please advise.

## 2017-06-02 NOTE — Telephone Encounter (Signed)
Pt states [facility] has not received prescription for Reglan. States she has her bowel prep.

## 2017-06-02 NOTE — Telephone Encounter (Signed)
Notified by phone.  She is going to try to take the prep and will get the new rx from her pharmacy after work,  She is happy that she at a minimum will get the EGD done.

## 2017-06-02 NOTE — Telephone Encounter (Signed)
Reglan rx called to pharmacy.

## 2017-06-02 NOTE — Telephone Encounter (Signed)
I can do just the EGD if she would prefer.  If she thinks she can get through the bowel prep, it would be best to do both procedures both day given her medical issues and the fact that she has been off her Plavix 5 days prior.  If she wants to try that, please send prescription for metoclopramide 5 mg tablet, take one every 8 hours.  Disp: #3 tablets, RF : zero.  That way she could take one before prep, again before bed, and again with AM prep dose.  If she does not want to take the prep, then I will do just the EGD.

## 2017-06-03 ENCOUNTER — Ambulatory Visit (HOSPITAL_COMMUNITY)
Admission: RE | Admit: 2017-06-03 | Discharge: 2017-06-03 | Disposition: A | Discharge status: Home / Self Care | Payer: BLUE CROSS/BLUE SHIELD | Source: Ambulatory Visit | Attending: Gastroenterology | Admitting: Gastroenterology

## 2017-06-03 ENCOUNTER — Ambulatory Visit (HOSPITAL_COMMUNITY): Payer: BLUE CROSS/BLUE SHIELD | Admitting: Anesthesiology

## 2017-06-03 ENCOUNTER — Encounter (HOSPITAL_COMMUNITY): Payer: Self-pay

## 2017-06-03 ENCOUNTER — Encounter (HOSPITAL_COMMUNITY)
Admission: RE | Disposition: A | Discharge status: Home / Self Care | Payer: Self-pay | Source: Ambulatory Visit | Attending: Gastroenterology

## 2017-06-03 ENCOUNTER — Other Ambulatory Visit: Payer: Self-pay

## 2017-06-03 DIAGNOSIS — Z79899 Other long term (current) drug therapy: Secondary | ICD-10-CM | POA: Diagnosis not present

## 2017-06-03 DIAGNOSIS — Z7982 Long term (current) use of aspirin: Secondary | ICD-10-CM | POA: Insufficient documentation

## 2017-06-03 DIAGNOSIS — I2510 Atherosclerotic heart disease of native coronary artery without angina pectoris: Secondary | ICD-10-CM | POA: Insufficient documentation

## 2017-06-03 DIAGNOSIS — Z7902 Long term (current) use of antithrombotics/antiplatelets: Secondary | ICD-10-CM | POA: Insufficient documentation

## 2017-06-03 DIAGNOSIS — E785 Hyperlipidemia, unspecified: Secondary | ICD-10-CM | POA: Insufficient documentation

## 2017-06-03 DIAGNOSIS — R112 Nausea with vomiting, unspecified: Secondary | ICD-10-CM | POA: Insufficient documentation

## 2017-06-03 DIAGNOSIS — I252 Old myocardial infarction: Secondary | ICD-10-CM | POA: Insufficient documentation

## 2017-06-03 DIAGNOSIS — R197 Diarrhea, unspecified: Secondary | ICD-10-CM | POA: Insufficient documentation

## 2017-06-03 DIAGNOSIS — Z87891 Personal history of nicotine dependence: Secondary | ICD-10-CM | POA: Insufficient documentation

## 2017-06-03 DIAGNOSIS — R109 Unspecified abdominal pain: Secondary | ICD-10-CM

## 2017-06-03 HISTORY — PX: ESOPHAGOGASTRODUODENOSCOPY (EGD) WITH PROPOFOL: SHX5813

## 2017-06-03 SURGERY — ESOPHAGOGASTRODUODENOSCOPY (EGD) WITH PROPOFOL
Anesthesia: Monitor Anesthesia Care

## 2017-06-03 MED ORDER — PROPOFOL 10 MG/ML IV BOLUS
INTRAVENOUS | Status: AC
  Filled 2017-06-03: qty 40

## 2017-06-03 MED ORDER — PROPOFOL 10 MG/ML IV BOLUS
INTRAVENOUS | Status: DC | PRN
  Administered 2017-06-03: 20 mg via INTRAVENOUS
  Administered 2017-06-03: 30 mg via INTRAVENOUS
  Administered 2017-06-03: 20 mg via INTRAVENOUS

## 2017-06-03 MED ORDER — PROPOFOL 500 MG/50ML IV EMUL
INTRAVENOUS | Status: DC | PRN
  Administered 2017-06-03: 100 ug/kg/min via INTRAVENOUS

## 2017-06-03 MED ORDER — ONDANSETRON HCL 4 MG/2ML IJ SOLN
INTRAMUSCULAR | Status: AC
  Filled 2017-06-03: qty 2

## 2017-06-03 MED ORDER — ONDANSETRON HCL 4 MG/2ML IJ SOLN
4.0000 mg | Freq: Once | INTRAMUSCULAR | Status: AC
  Administered 2017-06-03: 4 mg via INTRAVENOUS

## 2017-06-03 MED ORDER — SCOPOLAMINE 1 MG/3DAYS TD PT72
1.0000 | MEDICATED_PATCH | TRANSDERMAL | Status: DC
  Administered 2017-06-03: 1.5 mg via TRANSDERMAL

## 2017-06-03 MED ORDER — LACTATED RINGERS IV SOLN
INTRAVENOUS | Status: DC
  Administered 2017-06-03: 1000 mL via INTRAVENOUS

## 2017-06-03 MED ORDER — PROPOFOL 10 MG/ML IV BOLUS
INTRAVENOUS | Status: AC
  Filled 2017-06-03: qty 20

## 2017-06-03 MED ORDER — LIDOCAINE 2% (20 MG/ML) 5 ML SYRINGE
INTRAMUSCULAR | Status: DC | PRN
  Administered 2017-06-03: 75 mg via INTRAVENOUS

## 2017-06-03 MED ORDER — SCOPOLAMINE 1 MG/3DAYS TD PT72
MEDICATED_PATCH | TRANSDERMAL | Status: AC
  Filled 2017-06-03: qty 1

## 2017-06-03 MED ORDER — DEXAMETHASONE SODIUM PHOSPHATE 10 MG/ML IJ SOLN
10.0000 mg | Freq: Once | INTRAMUSCULAR | Status: AC
  Administered 2017-06-03: 10 mg via INTRAVENOUS
  Filled 2017-06-03: qty 1

## 2017-06-03 MED ORDER — SODIUM CHLORIDE 0.9 % IV SOLN: INTRAVENOUS | Status: DC

## 2017-06-03 SURGICAL SUPPLY — 24 items

## 2017-06-03 NOTE — Op Note (Signed)
[facility] Patient Name: Rhonda Brown Procedure Date: 06/03/2017 MRN: [id number] Attending MD: Estill Cotta. Loletha Carrow , MD Date of Birth: 1966-02-01 CSN: [id number] Age: 52 Admit Type: Outpatient Procedure:                Upper GI endoscopy Indications:              Diarrhea, Nausea with vomiting Providers:                Mallie Mussel L. Loletha Carrow, MD, Cleda Daub, RN, Cherylynn Ridges, Technician, Virgia Land, CRNA Referring MD:              Medicines:                Monitored Anesthesia Care Complications:            No immediate complications. Estimated Blood Loss:     Estimated blood loss was minimal. Procedure:                Pre-Anesthesia Assessment:                           - Prior to the procedure, a History and Physical                            was performed, and patient medications and                            allergies were reviewed. The patient's tolerance of                            previous anesthesia was also reviewed. The risks                            and benefits of the procedure and the sedation                            options and risks were discussed with the patient.                            All questions were answered, and informed consent                            was obtained. Prior Anticoagulants: The patient has                            taken Effient (prasugrel), last dose was 5 days                            prior to procedure. ASA Grade Assessment: III - A                            patient with severe systemic disease. After  reviewing the risks and benefits, the patient was                            deemed in satisfactory condition to undergo the                            procedure.                           After obtaining informed consent, the endoscope was                            passed under direct vision. Throughout the                            procedure, the patient's  blood pressure, pulse, and                            oxygen saturations were monitored continuously. The                            EG-[id number] ([id number]) scope was introduced through the                            mouth, and advanced to the second part of duodenum.                            The upper GI endoscopy was performed with                            difficulty due to the patient's combativeness. The                            patient tolerated the procedure poorly due to the                            patient's agitation. Scope In: Scope Out: Findings:      The esophagus was normal.      The stomach was normal.      The cardia and gastric fundus were normal on retroflexion.      The examined duodenum was normal. Biopsies for histology were taken with       a cold forceps for evaluation of celiac disease. Impression:               - Normal esophagus.                           - Normal stomach.                           - Normal examined duodenum. Biopsied.                           A colonoscopy was planned, but it was not attempted  because the patient was not able to tolerate the                            bowel preparation and was still passing stool. Moderate Sedation:      MAC sedation used Recommendation:           - Patient has a contact number available for                            emergencies. The signs and symptoms of potential                            delayed complications were discussed with the                            patient. Return to normal activities tomorrow.                            Written discharge instructions were provided to the                            patient.                           - Resume previous diet.                           - Resume Effient (prasugrel) at prior dose today.                           - Await pathology results. Procedure Code(s):        --- Professional ---                           [phone],  Esophagogastroduodenoscopy, flexible,                            transoral; with biopsy, single or multiple Diagnosis Code(s):        --- Professional ---                           R19.7, Diarrhea, unspecified                           R11.2, Nausea with vomiting, unspecified CPT copyright 2017 American Medical Association. All rights reserved. The codes documented in this report are preliminary and upon coder review may  be revised to meet current compliance requirements. Buffi Ewton L. Loletha Carrow, MD 06/03/2017 11:27:53 AM This report has been signed electronically. Number of Addenda: 0

## 2017-06-03 NOTE — Progress Notes (Addendum)
I spoke with Rhonda Brown and her daughter after the EGD with the patient awake and fully recovered from sedation.  Results of EGD were given, told biopsies taken of small bowel.  Could not perform colonoscopy because she was still passing stool.  I explained plan to wait for biopsies, then I may pursue a gastric emptying study.  She asked for metoclopramide, but I declined to do so until further testing and until and unless I am sure that appears to be the correct chronic medication for her, especially since there is a risk of cardiac arythmmia (last QTc 477 12/2016). For that reason, I also advised her to stop using marijuana since cannabis hyperemesis could cause these symptoms.  She became irate and felt there have been delays in her care and that to advise stopping marijuana was a "lame-ass excuse".  Her daughter stated "she isn't going to stop".  I gave her my advice again as well as a copy of the report.

## 2017-06-03 NOTE — Interval H&P Note (Signed)
History and Physical Interval Note:  06/03/2017 10:47 AM  Rhonda Brown  has presented today for surgery, with the diagnosis of nausea/vomiting, diarrhea, abd pain/db  The various methods of treatment have been discussed with the patient and family. After consideration of risks, benefits and other options for treatment, the patient has consented to  Procedure(s): COLONOSCOPY WITH PROPOFOL (N/A) ESOPHAGOGASTRODUODENOSCOPY (EGD) WITH PROPOFOL (N/A) as a surgical intervention .  The patient's history has been reviewed, patient examined, no change in status, stable for surgery.  I have reviewed the patient's chart and labs.  Questions were answered to the patient's satisfaction.     Nelida Meuse III
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
## 2017-06-03 NOTE — Discharge Instructions (Signed)
YOU HAD AN ENDOSCOPIC PROCEDURE TODAY: Refer to the procedure report and other information in the discharge instructions given to you for any specific questions about what was found during the examination. If this information does not answer your questions, please call [place] office at [phone] to clarify.   YOU SHOULD EXPECT: Some feelings of bloating in the abdomen. Passage of more gas than usual. Walking can help get rid of the air that was put into your GI tract during the procedure and reduce the bloating. If you had a lower endoscopy (such as a colonoscopy or flexible sigmoidoscopy) you may notice spotting of blood in your stool or on the toilet paper. Some abdominal soreness may be present for a day or two, also.  DIET: Your first meal following the procedure should be a light meal and then it is ok to progress to your normal diet. A half-sandwich or bowl of soup is an example of a good first meal. Heavy or fried foods are harder to digest and may make you feel nauseous or bloated. Drink plenty of fluids but you should avoid alcoholic beverages for 24 hours. If you had a esophageal dilation, please see attached instructions for diet.    ACTIVITY: Your care partner should take you home directly after the procedure. You should plan to take it easy, moving slowly for the rest of the day. You can resume normal activity the day after the procedure however YOU SHOULD NOT DRIVE, use power tools, machinery or perform tasks that involve climbing or major physical exertion for 24 hours (because of the sedation medicines used during the test).   SYMPTOMS TO REPORT IMMEDIATELY: A gastroenterologist can be reached at any hour. Please call [phone]  for any of the following symptoms:  Following lower endoscopy (colonoscopy, flexible sigmoidoscopy) Excessive amounts of blood in the stool  Significant tenderness, worsening of abdominal pains  Swelling of the abdomen that is new, acute  Fever of 100 or  higher  Following upper endoscopy (EGD, EUS, ERCP, esophageal dilation) Vomiting of blood or coffee ground material  New, significant abdominal pain  New, significant chest pain or pain under the shoulder blades  Painful or persistently difficult swallowing  New shortness of breath  Black, tarry-looking or red, bloody stools  FOLLOW UP:  If any biopsies were taken you will be contacted by phone or by letter within the next 1-3 weeks. Call [phone]  if you have not heard about the biopsies in 3 weeks.  Please also call with any specific questions about appointments or follow up tests. Resume either the clopidogrel (also called plavix) or the prasugrel (also called effient), whichever one of those you are currently taking.Begin it today.

## 2017-06-03 NOTE — Anesthesia Preprocedure Evaluation (Signed)
Anesthesia Evaluation  Patient identified by MRN, date of birth, ID band Patient awake    Reviewed: Allergy & Precautions, NPO status , Patient's Chart, lab work & pertinent test results  Airway Mallampati: I       Dental  (+) Missing, Poor Dentition,    Pulmonary former smoker,    Pulmonary exam normal breath sounds clear to auscultation       Cardiovascular Normal cardiovascular exam Rhythm:Regular Rate:Normal     Neuro/Psych negative psych ROS   GI/Hepatic Neg liver ROS,   Endo/Other  negative endocrine ROS  Renal/GU negative Renal ROS  negative genitourinary   Musculoskeletal negative musculoskeletal ROS (+)   Abdominal (+) + obese,   Peds  Hematology negative hematology ROS (+)   Anesthesia Other Findings Rhonda Brown  GATED SPECT Rhonda Brown PERF [facility] STRESS 1D  Order# [id number]  Reading physician: Rhonda Spark, MD Ordering physician: Rhonda Burn, PA-C Study date: 04/15/17 Patient Information   Name MRN Description Rhonda Brown "Rhonda Brown" [id number] 52 y.o. Female Result Notes for Myocardial Perfusion Imaging   Notes recorded by Rhonda Brown, CMA on 04/16/2017 at 4:53 PM EST I called [facility] and informed pt has been cleared. Results are in Epic for the doctor to review clearance on Myoview. ------  Notes recorded by Rhonda Brown, CMA on 04/16/2017 at 3:13 PM EST Pt has been notified of lab and Myoview results by phone with verbal understanding. Pt does confirm she is still taking her Lipitor. Pt is agreeable to referral to [facility] Clinic. I will place referral today. Advised pt [facility] will call her with an appt to [facility]. Pt thanked me for my call. Pt aware she is cleared for her colonoscopy and endoscopy. I will fax Myoview results stating clearance. ------  Notes recorded by Rhonda Burn, PA-C on 04/16/2017 at 12:42 PM EST Nuclear stress test completely normal. Patient can proceed  with colonoscopy and endoscopy. Please fax this to GI.      Reproductive/Obstetrics                             Anesthesia Physical Anesthesia Plan  ASA: II  Anesthesia Plan: MAC   Post-op Pain Management:    Induction:   PONV Risk Score and Plan: 2 and Ondansetron, Dexamethasone and Scopolamine patch - Pre-op  Airway Management Planned: Natural Airway and Simple Face Mask  Additional Equipment:   Intra-op Plan:   Post-operative Plan:   Informed Consent: I have reviewed the patients History and Physical, chart, labs and discussed the procedure including the risks, benefits and alternatives for the proposed anesthesia with the patient or authorized representative who has indicated his/her understanding and acceptance.   Dental advisory given  Plan Discussed with: CRNA  Anesthesia Plan Comments:         Anesthesia Quick Evaluation

## 2017-06-03 NOTE — Anesthesia Procedure Notes (Signed)
Procedure Name: MAC Date/Time: 06/03/2017 11:05 AM Performed by: Lollie Sails, CRNA Pre-anesthesia Checklist: Patient identified, Emergency Drugs available, Suction available and Patient being monitored Oxygen Delivery Method: Nasal cannula

## 2017-06-03 NOTE — Transfer of Care (Signed)
Immediate Anesthesia Transfer of Care Note  Patient: Rhonda Brown  Procedure(s) Performed: ESOPHAGOGASTRODUODENOSCOPY (EGD) WITH PROPOFOL (N/A )  Patient Location: PACU  Anesthesia Type:MAC  Level of Consciousness: awake, alert  and oriented  Airway & Oxygen Therapy: Patient Spontanous Breathing and Patient connected to nasal cannula oxygen  Post-op Assessment: Report given to RN and Post -op Vital signs reviewed and stable  Post vital signs: Reviewed and stable  Last Vitals:  Vitals Value Taken Time  BP    Temp    Pulse 81 06/03/2017 11:28 AM  Resp 24 06/03/2017 11:28 AM  SpO2 94 % 06/03/2017 11:28 AM  Vitals shown include unvalidated device data.  Last Pain:  Vitals:   06/03/17 0953  TempSrc: Oral  PainSc: 5          Complications: No apparent anesthesia complications

## 2017-06-03 NOTE — Progress Notes (Signed)
Pt feeling nausea . Dr. Clide Cliff to room . Order for meds MAR to be given. Pt given meds per order.  At 1050 pt states that nausea is much better. Will continue to assess.

## 2017-06-03 NOTE — Anesthesia Postprocedure Evaluation (Signed)
Anesthesia Post Note  Patient: Rhonda Brown  Procedure(s) Performed: ESOPHAGOGASTRODUODENOSCOPY (EGD) WITH PROPOFOL (N/A )     Patient location during evaluation: Endoscopy Anesthesia Type: MAC Level of consciousness: sedated Pain management: pain level controlled Vital Signs Assessment: post-procedure vital signs reviewed and stable Respiratory status: spontaneous breathing Cardiovascular status: stable Postop Assessment: adequate PO intake Anesthetic complications: no    Last Vitals:  Vitals:   06/03/17 0953 06/03/17 1128  BP: 116/71 (!) 102/48  Pulse: 81 82  Resp: 14 14  Temp: 36.6 C 37 C  SpO2: 92% 94%    Last Pain:  Vitals:   06/03/17 1128  TempSrc: Oral  PainSc:    Pain Goal:                 Ahnna Dungan JR,JOHN Liisa Picone

## 2017-06-04 ENCOUNTER — Encounter (HOSPITAL_COMMUNITY): Payer: Self-pay | Admitting: Gastroenterology

## 2017-06-08 ENCOUNTER — Telehealth: Payer: Self-pay | Admitting: Gastroenterology

## 2017-06-08 NOTE — Telephone Encounter (Signed)
Her last medication list when I saw her last showed she is on Reglan already.  Do you know if she was taking Reglan when that Ekg was done

## 2017-06-08 NOTE — Telephone Encounter (Signed)
Dr. Radford Pax,    This patient has been seeing [place] for chronic nausea and vomiting. She may have gastroparesis or a gastric dysrhythmia , EGD unrevealing, gastric emptying study planned.    Her last EKG Nov 2018 has a qTc = 433ms.    What do you feel is the risk of using metoclopramide chronically , if that seems to be the right therapy?  And would any particular EKG monitoring be necessary if she takes it?  Thanks for your input.

## 2017-06-09 ENCOUNTER — Other Ambulatory Visit: Payer: Self-pay

## 2017-06-09 DIAGNOSIS — R112 Nausea with vomiting, unspecified: Secondary | ICD-10-CM

## 2017-06-09 NOTE — Telephone Encounter (Signed)
Please have patient come in for baseline EKG

## 2017-06-09 NOTE — Telephone Encounter (Signed)
I spoke with patient, pt unable to come in today because she is at work. Patient is scheduled for EKG on 06/11/17 at [facility]. Patient verbalized understanding and thankful for the call

## 2017-06-09 NOTE — Telephone Encounter (Signed)
I do not know, since I did not prescribe that med for her at that time. She had 2-3 doses last week in hopes of tolerating bowel prep, but is not on it now. If you feel a new baseline EKG is needed to answer this question, please feel free to arrange.  Thanks very much.  - HD

## 2017-06-11 ENCOUNTER — Ambulatory Visit (INDEPENDENT_AMBULATORY_CARE_PROVIDER_SITE_OTHER): Payer: BLUE CROSS/BLUE SHIELD

## 2017-06-11 DIAGNOSIS — Z79899 Other long term (current) drug therapy: Secondary | ICD-10-CM

## 2017-06-11 NOTE — Patient Instructions (Signed)
Medication Instructions:  Your physician recommends that you continue on your current medications as directed. Please refer to the Current Medication list given to you today.  If you need a refill on your cardiac medications, please contact your pharmacy first.  Labwork: None ordered   Testing/Procedures: We performed a baseline EKG today   Follow-Up: Your physician wants you to follow-up as scheduled with Dr. Radford Pax   Any Other Special Instructions Will Be Listed Below (If Applicable).   Thank you for choosing [place], RN  [phone]  If you need a refill on your cardiac medications before your next appointment, please call your pharmacy.

## 2017-06-11 NOTE — Progress Notes (Signed)
1.) Reason for visit: baseline EKG  2.) Name of MD requesting visit: Dr. Radford Pax  3.) H&P: Patient has h/o CAD status post MI treated with DES to the LAD 01/2016, HLD, and chest pain   4.) ROS related to problem: Patient presented to visit in NAD, no new complaints. Patient here for baseline EKG prior to starting Reglan chronically for gastroparesis.   5.) Assessment and plan per MD: EKG performed, reviewed by DOD (Dr. Johnsie Cancel). No new changes at this time. Will forward to Dr. Radford Pax for review. Patient left in NAD. Patient informed to f/u as scheduled.

## 2017-06-12 ENCOUNTER — Other Ambulatory Visit: Payer: Self-pay

## 2017-06-12 ENCOUNTER — Encounter: Payer: Self-pay | Admitting: Gastroenterology

## 2017-06-12 ENCOUNTER — Telehealth: Payer: Self-pay | Admitting: Gastroenterology

## 2017-06-12 MED ORDER — ONDANSETRON HCL 8 MG PO TABS: 8.0000 mg | ORAL_TABLET | Freq: Three times a day (TID) | ORAL | 1 refills | Status: DC | PRN

## 2017-06-12 NOTE — Telephone Encounter (Signed)
Pt calling regarding message that she sent this morning through my chart, she is feeling very sick and wants to know what to do. She is aware that her message was sent to Dr. Loletha Carrow and we are waiting for a response from him to call her back.

## 2017-06-12 NOTE — Telephone Encounter (Signed)
Spoke to patient, she is not very happy about this being the only solution. Explained that Dr. Loletha Carrow needs to get the GES results, she is scheduled for this test on 06/18/17.

## 2017-06-12 NOTE — Telephone Encounter (Signed)
Patient calling back regarding this.  °

## 2017-06-12 NOTE — Telephone Encounter (Signed)
Patient calling back, sent email earlier this morning.

## 2017-06-12 NOTE — Telephone Encounter (Signed)
I have been doing procedures since early this morning.  GES not done yet, no final answer yet from cardiology about using metoclopramide.  Rx:  Ondansetron 8 mg.  Sign, one tablet every 8 hours as needed for nausea and vomiting. Disp #45, RF 1

## 2017-06-18 ENCOUNTER — Encounter (HOSPITAL_COMMUNITY)
Admission: RE | Admit: 2017-06-18 | Discharge: 2017-06-18 | Disposition: A | Discharge status: Home / Self Care | Payer: BLUE CROSS/BLUE SHIELD | Source: Ambulatory Visit | Attending: Gastroenterology | Admitting: Gastroenterology

## 2017-06-18 DIAGNOSIS — R6881 Early satiety: Secondary | ICD-10-CM | POA: Diagnosis not present

## 2017-06-18 DIAGNOSIS — R112 Nausea with vomiting, unspecified: Secondary | ICD-10-CM

## 2017-06-19 ENCOUNTER — Telehealth: Payer: Self-pay | Admitting: Gastroenterology

## 2017-06-19 NOTE — Telephone Encounter (Signed)
Called patient to let her know that Dr. Loletha Carrow is out of the office this week and will be able to look over the results on Monday when he returns. Patient is not happy with this response. I tried to explain that the physicians like to review their patient's results and make recommendations. Patient upset as she has not been feeling well since January. Patient hung up on me while talking to her.

## 2017-06-19 NOTE — Telephone Encounter (Signed)
Patient wanting results from gastric emptying done yesterday 4.24.19.

## 2017-06-20 ENCOUNTER — Other Ambulatory Visit: Payer: Self-pay

## 2017-06-20 MED ORDER — METOCLOPRAMIDE HCL 5 MG PO TABS: 5.0000 mg | ORAL_TABLET | Freq: Three times a day (TID) | ORAL | 0 refills | Status: DC

## 2017-06-30 ENCOUNTER — Ambulatory Visit (INDEPENDENT_AMBULATORY_CARE_PROVIDER_SITE_OTHER): Payer: BLUE CROSS/BLUE SHIELD

## 2017-06-30 ENCOUNTER — Telehealth: Payer: Self-pay

## 2017-06-30 VITALS — BP 130/83 | HR 70 | Ht 61.0 in | Wt 189.0 lb

## 2017-06-30 DIAGNOSIS — R112 Nausea with vomiting, unspecified: Secondary | ICD-10-CM | POA: Diagnosis not present

## 2017-06-30 MED ORDER — METOCLOPRAMIDE HCL 5 MG PO TABS: 5.0000 mg | ORAL_TABLET | Freq: Three times a day (TID) | ORAL | 0 refills | Status: DC

## 2017-06-30 NOTE — Telephone Encounter (Signed)
Please see the message patient sent me through portal last week and my reply. It is not clear how much the metoclopramide is helping.    EKG stable, so I would like to continue metoclopramide at current dose of 5 mg three times daily for another week.  If she is not substantially better, I will refer her to a surgeon because the gallstones seen on imaging last year when symptoms began may be part of the cause.  I reviewed that in my response to the patient.  I have sent a prescription to her pharmacy through the EMR.   - HD

## 2017-06-30 NOTE — Telephone Encounter (Signed)
Sonia Baller from [facility] faxed over EKG, patient on Reglan, today was last dose. Patient reported to her that she was 50% better symptoms wise. Nurse faxed over EKG, reported no changes. Copy on your desk. Please advise.

## 2017-06-30 NOTE — Progress Notes (Signed)
1.) Reason for visit: Repeat EKG after starting Reglan 5 mg PO TID  2.) Name of MD requesting visit: Dr. Wilfrid Lund  3.) ROS related to problem: Per Pt she has been taking the Reglan as prescribed for 7 days.  Pt denies any s/s of tardive dyskinesia.  Pt states that she feels about 50% better since starting the Reglan.  She thinks she should be on Reglan 10 mg PO TID.    4.) Assessment and plan per MD: DOD reviewed EKG.  QT WNL.  Call placed to Dr. Loletha Carrow office.  Notified EKG complete.  Will fax copy at this time.  Also will have EKG scanned in.  Dr. Loletha Carrow office to f/u with Pt after EKG reviewed.

## 2017-06-30 NOTE — Addendum Note (Signed)
Addended by: Nelida Meuse on: 06/30/2017 01:21 PM   Modules accepted: Orders

## 2017-06-30 NOTE — Patient Instructions (Signed)
Medication Instructions:  Your physician recommends that you continue on your current medications as directed. Please refer to the Current Medication list given to you today.  Labwork: None ordered.  Testing/Procedures: You got an EKG today.  Follow-Up:  With Dr. Loletha Carrow as scheduled.   Any Other Special Instructions Will Be Listed Below (If Applicable).  If you need a refill on your cardiac medications before your next appointment, please call your pharmacy.

## 2017-07-02 ENCOUNTER — Other Ambulatory Visit: Payer: Self-pay | Admitting: Cardiology

## 2017-07-03 ENCOUNTER — Emergency Department (HOSPITAL_COMMUNITY): Payer: BLUE CROSS/BLUE SHIELD

## 2017-07-03 ENCOUNTER — Other Ambulatory Visit: Payer: Self-pay

## 2017-07-03 ENCOUNTER — Encounter (HOSPITAL_COMMUNITY): Payer: Self-pay | Admitting: Emergency Medicine

## 2017-07-03 ENCOUNTER — Emergency Department (HOSPITAL_COMMUNITY)
Admission: EM | Admit: 2017-07-03 | Discharge: 2017-07-03 | Disposition: A | Discharge status: Home / Self Care | Payer: BLUE CROSS/BLUE SHIELD | Attending: Emergency Medicine | Admitting: Emergency Medicine

## 2017-07-03 DIAGNOSIS — Z955 Presence of coronary angioplasty implant and graft: Secondary | ICD-10-CM | POA: Insufficient documentation

## 2017-07-03 DIAGNOSIS — R1310 Dysphagia, unspecified: Secondary | ICD-10-CM | POA: Diagnosis not present

## 2017-07-03 DIAGNOSIS — Z7982 Long term (current) use of aspirin: Secondary | ICD-10-CM | POA: Insufficient documentation

## 2017-07-03 DIAGNOSIS — Z79899 Other long term (current) drug therapy: Secondary | ICD-10-CM | POA: Insufficient documentation

## 2017-07-03 DIAGNOSIS — I2510 Atherosclerotic heart disease of native coronary artery without angina pectoris: Secondary | ICD-10-CM | POA: Insufficient documentation

## 2017-07-03 DIAGNOSIS — F17210 Nicotine dependence, cigarettes, uncomplicated: Secondary | ICD-10-CM | POA: Insufficient documentation

## 2017-07-03 DIAGNOSIS — M542 Cervicalgia: Secondary | ICD-10-CM | POA: Insufficient documentation

## 2017-07-03 DIAGNOSIS — J029 Acute pharyngitis, unspecified: Secondary | ICD-10-CM

## 2017-07-03 LAB — CBC WITH DIFFERENTIAL/PLATELET
BASOS ABS: 0.1 10*3/uL (ref 0.0–0.1)
BASOS PCT: 0 %
EOS ABS: 0.9 10*3/uL — AB (ref 0.0–0.7)
Eosinophils Relative: 7 %
HCT: 46.3 % — ABNORMAL HIGH (ref 36.0–46.0)
HEMOGLOBIN: 15.7 g/dL — AB (ref 12.0–15.0)
Lymphocytes Relative: 22 %
Lymphs Abs: 2.8 10*3/uL (ref 0.7–4.0)
MCH: 28 pg (ref 26.0–34.0)
MCHC: 33.9 g/dL (ref 30.0–36.0)
MCV: 82.5 fL (ref 78.0–100.0)
Monocytes Absolute: 1 10*3/uL (ref 0.1–1.0)
Monocytes Relative: 8 %
Neutro Abs: 8.1 10*3/uL — ABNORMAL HIGH (ref 1.7–7.7)
Neutrophils Relative %: 63 %
Platelets: 204 10*3/uL (ref 150–400)
RBC: 5.61 MIL/uL — AB (ref 3.87–5.11)
RDW: 15.6 % — ABNORMAL HIGH (ref 11.5–15.5)
WBC: 12.8 10*3/uL — AB (ref 4.0–10.5)

## 2017-07-03 LAB — COMPREHENSIVE METABOLIC PANEL
ALK PHOS: 117 U/L (ref 38–126)
ALT: 28 U/L (ref 14–54)
AST: 24 U/L (ref 15–41)
Albumin: 3.7 g/dL (ref 3.5–5.0)
Anion gap: 10 (ref 5–15)
BUN: 6 mg/dL (ref 6–20)
CALCIUM: 9.5 mg/dL (ref 8.9–10.3)
CO2: 21 mmol/L — ABNORMAL LOW (ref 22–32)
CREATININE: 0.79 mg/dL (ref 0.44–1.00)
Chloride: 111 mmol/L (ref 101–111)
GFR calc Af Amer: >60 mL/min (ref >60)
Glucose, Bld: 107 mg/dL — ABNORMAL HIGH (ref 65–99)
Potassium: 3.6 mmol/L (ref 3.5–5.1)
Sodium: 142 mmol/L (ref 135–145)
Total Bilirubin: 0.8 mg/dL (ref 0.3–1.2)
Total Protein: 7 g/dL (ref 6.5–8.1)

## 2017-07-03 LAB — TSH: TSH: 1.093 u[IU]/mL (ref 0.350–4.500)

## 2017-07-03 LAB — GROUP A STREP BY PCR: Group A Strep by PCR: NOT DETECTED

## 2017-07-03 MED ORDER — HYDROCODONE-ACETAMINOPHEN 5-325 MG PO TABS
1.0000 | ORAL_TABLET | Freq: Once | ORAL | Status: AC
  Administered 2017-07-03: 1 via ORAL
  Filled 2017-07-03: qty 1

## 2017-07-03 MED ORDER — IOHEXOL 300 MG/ML  SOLN
100.0000 mL | Freq: Once | INTRAMUSCULAR | Status: AC | PRN
Start: 2017-07-03 — End: 2017-07-03
  Administered 2017-07-03: 75 mL via INTRAVENOUS

## 2017-07-03 MED ORDER — METHOCARBAMOL 500 MG PO TABS: 500.0000 mg | ORAL_TABLET | Freq: Two times a day (BID) | ORAL | 0 refills | Status: DC

## 2017-07-03 MED ORDER — GI COCKTAIL ~~LOC~~
30.0000 mL | Freq: Once | ORAL | Status: AC
  Administered 2017-07-03: 30 mL via ORAL
  Filled 2017-07-03: qty 30

## 2017-07-03 NOTE — ED Notes (Signed)
Pt called in order to reassess vitals, Pt is not present in the waiting room.

## 2017-07-03 NOTE — ED Triage Notes (Signed)
Pt states she started having pain/lump in her throat two days ago. Yesterday pt woke up with a stiff neck. Pt the lump in her throat has progressively gotten more painful. Airway intact. Pt started on a new medication (metoclopramide 5mg ) for gastric paralysis approx 9 days ago.

## 2017-07-03 NOTE — ED Provider Notes (Signed)
Patient placed in Quick Look pathway, seen and evaluated   Chief Complaint: Neck pain  HPI:   Patient presents to the ED for 3 days of neck pain that is progressively worsening.  She reports neck stiffness.  She also reports some sore throat and painful swallowing at times.  Feels like "a lump is in her throat".  Patient denies any difficulties breathing.  Managing secretions tolerating airway.  States that her symptoms started after taking Reglan for the first time last week for gastroparesis.  Patient reports chills but denies any known fevers.  Reports intermittent headache but denies any associated photophobia or vision changes.  Patient denies any chest pain or shortness of breath.  No known thyroid disease.  ROS: Positive for headache, neck pain, neck stiffness, chills, sore throat Negative for photophobia, vision changes, fevers, chest pain, shortness of breath  Physical Exam:   Gen: No distress  Neuro: Awake and Alert  Skin: Warm    Focused Exam: Tender to palpation over the entire neck including over the thyroid.  No goiter noted.  No appreciable cervical lymphadenopathy.  No nuchal rigidity is noted.  PERRLA.  Oropharynx is clear.  No angioedema or edema in the oropharynx.  Managing secretions tolerating airway.  Heart regular rate and rhythm.  Lungs clear to auscultation bilaterally.   Initiation of care has begun. The patient has been counseled on the process, plan, and necessity for staying for the completion/evaluation, and the remainder of the medical screening examination   Discussed with the patient that exiting the department prior to completion of the work-up is AMA and there is no guarantee that there are no emergency medical conditions present.     Doristine Devoid, PA-C 07/03/17 1447    Carmin Muskrat, MD 07/03/17 2012

## 2017-07-03 NOTE — Discharge Instructions (Addendum)
Please read attached information. If you experience any new or worsening signs or symptoms please return to the emergency room for evaluation. Please follow-up with your primary care provider or specialist as discussed. Please use medication prescribed only as directed and discontinue taking if you have any concerning signs or symptoms.   °
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
## 2017-07-08 ENCOUNTER — Telehealth: Payer: Self-pay | Admitting: Gastroenterology

## 2017-07-08 ENCOUNTER — Other Ambulatory Visit: Payer: Self-pay | Admitting: Gastroenterology

## 2017-07-08 NOTE — Telephone Encounter (Signed)
Pt called to inform that she has requested her pharmacy a rf for reglan because she is still having the same sxs. She has an appt with Dr. Loletha Carrow on 07/16/17.

## 2017-07-09 NOTE — Telephone Encounter (Signed)
I have refilled it for one more week until follow up.  Keeping dose the same because risk of side effects.

## 2017-07-09 NOTE — Telephone Encounter (Signed)
Pt. has a follow up scheduled for 07-16-2017. Message from patient indicated she is still having the same symptoms.

## 2017-07-10 NOTE — Telephone Encounter (Signed)
Left a voicemail to inform the patient.

## 2017-07-16 ENCOUNTER — Telehealth: Payer: Self-pay | Admitting: Gastroenterology

## 2017-07-16 ENCOUNTER — Ambulatory Visit: Payer: BLUE CROSS/BLUE SHIELD | Admitting: Gastroenterology

## 2017-07-16 NOTE — Progress Notes (Deleted)
[facility] Progress Note  Chief Complaint: Nausea and vomiting  Subjective  History:  This is a 52 year old woman following up after upper endoscopy for recurrent episodes of acute onset nausea and vomiting.  It has been occurring for about a year.  She has complex coronary disease with prior interventions.  Upper endoscopy was normal, and she could not tolerate the bowel preparation for a colonoscopy, which was planned because she also complained of diarrhea with these episodes.  With a normal upper endoscopy, and was frustrated by the lack of answers.  She was also upset that I suggested she stop using marijuana daily, as this could potentially be a confounding factor by causing cannabis hyperemesis.  She characterized that as a "lame ass excuse".  It remains my advice nonetheless.  When contacted by my nurse with some results and a plan, she was still frustrated and unsatisfied, and hung up on my nurse at the end of conversation.   Rhonda Brown has a known history of gallstones on imaging during initial work-up for this last year.  It was unclear if that was the cause of symptoms, so I scheduled a gastric emptying study because she reported a family history of similar symptoms that were apparently treated with metoclopramide.  Gastric emptying study showed a mild delayed gastric emptying after 3 hours.  With that result, and after consulting with her cardiologist about the cardiac risk of metoclopramide, she underwent EKG to check a baseline QTC.  I put her on a trial of metoclopramide 5 mg 3 times daily, and follow-up EKG a week later showed stable QTC.  She has been on that regimen for a few weeks.  ROS: Cardiovascular:  no chest pain Respiratory: no dyspnea  The patient's Past Medical, Family and Social History were reviewed and are on file in the EMR.  Objective:  Med list reviewed  Current Outpatient Medications:  .  aspirin 81 MG chewable tablet, Chew 81 mg by mouth daily., Disp: ,  Rfl:  .  atorvastatin (LIPITOR) 80 MG tablet, TAKE 1 TABLET BY MOUTH EVERYDAY AT BEDTIME, Disp: 30 tablet, Rfl: 3 .  clopidogrel (PLAVIX) 75 MG tablet, TAKE 1 TABLET BY MOUTH EVERY DAY, Disp: 90 tablet, Rfl: 0 .  diclofenac sodium (VOLTAREN) 1 % GEL, Apply 1 application topically daily as needed (for pain). , Disp: , Rfl:  .  diphenhydrAMINE (BENADRYL) 25 MG tablet, Take 25 mg by mouth daily as needed for allergies., Disp: , Rfl:  .  hyoscyamine (LEVSIN SL) 0.125 MG SL tablet, Place 1 tablet (0.125 mg total) under the tongue every 4 (four) hours as needed. (Patient not taking: Reported on 06/11/2017), Disp: 30 tablet, Rfl: 0 .  ibuprofen (ADVIL,MOTRIN) 200 MG tablet, Take 800 mg by mouth 4 (four) times daily as needed for moderate pain. , Disp: , Rfl:  .  isosorbide mononitrate (IMDUR) 30 MG 24 hr tablet, Take 1 tablet (30 mg total) by mouth daily., Disp: 30 tablet, Rfl: 9 .  methocarbamol (ROBAXIN) 500 MG tablet, Take 1 tablet (500 mg total) by mouth 2 (two) times daily., Disp: 20 tablet, Rfl: 0 .  metoCLOPramide (REGLAN) 5 MG tablet, Take 1 tablet (5 mg total) by mouth every 8 (eight) hours as needed for nausea. Take 1 tab before starting prep, 1 tab before bedtime and 1 tab before morning prep., Disp: 3 tablet, Rfl: 0 .  metoCLOPramide (REGLAN) 5 MG tablet, TAKE 1 TABLET (5 MG TOTAL) BY MOUTH 3 (THREE) TIMES DAILY BEFORE  MEALS., Disp: 21 tablet, Rfl: 0 .  metoprolol tartrate (LOPRESSOR) 25 MG tablet, Take 1 tablet (25 mg total) by mouth 2 (two) times daily., Disp: 60 tablet, Rfl: 10 .  Na Sulfate-K Sulfate-Mg Sulf (SUPREP BOWEL PREP KIT) 17.5-3.13-1.6 GM/177ML SOLN, Take 1 kit by mouth as directed., Disp: 324 mL, Rfl: 0 .  nicotine (NICODERM CQ - DOSED IN MG/24 HOURS) 14 mg/24hr patch, Place 14 mg onto the skin daily., Disp: , Rfl:  .  nitroGLYCERIN (NITROSTAT) 0.4 MG SL tablet, Place 0.4 mg under the tongue every 5 (five) minutes as needed for chest pain., Disp: , Rfl:  .  omeprazole (PRILOSEC) 40  MG capsule, Take 1 capsule (40 mg total) by mouth 2 (two) times daily. (Patient not taking: Reported on 05/27/2017), Disp: 60 capsule, Rfl: 5 .  ondansetron (ZOFRAN) 8 MG tablet, Take 1 tablet (8 mg total) by mouth every 8 (eight) hours as needed for nausea or vomiting., Disp: 45 tablet, Rfl: 1 .  prasugrel (EFFIENT) 10 MG TABS tablet, Take 10 mg by mouth daily., Disp: , Rfl:  .  ranitidine (ZANTAC) 150 MG capsule, Take 150 mg by mouth at bedtime., Disp: , Rfl:  .  ranolazine (RANEXA) 500 MG 12 hr tablet, Take 500 mg by mouth 2 (two) times daily., Disp: , Rfl:  .  sucralfate (CARAFATE) 1 g tablet, Take 1 tablet (1 g total) by mouth 4 (four) times daily -  with meals and at bedtime. (Patient not taking: Reported on 05/27/2017), Disp: 30 tablet, Rfl: 0 .  traMADol (ULTRAM) 50 MG tablet, Take 50 mg by mouth every 6 (six) hours as needed for severe pain., Disp: , Rfl:    Vital signs in last 24 hrs: There were no vitals filed for this visit.  Physical Exam  ***  HEENT: sclera anicteric, oral mucosa moist without lesions  Neck: supple, no thyromegaly, JVD or lymphadenopathy  Cardiac: RRR without murmurs, S1S2 heard, no peripheral edema  Pulm: clear to auscultation bilaterally, normal RR and effort noted  Abdomen: soft, *** tenderness, with active bowel sounds. No guarding or palpable hepatosplenomegaly.  Skin; warm and dry, no jaundice or rash  Recent Labs:  Small bowel biopsies normal  Radiologic studies:  GES as noted above.  _0 @ Assessment: No diagnosis found.    Plan:    Total time *** minutes, over half spent face-to-face with patient in counseling and coordination of care.   Nelida Meuse III

## 2017-07-16 NOTE — Telephone Encounter (Signed)
Understood 

## 2017-07-24 DIAGNOSIS — K3184 Gastroparesis: Secondary | ICD-10-CM | POA: Diagnosis not present

## 2017-08-01 ENCOUNTER — Ambulatory Visit (INDEPENDENT_AMBULATORY_CARE_PROVIDER_SITE_OTHER): Payer: BLUE CROSS/BLUE SHIELD | Admitting: Physician Assistant

## 2017-08-01 ENCOUNTER — Other Ambulatory Visit: Payer: Self-pay

## 2017-08-01 ENCOUNTER — Encounter: Payer: Self-pay | Admitting: Physician Assistant

## 2017-08-01 VITALS — BP 96/68 | HR 60 | Temp 97.7°F | Resp 16 | Ht 61.38 in | Wt 183.8 lb

## 2017-08-01 DIAGNOSIS — F17210 Nicotine dependence, cigarettes, uncomplicated: Secondary | ICD-10-CM

## 2017-08-01 DIAGNOSIS — B9689 Other specified bacterial agents as the cause of diseases classified elsewhere: Secondary | ICD-10-CM

## 2017-08-01 DIAGNOSIS — J019 Acute sinusitis, unspecified: Secondary | ICD-10-CM

## 2017-08-01 MED ORDER — CETIRIZINE HCL 10 MG PO CAPS: 5.0000 mg | ORAL_CAPSULE | Freq: Every day | ORAL | 0 refills | Status: DC

## 2017-08-01 MED ORDER — DOXYCYCLINE HYCLATE 100 MG PO CAPS: 100.0000 mg | ORAL_CAPSULE | Freq: Two times a day (BID) | ORAL | 0 refills | Status: AC

## 2017-08-01 NOTE — Patient Instructions (Addendum)
Given the ear infection and your symptoms and exam is most likely an infection in your sinuses driving your headache.  We will try low risk antibiotic to see if that makes you feel better.  Given the history with your heart I would be cautious with using ibuprofen, Aleve, Goody's powder.  These medications can tend to increase blood pressure in place strain on the kidneys.  Tylenol 1000 mg every 8 hours it is safer.  Please follow the advice of your specialist closely.  I will see her back in a month for an annual physical.  It was nice meeting you today.    IF you received an x-ray today, you will receive an invoice from [facility]. Please contact [facility] at [phone] with questions or concerns regarding your invoice.   IF you received labwork today, you will receive an invoice from [facility]. Please contact [facility] at [phone] with questions or concerns regarding your invoice.   Our billing staff will not be able to assist you with questions regarding bills from these companies.  You will be contacted with the lab results as soon as they are available. The fastest way to get your results is to activate your My Chart account. Instructions are located on the last page of this paperwork. If you have not heard from [place] regarding the results in 2 weeks, please contact this office.

## 2017-08-01 NOTE — Progress Notes (Signed)
08/01/2017 2:05 PM   DOB: Mar 27, 1965 / MRN: [id number]  SUBJECTIVE:  Rhonda Brown is a 52 y.o. female presenting for frontal sinus headache that she describes as a pound.  Has a history of migraines but tells me that this is not like that because she typically cannot leave the house and has to be in a dark room.  Seen in the ED about a month ago for sore throat.  She has mild cough complains of left ear pain is well today.  Denies weakness in her extremities, gait disturbance, difficulty with speech, vision changes.  Denies fever, teeth pain.  Has a history of CAD and is followed by cardiology.  She continues to smoke however used to smoke up to 2 packs a day and now is down to a pack a week.  She continues to struggle with smoking and knows that she needs to quit and continues to try to reduce her cigarette consumption.  She does wear patches and tells me she does not need any more of these today.  Sees Dr. Loletha Carrow in GI.  Here to establish care and tells me that she needs a physical.  She wants a Pap smear.  She is allergic to other.   She  has a past medical history of Anginal pain ([place]), CAD (coronary artery disease), Headache, History of blood transfusion (12/1987), History of kidney stones, Hyperlipidemia, Migraine, NSTEMI (non-ST elevated myocardial infarction) ([place]) (02/13/2016), and Tobacco abuse.    She  reports that she has been smoking cigarettes.  She has never used smokeless tobacco. She reports that she drinks alcohol. She reports that she has current or past drug history. Drug: Marijuana. She  reports that she currently engages in sexual activity. She reports using the following method of birth control/protection: Surgical. The patient  has a past surgical history that includes Tonsillectomy; Appendectomy; Inner ear surgery (Left, 1990s); Coronary angioplasty with stent (02/12/2016); Dilation and curettage of uterus; Ectopic pregnancy surgery; Tubal ligation; Cardiac  catheterization (N/A, 02/27/2016); and Esophagogastroduodenoscopy (egd) with propofol (N/A, 06/03/2017).  Her family history includes Diabetes Mellitus II in her mother and sister; Heart attack (age of onset: 26) in her father, paternal uncle, and paternal uncle; Renal cancer in her father; Stroke in her mother and sister.  Review of Systems  Constitutional: Negative for chills, diaphoresis and fever.  Eyes: Negative.   Respiratory: Negative for cough, hemoptysis, sputum production, shortness of breath and wheezing.   Cardiovascular: Negative for chest pain, orthopnea and leg swelling.  Gastrointestinal: Negative for nausea.  Musculoskeletal: Negative for myalgias.  Skin: Negative for rash.  Neurological: Positive for headaches. Negative for dizziness, tingling, tremors, sensory change, speech change, focal weakness, seizures, loss of consciousness and weakness.    The problem list and medications were reviewed and updated by myself where necessary and exist elsewhere in the encounter.   OBJECTIVE:  BP 96/68   Pulse 60   Temp 97.7 F (36.5 C)   Resp 16   Ht 5' 1.38" (1.559 m)   Wt 183 lb 12.8 oz (83.4 kg)   SpO2 97%   BMI 34.30 kg/m   Physical Exam  Constitutional: She is oriented to person, place, and time. She appears well-nourished. No distress.  HENT:  Ears:  Eyes: Pupils are equal, round, and reactive to light. EOM are normal.  Cardiovascular: Normal rate, regular rhythm, S1 normal, S2 normal, normal heart sounds and intact distal pulses. Exam reveals no gallop, no friction rub and no decreased pulses.  No murmur heard. Pulmonary/Chest: Effort normal. No stridor. No respiratory distress. She has no wheezes. She has no rales.  Abdominal: She exhibits no distension.  Musculoskeletal: She exhibits no edema.  Neurological: She is alert and oriented to person, place, and time. She has normal strength. She is not disoriented. She displays no atrophy and no tremor. No cranial nerve  deficit or sensory deficit. She exhibits normal muscle tone. She displays a negative Romberg sign. She displays no seizure activity. Coordination and gait normal. GCS eye subscore is 4. GCS verbal subscore is 5. GCS motor subscore is 6.  Skin: Skin is dry. She is not diaphoretic.  Psychiatric: She has a normal mood and affect.  Vitals reviewed.   Lab Results  Component Value Date   WBC 12.8 (H) 07/03/2017   HGB 15.7 (H) 07/03/2017   HCT 46.3 (H) 07/03/2017   MCV 82.5 07/03/2017   PLT 204 07/03/2017    Lab Results  Component Value Date   CREATININE 0.79 07/03/2017   BUN 6 07/03/2017   NA 142 07/03/2017   K 3.6 07/03/2017   CL 111 07/03/2017   CO2 21 (L) 07/03/2017    Lab Results  Component Value Date   ALT 28 07/03/2017   AST 24 07/03/2017   ALKPHOS 117 07/03/2017   BILITOT 0.8 07/03/2017    Lab Results  Component Value Date   TSH 1.093 07/03/2017    Lab Results  Component Value Date   HGBA1C 5.8 (H) 02/26/2016    Lab Results  Component Value Date   CHOL 179 04/14/2017   HDL 24 (L) 04/14/2017   LDLCALC 102 (H) 04/14/2017   TRIG 265 (H) 04/14/2017   CHOLHDL 7.5 (H) 04/14/2017     No results found for this or any previous visit (from the past 72 hour(s)).  No results found.  ASSESSMENT AND PLAN:  Tzipora was seen today for establish care.  Diagnoses and all orders for this visit:  Acute bacterial rhinosinusitis  Cigarette smoker  Other orders -     doxycycline (VIBRAMYCIN) 100 MG capsule; Take 1 capsule (100 mg total) by mouth 2 (two) times daily for 10 days. -     Cetirizine HCl (ZYRTEC ALLERGY) 10 MG CAPS; Take 0.5-1 capsules (5-10 mg total) by mouth daily.    The patient is advised to call or return to clinic if she does not see an improvement in symptoms, or to seek the care of the closest emergency department if she worsens with the above plan.   Philis Fendt, MHS, PA-C [facility] 08/01/2017 2:05  PM

## 2017-08-13 ENCOUNTER — Other Ambulatory Visit: Payer: Self-pay | Admitting: Physician Assistant

## 2017-08-26 DIAGNOSIS — K3184 Gastroparesis: Secondary | ICD-10-CM | POA: Diagnosis not present

## 2017-08-26 DIAGNOSIS — K589 Irritable bowel syndrome without diarrhea: Secondary | ICD-10-CM | POA: Diagnosis not present

## 2017-08-26 DIAGNOSIS — K8020 Calculus of gallbladder without cholecystitis without obstruction: Secondary | ICD-10-CM | POA: Diagnosis not present

## 2017-08-29 ENCOUNTER — Other Ambulatory Visit: Payer: Self-pay | Admitting: Physician Assistant

## 2017-08-29 ENCOUNTER — Encounter: Payer: BLUE CROSS/BLUE SHIELD | Admitting: Physician Assistant

## 2017-09-03 ENCOUNTER — Ambulatory Visit (INDEPENDENT_AMBULATORY_CARE_PROVIDER_SITE_OTHER): Payer: BLUE CROSS/BLUE SHIELD | Admitting: Physician Assistant

## 2017-09-03 ENCOUNTER — Encounter: Payer: Self-pay | Admitting: Physician Assistant

## 2017-09-03 ENCOUNTER — Other Ambulatory Visit: Payer: Self-pay

## 2017-09-03 VITALS — BP 102/74 | HR 76 | Temp 97.6°F | Resp 16 | Ht 61.38 in | Wt 183.0 lb

## 2017-09-03 DIAGNOSIS — Z1321 Encounter for screening for nutritional disorder: Secondary | ICD-10-CM

## 2017-09-03 DIAGNOSIS — Z13220 Encounter for screening for lipoid disorders: Secondary | ICD-10-CM | POA: Diagnosis not present

## 2017-09-03 DIAGNOSIS — Z01419 Encounter for gynecological examination (general) (routine) without abnormal findings: Secondary | ICD-10-CM | POA: Diagnosis not present

## 2017-09-03 DIAGNOSIS — Z130 Encounter for screening for diseases of the blood and blood-forming organs and certain disorders involving the immune mechanism: Secondary | ICD-10-CM | POA: Diagnosis not present

## 2017-09-03 DIAGNOSIS — Z23 Encounter for immunization: Secondary | ICD-10-CM

## 2017-09-03 DIAGNOSIS — Z124 Encounter for screening for malignant neoplasm of cervix: Secondary | ICD-10-CM

## 2017-09-03 DIAGNOSIS — Z126 Encounter for screening for malignant neoplasm of bladder: Secondary | ICD-10-CM | POA: Diagnosis not present

## 2017-09-03 DIAGNOSIS — Z0000 Encounter for general adult medical examination without abnormal findings: Secondary | ICD-10-CM

## 2017-09-03 DIAGNOSIS — Z131 Encounter for screening for diabetes mellitus: Secondary | ICD-10-CM | POA: Diagnosis not present

## 2017-09-03 DIAGNOSIS — Z13228 Encounter for screening for other metabolic disorders: Secondary | ICD-10-CM

## 2017-09-03 DIAGNOSIS — Z1329 Encounter for screening for other suspected endocrine disorder: Secondary | ICD-10-CM | POA: Diagnosis not present

## 2017-09-03 NOTE — Progress Notes (Signed)
09/03/2017 8:38 AM   DOB: 1965/11/14 / MRN: [id number]  SUBJECTIVE:  Rhonda Brown is a 52 y.o. female presenting for an annual physical and has no complaints today. No formal exercise. Last pap normal per her.  Last breast screening two years ago.  She feels well today.    Last TDAP unknown.   Immunization History  Administered Date(s) Administered  . Pneumococcal Polysaccharide-23 07/17/2016    Health Maintenance Due  Topic  . HIV Screening   . TETANUS/TDAP   . PAP SMEAR     She is allergic to other.   She  has a past medical history of Anginal pain ([place]), CAD (coronary artery disease), Headache, History of blood transfusion (12/1987), History of kidney stones, Hyperlipidemia, Migraine, NSTEMI (non-ST elevated myocardial infarction) ([place]) (02/13/2016), and Tobacco abuse.    She  reports that she has been smoking cigarettes.  She has never used smokeless tobacco. She reports that she drinks alcohol. She reports that she has current or past drug history. Drug: Marijuana. She  reports that she currently engages in sexual activity. She reports using the following method of birth control/protection: Surgical. The patient  has a past surgical history that includes Tonsillectomy; Appendectomy; Inner ear surgery (Left, 1990s); Coronary angioplasty with stent (02/12/2016); Dilation and curettage of uterus; Ectopic pregnancy surgery; Tubal ligation; Cardiac catheterization (N/A, 02/27/2016); and Esophagogastroduodenoscopy (egd) with propofol (N/A, 06/03/2017).  Her family history includes Diabetes Mellitus II in her mother and sister; Heart attack (age of onset: 34) in her father, paternal uncle, and paternal uncle; Renal cancer in her father; Stroke in her mother and sister.  Review of Systems  Constitutional: Negative for chills, diaphoresis and fever.  Eyes: Negative.   Respiratory: Negative for cough, hemoptysis, sputum production, shortness of breath and wheezing.   Cardiovascular:  Negative for chest pain, orthopnea and leg swelling.  Gastrointestinal: Negative for abdominal pain, blood in stool, constipation, diarrhea, heartburn, melena, nausea and vomiting.  Genitourinary: Negative for dysuria, flank pain, frequency, hematuria and urgency.  Skin: Negative for rash.  Neurological: Negative for dizziness, sensory change, speech change, focal weakness and headaches.    Problem list and medications reviewed and updated by myself where necessary, and exist elsewhere in the encounter.   OBJECTIVE:  BP 102/74   Pulse 76   Temp 97.6 F (36.4 C)   Resp 16   Ht 5' 1.38" (1.559 m)   Wt 183 lb (83 kg)   SpO2 93%   BMI 34.15 kg/m   Physical Exam  Constitutional: She is oriented to person, place, and time. She appears well-nourished.  Non-toxic appearance. No distress.  Eyes: Pupils are equal, round, and reactive to light. EOM are normal.  Cardiovascular: Normal rate, regular rhythm, S1 normal, S2 normal, normal heart sounds and intact distal pulses. Exam reveals no gallop, no friction rub and no decreased pulses.  No murmur heard. Pulmonary/Chest: Effort normal. No stridor. No respiratory distress. She has no wheezes. She has no rales.  Abdominal: Soft. Normal appearance and bowel sounds are normal. She exhibits no distension and no mass. There is no tenderness. There is no rigidity, no rebound, no guarding and no CVA tenderness.  Genitourinary: Vagina normal and uterus normal.    There is no rash, tenderness or lesion on the right labia. There is no rash, tenderness or lesion on the left labia. Uterus is not enlarged and not tender. Cervix exhibits no motion tenderness, no discharge and no friability. Right adnexum displays no mass, no tenderness and  no fullness. Left adnexum displays no mass, no tenderness and no fullness.  Musculoskeletal: She exhibits no edema.  Neurological: She is alert and oriented to person, place, and time. She has normal strength and normal  reflexes. She is not disoriented. She displays no atrophy. No cranial nerve deficit or sensory deficit. She exhibits normal muscle tone. Coordination and gait normal.  Skin: Skin is warm and dry. She is not diaphoretic. No pallor.  Psychiatric: She has a normal mood and affect. Her behavior is normal.  Vitals reviewed.   Lab Results  Component Value Date   WBC 12.8 (H) 07/03/2017   HGB 15.7 (H) 07/03/2017   HCT 46.3 (H) 07/03/2017   MCV 82.5 07/03/2017   PLT 204 07/03/2017    Lab Results  Component Value Date   NA 142 07/03/2017   K 3.6 07/03/2017   CL 111 07/03/2017   CO2 21 (L) 07/03/2017    Lab Results  Component Value Date   CREATININE 0.79 07/03/2017    Lab Results  Component Value Date   ALT 28 07/03/2017   AST 24 07/03/2017   ALKPHOS 117 07/03/2017   BILITOT 0.8 07/03/2017    Lab Results  Component Value Date   TSH 1.093 07/03/2017    Lab Results  Component Value Date   HGBA1C 5.8 (H) 02/26/2016    ASSESSMENT AND PLAN  Rhonda Brown was seen today for annual exam.  Diagnoses and all orders for this visit:  Annual physical exam  Screening for endocrine, nutritional, metabolic and immunity disorder -     CBC -     Basic metabolic panel -     Hepatic function panel -     Iron, TIBC and Ferritin Panel  Screening for lipid disorders -     Lipid panel  Screening for thyroid disorder -     TSH  Screening for diabetes mellitus -     Hemoglobin A1c  Pap smear for cervical cancer screening -     Pap IG, CT/NG w/ reflex HPV when ASC-U  Screening for bladder cancer -     Urinalysis, dipstick only  Cervical cancer screening -     MM DIGITAL SCREENING BILATERAL; Future  Need for diphtheria-tetanus-pertussis (Tdap) vaccine -     Tdap vaccine greater than or equal to 7yo IM    The patient was advised to call or return to clinic if she does not see an improvement in symptoms or to seek the care of the closest emergency department if she worsens with  the above plan.   Philis Fendt, MHS, PA-C [facility] 09/03/2017 8:38 AM

## 2017-09-03 NOTE — Patient Instructions (Addendum)
  Please keep all of your appointments with your specialist.  Please continue all of your medications.  Please go for your mammogram.  If you do not hear anything from scheduling or the imaging facility within 2 weeks then please call so we can find out why this is not happened.  I will release your results to my chart with comments  Exercise improves every system in the body.  It lowers the risk of heart disease, decreases blood pressure, reduces the symptoms of depression and anxiety, and lowers blood sugar. To receive these benefits, try to get 150 minutes of planned exercise each week.  You can break this 150 minutes up however you like.  For instance, you can perform 30 minutes of brisk walking 5 days a week, or perform 50 minutes 3 days a week.  If you don't like walking, or can't find a safe place to walk, find another way to move that you can enjoy.  Exercise tapes, cycling, stair climbing, swimming, or a combination will be just as good as a walking program. To ensure the proper intensity, you can use the talk test. Essentially, you should be able to carry on a conversation, but you should have to take short breaks from the conversation in order catch your breath.      IF you received an x-ray today, you will receive an invoice from [facility]. Please contact [facility] at [phone] with questions or concerns regarding your invoice.   IF you received labwork today, you will receive an invoice from [facility]. Please contact [facility] at [phone] with questions or concerns regarding your invoice.   Our billing staff will not be able to assist you with questions regarding bills from these companies.  You will be contacted with the lab results as soon as they are available. The fastest way to get your results is to activate your My Chart account. Instructions are located on the last page of this paperwork. If you have not heard from [place] regarding the results in 2 weeks,  please contact this office.

## 2017-09-04 LAB — CBC
HEMATOCRIT: 42.8 % (ref 34.0–46.6)
HEMOGLOBIN: 14.2 g/dL (ref 11.1–15.9)
MCH: 27.2 pg (ref 26.6–33.0)
MCHC: 33.2 g/dL (ref 31.5–35.7)
MCV: 82 fL (ref 79–97)
Platelets: 208 10*3/uL (ref 150–450)
RBC: 5.22 x10E6/uL (ref 3.77–5.28)
RDW: 16.1 % — ABNORMAL HIGH (ref 12.3–15.4)
WBC: 8.9 10*3/uL (ref 3.4–10.8)

## 2017-09-04 LAB — URINALYSIS, DIPSTICK ONLY
Bilirubin, UA: NEGATIVE
Glucose, UA: NEGATIVE
Ketones, UA: NEGATIVE
LEUKOCYTES UA: NEGATIVE
NITRITE UA: NEGATIVE
Protein, UA: NEGATIVE
RBC UA: NEGATIVE
SPEC GRAV UA: 1.011 (ref 1.005–1.030)
Urobilinogen, Ur: 0.2 mg/dL (ref 0.2–1.0)
pH, UA: 6.5 (ref 5.0–7.5)

## 2017-09-04 LAB — HEMOGLOBIN A1C
ESTIMATED AVERAGE GLUCOSE: 134 mg/dL
Hgb A1c MFr Bld: 6.3 % — ABNORMAL HIGH (ref 4.8–5.6)

## 2017-09-04 LAB — BASIC METABOLIC PANEL
BUN/Creatinine Ratio: 9 (ref 9–23)
BUN: 8 mg/dL (ref 6–24)
CALCIUM: 9.3 mg/dL (ref 8.7–10.2)
CO2: 18 mmol/L — AB (ref 20–29)
Chloride: 107 mmol/L — ABNORMAL HIGH (ref 96–106)
Creatinine, Ser: 0.91 mg/dL (ref 0.57–1.00)
GFR calc non Af Amer: 74 mL/min/{1.73_m2} (ref >59)
GFR, EST AFRICAN AMERICAN: 86 mL/min/{1.73_m2} (ref >59)
Glucose: 111 mg/dL — ABNORMAL HIGH (ref 65–99)
Potassium: 4.1 mmol/L (ref 3.5–5.2)
Sodium: 144 mmol/L (ref 134–144)

## 2017-09-04 LAB — HEPATIC FUNCTION PANEL
ALBUMIN: 4.2 g/dL (ref 3.5–5.5)
ALT: 15 IU/L (ref 0–32)
AST: 17 IU/L (ref 0–40)
Alkaline Phosphatase: 118 IU/L — ABNORMAL HIGH (ref 39–117)
BILIRUBIN TOTAL: 0.5 mg/dL (ref 0.0–1.2)
BILIRUBIN, DIRECT: 0.11 mg/dL (ref 0.00–0.40)
Total Protein: 7.1 g/dL (ref 6.0–8.5)

## 2017-09-04 LAB — TSH: TSH: 2.49 u[IU]/mL (ref 0.450–4.500)

## 2017-09-04 LAB — IRON,TIBC AND FERRITIN PANEL
Ferritin: 79 ng/mL (ref 15–150)
IRON: 38 ug/dL (ref 27–159)
Iron Saturation: 12 % — ABNORMAL LOW (ref 15–55)
Total Iron Binding Capacity: 316 ug/dL (ref 250–450)
UIBC: 278 ug/dL (ref 131–425)

## 2017-09-04 LAB — LIPID PANEL
CHOL/HDL RATIO: 8 ratio — AB (ref 0.0–4.4)
Cholesterol, Total: 185 mg/dL (ref 100–199)
HDL: 23 mg/dL — ABNORMAL LOW (ref >39)
LDL CALC: 114 mg/dL — AB (ref 0–99)
Triglycerides: 242 mg/dL — ABNORMAL HIGH (ref 0–149)
VLDL Cholesterol Cal: 48 mg/dL — ABNORMAL HIGH (ref 5–40)

## 2017-09-04 LAB — VITAMIN D 25 HYDROXY (VIT D DEFICIENCY, FRACTURES): Vit D, 25-Hydroxy: 10.2 ng/mL — ABNORMAL LOW (ref 30.0–100.0)

## 2017-09-05 LAB — PAP IG, CT-NG, RFX HPV ASCU
CHLAMYDIA, NUC. ACID AMP: NEGATIVE
GONOCOCCUS BY NUCLEIC ACID AMP: NEGATIVE
PAP Smear Comment: 0

## 2017-09-15 ENCOUNTER — Ambulatory Visit: Payer: Self-pay | Admitting: General Surgery

## 2017-09-15 ENCOUNTER — Telehealth: Payer: Self-pay

## 2017-09-15 DIAGNOSIS — K8020 Calculus of gallbladder without cholecystitis without obstruction: Secondary | ICD-10-CM | POA: Diagnosis not present

## 2017-09-15 NOTE — Telephone Encounter (Signed)
   [facility] Pre-operative Risk Assessment    Request for surgical clearance:  1. What type of surgery is being performed?    - Lap Chole  2. When is this surgery scheduled?     - Pending  3. Are there any medications that need to be held prior to surgery and how long?    - Clopidogrel: please advise  4. Practice name and name of physician performing surgery?     [facility]   - Dr. Ralene Ok  5. What is your office phone and fax number?     - Phone: [phone]   - Fax: [phone] - ATTN: Lindwood Coke, RN  6. Anesthesia type (None, local, MAC, general) ?     Vivi Barrack J 09/15/2017, 1:47 PM  _________________________________________________________________   (provider comments below)

## 2017-09-15 NOTE — H&P (Signed)
History of Present Illness Ralene Ok MD; 09/15/2017 9:25 AM) The patient is a 52 year old female who presents for evaluation of gall stones. Patient is a 52 year old female comes to follow back up for abdominal pain. She states that she continue with abdominal pain in epigastric region. She states is associated with nausea vomiting, as well as diarrhea. 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 Patient states that she still has abdominal pain even with her erythromycin.  We discussed the fact that she still has dark stools. Patient states that she was set up for colonoscopy because not tolerate the prep. --------------------------- The patient is a 52 year old female who is referred by Ellouise Newer, PA-C for evaluation of cholelithiasis. Patient was recently ER secondary to generalized abdominal pain. She states this is similar to her previous episode 5-10 years ago. She states that she was told that time this was likely due to "gastric paralysis. " Patient states she had generalized abdominal pain that this episode associated with nausea vomiting diarrhea. Patient states that the pain started a week or so prior to being seen in the ER and lasted for a week thereafter. She states she ultrasound as well as CT scan which revealed cholelithiasis and no signs of cholecystitis.  Patient states she underwent Hemoccult test which was negative however that I cannot find this in Epic. She states her stools were black.   Patient states that she was to undergo an endoscopy however secondary to her Effient this is currently not scheduled yet.   Allergies Sabino Gasser; 09/15/2017 9:13 AM) No Known Allergies [06/11/2016]: Allergies Reconciled   Medication History Sabino Gasser; 09/15/2017 9:13 AM) TraMADol HCl (50MG  Tablet, Oral) Active. Atorvastatin Calcium (80MG  Tablet, Oral) Active. Hyoscyamine Sulfate (0.125MG  Tab Sublingual, Sublingual)  Active. Isosorbide Mononitrate ER (30MG  Tablet ER 24HR, Oral) Active. Metoprolol Tartrate (25MG  Tablet, Oral) Active. Nitroglycerin (0.4MG  Tab Sublingual, Sublingual) Active. Pantoprazole Sodium (40MG  Tablet DR, Oral) Active. Prasugrel HCl (10MG  Tablet, Oral) Active. RaNITidine HCl (150MG  Tablet, Oral) Active. Sucralfate (1GM Tablet, Oral) Active. Ibuprofen (200MG  Tablet, Oral) Active. Medications Reconciled    Review of Systems Ralene Ok, MD; 09/15/2017 9:27 AM) General Not Present- Appetite Loss, Chills, Fatigue, Fever, Night Sweats, Weight Gain and Weight Loss. Skin Not Present- Change in Wart/Mole, Dryness, Hives, Jaundice, New Lesions, Non-Healing Wounds, Rash and Ulcer. HEENT Not Present- Earache, Hearing Loss, Hoarseness, Nose Bleed, Oral Ulcers, Ringing in the Ears, Seasonal Allergies, Sinus Pain, Sore Throat, Visual Disturbances, Wears glasses/contact lenses and Yellow Eyes. Respiratory Not Present- Cough and Difficulty Breathing. Breast Not Present- Breast Mass, Breast Pain, Nipple Discharge and Skin Changes. Cardiovascular Not Present- Chest Pain, Difficulty Breathing Lying Down, Leg Cramps, Palpitations, Rapid Heart Rate, Shortness of Breath and Swelling of Extremities. Gastrointestinal Present- Abdominal Pain, Bloating, Chronic diarrhea, Indigestion, Nausea and Vomiting. Not Present- Bloody Stool, Change in Bowel Habits, Constipation, Difficulty Swallowing, Excessive gas, Gets full quickly at meals, Hemorrhoids and Rectal Pain. Female Genitourinary Present- Frequency. Not Present- Nocturia, Painful Urination, Pelvic Pain and Urgency. Musculoskeletal Not Present- Back Pain, Joint Pain, Joint Stiffness, Muscle Pain, Muscle Weakness and Swelling of Extremities. Neurological Not Present- Decreased Memory, Fainting, Headaches, Numbness, Seizures, Tingling, Tremor, Trouble walking and Weakness. Psychiatric Not Present- Anxiety, Bipolar, Change in Sleep Pattern,  Depression, Fearful and Frequent crying. Endocrine Present- Hot flashes. Not Present- Cold Intolerance, Excessive Hunger, Hair Changes, Heat Intolerance and New Diabetes. Hematology Present- Blood Thinners. Not Present- Easy Bruising, Excessive bleeding, Gland problems, HIV and Persistent Infections.  Vitals Levada Dy  Holmes; 09/15/2017 9:14 AM) 09/15/2017 9:13 AM Weight: 182.25 lb Height: 62in Body Surface Area: 1.84 m Body Mass Index: 33.33 kg/m  Temp.: 97.1F(Oral)  Pulse: 93 (Regular)  BP: 132/74 (Sitting, Left Arm, Standard)       Physical Exam Ralene Ok MD; 09/15/2017 9:26 AM) The physical exam findings are as follows: Note:Constitutional: No acute distress, conversant, appears stated age  Eyes: Anicteric sclerae, moist conjunctiva, no lid lag  Neck: No thyromegaly, trachea midline, no cervical lymphadenopathy  Lungs: Clear to auscultation biilaterally, normal respiratory effot  Cardiovascular: regular rate & rhythm, no murmurs, no peripheal edema, pedal pulses 2+  GI: Soft, no masses or hepatosplenomegaly, non-tender to palpation  MSK: Normal gait, no clubbing cyanosis, edema  Skin: No rashes, palpation reveals normal skin turgor  Psychiatric: Appropriate judgment and insight, oriented to person, place, and time    Assessment & Plan Ralene Ok MD; 09/15/2017 9:26 AM) CALCULUS OF GALLBLADDER WITHOUT CHOLECYSTITIS WITHOUT OBSTRUCTION (K80.20) Impression: 52 year old female with likely symptomatic cholelithiasis 1. Patient will require cardiac clearance to be off of her antiplatelet medication prior to surgery. 2. We will schedule her for lap Cholecystectomy 3. Risks and benefits were discussed with the patient to generally include, but not limited to: infection, bleeding, possible need for post op ERCP, damage to the bile ducts, bile leak, and possible need for further surgery. Alternatives were offered and described. All questions were answered  and the patient voiced understanding of the procedure and wishes to proceed at this point with a laparoscopic cholecystectomy

## 2017-09-16 ENCOUNTER — Telehealth: Payer: Self-pay | Admitting: Gastroenterology

## 2017-09-16 NOTE — Telephone Encounter (Signed)
Routed communication to Dr Rosendo Gros as requested.

## 2017-09-16 NOTE — Telephone Encounter (Signed)
I received an office consult note from Dr. Ralene Ok of [facility]. Patient was referred to them by our office for gallstones.  File office note in patient's chart.    However, please fax a message to Dr. Cassandria Santee that this patient elected to transfer her care to another GI practice 2 months ago (see 5/22 telephone call for late cancel).  I do not know what practice she has gone to.  I recommend CCS ask patient to which GI practice they should send a copy of this note.  The note also indicates the patient did not have an EGD.  However, one was done 06/16/17 and report is in [place].  It is the colonoscopy that was not performed since patient could not tolerate prep.

## 2017-09-17 NOTE — Telephone Encounter (Signed)
   Primary Cardiologist: Fransico Him, MD  Chart reviewed as part of pre-operative protocol coverage. Patient was contacted 09/17/2017 in reference to pre-operative risk assessment for pending surgery as outlined below.  Rhonda Brown was last seen on 04/14/2017 by Sharyn Lull lens PA-C.  This was preoperative for an EGD and colonoscopy.  It was okay to hold the Plavix at that time.   Since that day, Rhonda Brown has had a stress test.  Her cardiac condition is unchanged.  The stress test was low risk and her EF was 61%.  Since that time, her cardiac symptoms have not changed.  Therefore, based on ACC/AHA guidelines, the patient would be at acceptable risk for the planned procedure without further cardiovascular testing.   It is okay to hold the Plavix for 5 days prior to the surgery.  Restart as soon as possible after the surgery.  I will route this recommendation to the requesting party via Epic fax function and remove from pre-op pool.  Please call with questions.  Rhonda Ferries, PA-C 09/17/2017, 3:12 PM - [facility]                   - Dr. Ralene Ok  5. What is your office phone and fax number?                     - Phone: [phone]                   - Fax: [phone] - ATTN: Lindwood Coke, RN

## 2017-09-24 ENCOUNTER — Other Ambulatory Visit: Payer: Self-pay

## 2017-09-24 ENCOUNTER — Emergency Department (HOSPITAL_COMMUNITY)
Admission: EM | Admit: 2017-09-24 | Discharge: 2017-09-24 | Disposition: A | Discharge status: Home / Self Care | Payer: BLUE CROSS/BLUE SHIELD | Attending: Emergency Medicine | Admitting: Emergency Medicine

## 2017-09-24 ENCOUNTER — Encounter (HOSPITAL_COMMUNITY): Payer: Self-pay | Admitting: Emergency Medicine

## 2017-09-24 DIAGNOSIS — R0789 Other chest pain: Secondary | ICD-10-CM | POA: Diagnosis not present

## 2017-09-24 DIAGNOSIS — Z5321 Procedure and treatment not carried out due to patient leaving prior to being seen by health care provider: Secondary | ICD-10-CM

## 2017-09-24 DIAGNOSIS — R079 Chest pain, unspecified: Secondary | ICD-10-CM

## 2017-09-24 DIAGNOSIS — M79602 Pain in left arm: Secondary | ICD-10-CM | POA: Diagnosis not present

## 2017-09-24 LAB — BASIC METABOLIC PANEL
Anion gap: 12 (ref 5–15)
BUN: 6 mg/dL (ref 6–20)
CALCIUM: 9.4 mg/dL (ref 8.9–10.3)
CO2: 27 mmol/L (ref 22–32)
Chloride: 103 mmol/L (ref 98–111)
Creatinine, Ser: 0.82 mg/dL (ref 0.44–1.00)
GFR calc Af Amer: >60 mL/min (ref >60)
GFR calc non Af Amer: >60 mL/min (ref >60)
Glucose, Bld: 126 mg/dL — ABNORMAL HIGH (ref 70–99)
Potassium: 3.2 mmol/L — ABNORMAL LOW (ref 3.5–5.1)
Sodium: 142 mmol/L (ref 135–145)

## 2017-09-24 LAB — CBC
HCT: 47 % — ABNORMAL HIGH (ref 36.0–46.0)
Hemoglobin: 14.9 g/dL (ref 12.0–15.0)
MCH: 26.8 pg (ref 26.0–34.0)
MCHC: 31.7 g/dL (ref 30.0–36.0)
MCV: 84.5 fL (ref 78.0–100.0)
Platelets: 216 10*3/uL (ref 150–400)
RBC: 5.56 MIL/uL — ABNORMAL HIGH (ref 3.87–5.11)
RDW: 15.9 % — ABNORMAL HIGH (ref 11.5–15.5)
WBC: 8.5 10*3/uL (ref 4.0–10.5)

## 2017-09-24 LAB — I-STAT TROPONIN, ED: TROPONIN I, POC: 0 ng/mL (ref 0.00–0.08)

## 2017-09-24 MED ORDER — NITROGLYCERIN 0.4 MG SL SUBL: 0.4000 mg | SUBLINGUAL_TABLET | SUBLINGUAL | 2 refills | Status: AC | PRN

## 2017-09-24 NOTE — ED Provider Notes (Signed)
Went in to see pt in order to do Quick Look assessment and pt was actively walking out of the department. I asked Conservation officer, nature where the pt was going and he stated that the pt was upset with having to wait and decided to leave. SHE WAS NOT SEEN BY ME OR ANY OTHER EDP PRIOR TO LEAVING.    [address] [place], [place] 09/24/17 1407    Julianne Rice, MD 09/24/17 1414

## 2017-09-24 NOTE — ED Triage Notes (Signed)
Patient complains of chest pressure, states she feels like she has an elephant sitting on her chest at 0900 today. States she took one of her NTG at home with minimal relief, patient states she believes they may have been expired. Patient also complains of an ache in her left arm. Patient reports feeling very anxious. Patient is alert, oriented, and in no apparent distress at thsi time.

## 2017-09-24 NOTE — ED Notes (Addendum)
Patient asked for NTG tablet. I explained the triage process and when the patient was told she would need to see the provider before she could received NTG, patient abruptly tore the EKG leads from her chest and quickly left he room stating "Fuck this. I'll just go to my fucking heart doctor across the street. I'll fucking die in this place."

## 2017-10-01 ENCOUNTER — Other Ambulatory Visit: Payer: Self-pay | Admitting: Cardiology

## 2017-10-01 ENCOUNTER — Other Ambulatory Visit: Payer: Self-pay | Admitting: Physician Assistant

## 2017-10-06 NOTE — Pre-Procedure Instructions (Signed)
Rhonda Brown  10/06/2017      [id number] [place], [place] [address] [place] [address] Phone: [phone] Fax: [phone]    Your procedure is scheduled on October 14, 2017  Report to [facility] Admitting at 530 AM.  Call this number if you have problems the morning of surgery:  [phone]   Remember:  Do not eat or drink after midnight.    Take these medicines the morning of surgery with A SIP OF WATER  Tylenol-If needed Metoprolol tartrate (lopressor) buproprion (wellbutrin) Cetirizine (zyrtec) Erythromycin  Ondansetron (zofran)-if needed for nausea Tramadol (ultram)-if needed for pain   Follow your surgeon's instructions on when to hold/resume plavix and aspirin.  If no instructions were given call the office to determine how they would like to you take plavix and aspirin  7 days prior to surgery STOP taking any Aspirin (unless otherwise instructed by your surgeon), Aleve, Naproxen, Ibuprofen, Motrin, Advil, Goody's, BC's, all herbal medications, fish oil, and all vitamins   Do not wear jewelry, make-up or nail polish.  Do not wear lotions, powders, or perfumes, or deodorant.  Do not shave 48 hours prior to surgery.    Do not bring valuables to the hospital.  [facility] is not responsible for any belongings or valuables.  Contacts, dentures or bridgework may not be worn into surgery.  Leave your suitcase in the car.  After surgery it may be brought to your room.  For patients admitted to the hospital, discharge time will be determined by your treatment team.  Patients discharged the day of surgery will not be allowed to drive home.    [place]- Preparing For Surgery  Before surgery, you can play an important role. Because skin is not sterile, your skin needs to be as free of germs as possible. You can reduce the number of germs on your skin by washing with  CHG (chlorahexidine gluconate) Soap before surgery.  CHG is an antiseptic cleaner which kills germs and bonds with the skin to continue killing germs even after washing.    Oral Hygiene is also important to reduce your risk of infection.  Remember - BRUSH YOUR TEETH THE MORNING OF SURGERY WITH YOUR REGULAR TOOTHPASTE  Please do not use if you have an allergy to CHG or antibacterial soaps. If your skin becomes reddened/irritated stop using the CHG.  Do not shave (including legs and underarms) for at least 48 hours prior to first CHG shower. It is OK to shave your face.  Please follow these instructions carefully.   1. Shower the NIGHT BEFORE SURGERY and the MORNING OF SURGERY with CHG.   2. If you chose to wash your hair, wash your hair first as usual with your normal shampoo.  3. After you shampoo, rinse your hair and body thoroughly to remove the shampoo.  4. Use CHG as you would any other liquid soap. You can apply CHG directly to the skin and wash gently with a scrungie or a clean washcloth.   5. Apply the CHG Soap to your body ONLY FROM THE NECK DOWN.  Do not use on open wounds or open sores. Avoid contact with your eyes, ears, mouth and genitals (private parts). Wash Face and genitals (private parts)  with your normal soap.  6. Wash thoroughly, paying special attention to the area where your surgery will be performed.  7. Thoroughly rinse your body  with warm water from the neck down.  8. DO NOT shower/wash with your normal soap after using and rinsing off the CHG Soap.  9. Pat yourself dry with a CLEAN TOWEL.  10. Wear CLEAN PAJAMAS to bed the night before surgery, wear comfortable clothes the morning of surgery  11. Place CLEAN SHEETS on your bed the night of your first shower and DO NOT SLEEP WITH PETS.  Day of Surgery:  Do not apply any deodorants/lotions.  Please wear clean clothes to the hospital/surgery center.   Remember to brush your teeth WITH YOUR REGULAR  TOOTHPASTE.  Please read over the following fact sheets that you were given. Pain Booklet, Coughing and Deep Breathing and Surgical Site Infection Prevention

## 2017-10-07 ENCOUNTER — Encounter (HOSPITAL_COMMUNITY): Payer: Self-pay

## 2017-10-07 ENCOUNTER — Other Ambulatory Visit: Payer: Self-pay

## 2017-10-07 ENCOUNTER — Encounter (HOSPITAL_COMMUNITY)
Admission: RE | Admit: 2017-10-07 | Discharge: 2017-10-07 | Disposition: A | Discharge status: Home / Self Care | Payer: BLUE CROSS/BLUE SHIELD | Source: Ambulatory Visit | Attending: General Surgery | Admitting: General Surgery

## 2017-10-07 DIAGNOSIS — Z01818 Encounter for other preprocedural examination: Secondary | ICD-10-CM | POA: Diagnosis not present

## 2017-10-07 DIAGNOSIS — K8080 Other cholelithiasis without obstruction: Secondary | ICD-10-CM | POA: Diagnosis not present

## 2017-10-07 HISTORY — DX: Dyspnea, unspecified: R06.00

## 2017-10-07 HISTORY — DX: Anxiety disorder, unspecified: F41.9

## 2017-10-07 LAB — CBC
HCT: 46.9 % — ABNORMAL HIGH (ref 36.0–46.0)
Hemoglobin: 14.6 g/dL (ref 12.0–15.0)
MCH: 26.7 pg (ref 26.0–34.0)
MCHC: 31.1 g/dL (ref 30.0–36.0)
MCV: 85.9 fL (ref 78.0–100.0)
PLATELETS: 218 10*3/uL (ref 150–400)
RBC: 5.46 MIL/uL — ABNORMAL HIGH (ref 3.87–5.11)
RDW: 16.5 % — AB (ref 11.5–15.5)
WBC: 10.3 10*3/uL (ref 4.0–10.5)

## 2017-10-07 LAB — COMPREHENSIVE METABOLIC PANEL
ALBUMIN: 3.8 g/dL (ref 3.5–5.0)
ALT: 26 U/L (ref 0–44)
AST: 25 U/L (ref 15–41)
Alkaline Phosphatase: 95 U/L (ref 38–126)
Anion gap: 10 (ref 5–15)
BILIRUBIN TOTAL: 1 mg/dL (ref 0.3–1.2)
BUN: 10 mg/dL (ref 6–20)
CHLORIDE: 109 mmol/L (ref 98–111)
CO2: 23 mmol/L (ref 22–32)
CREATININE: 0.9 mg/dL (ref 0.44–1.00)
Calcium: 9.2 mg/dL (ref 8.9–10.3)
GFR calc Af Amer: >60 mL/min (ref >60)
GLUCOSE: 120 mg/dL — AB (ref 70–99)
POTASSIUM: 3.9 mmol/L (ref 3.5–5.1)
Sodium: 142 mmol/L (ref 135–145)
TOTAL PROTEIN: 7.1 g/dL (ref 6.5–8.1)

## 2017-10-07 LAB — HCG, QUANTITATIVE, PREGNANCY: hCG, Beta Chain, Quant, S: 7 m[IU]/mL — ABNORMAL HIGH (ref <5)

## 2017-10-07 LAB — HCG, SERUM, QUALITATIVE: Preg, Serum: POSITIVE — AB
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
## 2017-10-07 NOTE — Progress Notes (Addendum)
PCP: Dr. Philis Fendt @ [facility] Cardiologist: Dr. Gilman Buttner. States she sees [place] simmons,PA  Plavix will be stopped Friday 10/10/17 Pt. To call Dr. Rosendo Gros when to stop aspirin.  Last nitroglycerine 1 week ago. Pt. Had gone to ED, then left ,[facility]. No pain since then.

## 2017-10-10 ENCOUNTER — Other Ambulatory Visit: Payer: Self-pay | Admitting: Physician Assistant

## 2017-10-10 DIAGNOSIS — Z1231 Encounter for screening mammogram for malignant neoplasm of breast: Secondary | ICD-10-CM

## 2017-10-13 NOTE — Anesthesia Preprocedure Evaluation (Addendum)
Anesthesia Evaluation  Patient identified by MRN, date of birth, ID band Patient awake    Reviewed: Allergy & Precautions, NPO status , Patient's Chart, lab work & pertinent test results  Airway Mallampati: II  TM Distance: >3 FB Neck ROM: Full    Dental  (+) Loose,    Pulmonary shortness of breath and with exertion, Current Smoker,    breath sounds clear to auscultation       Cardiovascular + angina + CAD (s/p DES to LAD 2017) and + Past MI (NSTEMI 2017)   Rhythm:Regular Rate:Normal  LHC 02/2016 widely patent LAD stent and mild nonobstructive CAD of the diagonal and mild disease in circumflex.  TTE 2018 EF 55-60%  Lexiscan 04/15/2017 EF 61%, no evidence of ischemia.   Neuro/Psych  Headaches, Anxiety    GI/Hepatic Neg liver ROS, gallstones   Endo/Other  negative endocrine ROS  Renal/GU negative Renal ROS     Musculoskeletal negative musculoskeletal ROS (+)   Abdominal   Peds  Hematology negative hematology ROS (+)   Anesthesia Other Findings Day of surgery medications reviewed with the patient.  Reproductive/Obstetrics                            Anesthesia Physical Anesthesia Plan  ASA: III  Anesthesia Plan: General   Post-op Pain Management:    Induction: Intravenous  PONV Risk Score and Plan: 1 and Ondansetron and Dexamethasone  Airway Management Planned: Oral ETT  Additional Equipment:   Intra-op Plan:   Post-operative Plan: Extubation in OR  Informed Consent: I have reviewed the patients History and Physical, chart, labs and discussed the procedure including the risks, benefits and alternatives for the proposed anesthesia with the patient or authorized representative who has indicated his/her understanding and acceptance.   Dental advisory given  Plan Discussed with: CRNA and Anesthesiologist  Anesthesia Plan Comments:        Anesthesia Quick Evaluation

## 2017-10-24 ENCOUNTER — Encounter (HOSPITAL_COMMUNITY): Payer: Self-pay | Admitting: *Deleted

## 2017-10-24 NOTE — Progress Notes (Signed)
Attempted to call pt numerous times for pre-op call. Unable to reach pt. Left detailed pre-op instructions on pt's voicemail. Pt had a previous PAT for this surgery, but it was rescheduled.

## 2017-10-28 ENCOUNTER — Ambulatory Visit: Payer: Self-pay | Admitting: General Surgery

## 2017-10-28 ENCOUNTER — Encounter (HOSPITAL_COMMUNITY): Payer: Self-pay

## 2017-10-28 ENCOUNTER — Encounter (HOSPITAL_COMMUNITY)
Admission: RE | Disposition: A | Discharge status: Home / Self Care | Payer: Self-pay | Source: Ambulatory Visit | Attending: General Surgery

## 2017-10-28 ENCOUNTER — Ambulatory Visit (HOSPITAL_COMMUNITY): Payer: BLUE CROSS/BLUE SHIELD | Admitting: Physician Assistant

## 2017-10-28 ENCOUNTER — Ambulatory Visit: Payer: BLUE CROSS/BLUE SHIELD | Admitting: Cardiology

## 2017-10-28 ENCOUNTER — Ambulatory Visit (HOSPITAL_COMMUNITY)
Admission: RE | Admit: 2017-10-28 | Discharge: 2017-10-28 | Disposition: A | Discharge status: Home / Self Care | Payer: BLUE CROSS/BLUE SHIELD | Source: Ambulatory Visit | Attending: General Surgery | Admitting: General Surgery

## 2017-10-28 ENCOUNTER — Ambulatory Visit (HOSPITAL_COMMUNITY): Payer: BLUE CROSS/BLUE SHIELD | Admitting: Certified Registered"

## 2017-10-28 DIAGNOSIS — I252 Old myocardial infarction: Secondary | ICD-10-CM | POA: Insufficient documentation

## 2017-10-28 DIAGNOSIS — K8010 Calculus of gallbladder with chronic cholecystitis without obstruction: Secondary | ICD-10-CM | POA: Insufficient documentation

## 2017-10-28 DIAGNOSIS — E876 Hypokalemia: Secondary | ICD-10-CM | POA: Diagnosis not present

## 2017-10-28 DIAGNOSIS — K8020 Calculus of gallbladder without cholecystitis without obstruction: Secondary | ICD-10-CM | POA: Diagnosis not present

## 2017-10-28 DIAGNOSIS — I25119 Atherosclerotic heart disease of native coronary artery with unspecified angina pectoris: Secondary | ICD-10-CM | POA: Diagnosis not present

## 2017-10-28 DIAGNOSIS — F419 Anxiety disorder, unspecified: Secondary | ICD-10-CM | POA: Diagnosis not present

## 2017-10-28 DIAGNOSIS — Z955 Presence of coronary angioplasty implant and graft: Secondary | ICD-10-CM | POA: Diagnosis not present

## 2017-10-28 DIAGNOSIS — F17210 Nicotine dependence, cigarettes, uncomplicated: Secondary | ICD-10-CM | POA: Diagnosis not present

## 2017-10-28 DIAGNOSIS — E785 Hyperlipidemia, unspecified: Secondary | ICD-10-CM | POA: Diagnosis not present

## 2017-10-28 HISTORY — PX: CHOLECYSTECTOMY: SHX55

## 2017-10-28 LAB — COMPREHENSIVE METABOLIC PANEL
ALBUMIN: 3.6 g/dL (ref 3.5–5.0)
ALK PHOS: 108 U/L (ref 38–126)
ALT: 16 U/L (ref 0–44)
AST: 18 U/L (ref 15–41)
Anion gap: 11 (ref 5–15)
BILIRUBIN TOTAL: 0.7 mg/dL (ref 0.3–1.2)
BUN: 6 mg/dL (ref 6–20)
CO2: 21 mmol/L — ABNORMAL LOW (ref 22–32)
CREATININE: 0.91 mg/dL (ref 0.44–1.00)
Calcium: 9 mg/dL (ref 8.9–10.3)
Chloride: 111 mmol/L (ref 98–111)
GFR calc Af Amer: >60 mL/min (ref >60)
GFR calc non Af Amer: >60 mL/min (ref >60)
GLUCOSE: 111 mg/dL — AB (ref 70–99)
Potassium: 3.7 mmol/L (ref 3.5–5.1)
Sodium: 143 mmol/L (ref 135–145)
TOTAL PROTEIN: 7 g/dL (ref 6.5–8.1)

## 2017-10-28 LAB — CBC
HEMATOCRIT: 44.9 % (ref 36.0–46.0)
HEMOGLOBIN: 14.1 g/dL (ref 12.0–15.0)
MCH: 26.7 pg (ref 26.0–34.0)
MCHC: 31.4 g/dL (ref 30.0–36.0)
MCV: 84.9 fL (ref 78.0–100.0)
Platelets: 233 10*3/uL (ref 150–400)
RBC: 5.29 MIL/uL — AB (ref 3.87–5.11)
RDW: 15.9 % — ABNORMAL HIGH (ref 11.5–15.5)
WBC: 10.1 10*3/uL (ref 4.0–10.5)

## 2017-10-28 SURGERY — LAPAROSCOPIC CHOLECYSTECTOMY
Anesthesia: General | Site: Abdomen

## 2017-10-28 MED ORDER — DEXAMETHASONE SODIUM PHOSPHATE 10 MG/ML IJ SOLN
INTRAMUSCULAR | Status: AC
  Filled 2017-10-28: qty 1

## 2017-10-28 MED ORDER — PROPOFOL 10 MG/ML IV BOLUS
INTRAVENOUS | Status: DC | PRN
  Administered 2017-10-28: 130 mg via INTRAVENOUS

## 2017-10-28 MED ORDER — FENTANYL CITRATE (PF) 250 MCG/5ML IJ SOLN
INTRAMUSCULAR | Status: DC | PRN
  Administered 2017-10-28 (×5): 50 ug via INTRAVENOUS

## 2017-10-28 MED ORDER — DEXAMETHASONE SODIUM PHOSPHATE 10 MG/ML IJ SOLN
INTRAMUSCULAR | Status: DC | PRN
  Administered 2017-10-28: 10 mg via INTRAVENOUS

## 2017-10-28 MED ORDER — HYDROMORPHONE HCL 1 MG/ML IJ SOLN
0.2500 mg | INTRAMUSCULAR | Status: DC | PRN
  Administered 2017-10-28 (×2): 0.5 mg via INTRAVENOUS

## 2017-10-28 MED ORDER — ONDANSETRON HCL 4 MG/2ML IJ SOLN
INTRAMUSCULAR | Status: DC | PRN
  Administered 2017-10-28: 4 mg via INTRAVENOUS

## 2017-10-28 MED ORDER — ROCURONIUM BROMIDE 50 MG/5ML IV SOSY
PREFILLED_SYRINGE | INTRAVENOUS | Status: AC
  Filled 2017-10-28: qty 5

## 2017-10-28 MED ORDER — FENTANYL CITRATE (PF) 250 MCG/5ML IJ SOLN
INTRAMUSCULAR | Status: AC
  Filled 2017-10-28: qty 5

## 2017-10-28 MED ORDER — LIDOCAINE 2% (20 MG/ML) 5 ML SYRINGE
INTRAMUSCULAR | Status: DC | PRN
  Administered 2017-10-28: 40 mg via INTRAVENOUS

## 2017-10-28 MED ORDER — CHLORHEXIDINE GLUCONATE CLOTH 2 % EX PADS: 6.0000 | MEDICATED_PAD | Freq: Once | CUTANEOUS | Status: DC

## 2017-10-28 MED ORDER — CEFAZOLIN SODIUM-DEXTROSE 2-4 GM/100ML-% IV SOLN
2.0000 g | INTRAVENOUS | Status: AC
  Administered 2017-10-28: 2 g via INTRAVENOUS
  Filled 2017-10-28: qty 100

## 2017-10-28 MED ORDER — HYDROMORPHONE HCL 1 MG/ML IJ SOLN
INTRAMUSCULAR | Status: AC
  Filled 2017-10-28: qty 1

## 2017-10-28 MED ORDER — BUPIVACAINE HCL 0.25 % IJ SOLN
INTRAMUSCULAR | Status: DC | PRN
  Administered 2017-10-28: 7 mL

## 2017-10-28 MED ORDER — ROCURONIUM BROMIDE 50 MG/5ML IV SOSY
PREFILLED_SYRINGE | INTRAVENOUS | Status: AC
  Filled 2017-10-28: qty 15

## 2017-10-28 MED ORDER — 0.9 % SODIUM CHLORIDE (POUR BTL) OPTIME
TOPICAL | Status: DC | PRN
  Administered 2017-10-28: 1000 mL

## 2017-10-28 MED ORDER — SODIUM CHLORIDE 0.9 % IR SOLN
Status: DC | PRN
  Administered 2017-10-28 (×2): 1000 mL

## 2017-10-28 MED ORDER — STERILE WATER FOR IRRIGATION IR SOLN
Status: DC | PRN
  Administered 2017-10-28: 1000 mL

## 2017-10-28 MED ORDER — MIDAZOLAM HCL 5 MG/5ML IJ SOLN
INTRAMUSCULAR | Status: DC | PRN
  Administered 2017-10-28: 2 mg via INTRAVENOUS

## 2017-10-28 MED ORDER — MIDAZOLAM HCL 2 MG/2ML IJ SOLN
INTRAMUSCULAR | Status: AC
  Filled 2017-10-28: qty 2

## 2017-10-28 MED ORDER — TRAMADOL HCL 50 MG PO TABS: 50.0000 mg | ORAL_TABLET | Freq: Four times a day (QID) | ORAL | 0 refills | Status: DC | PRN

## 2017-10-28 MED ORDER — SUGAMMADEX SODIUM 200 MG/2ML IV SOLN
INTRAVENOUS | Status: DC | PRN
  Administered 2017-10-28: 200 mg via INTRAVENOUS

## 2017-10-28 MED ORDER — ONDANSETRON HCL 4 MG/2ML IJ SOLN
INTRAMUSCULAR | Status: AC
  Filled 2017-10-28: qty 2

## 2017-10-28 MED ORDER — BUPIVACAINE HCL (PF) 0.25 % IJ SOLN
INTRAMUSCULAR | Status: AC
  Filled 2017-10-28: qty 30

## 2017-10-28 MED ORDER — GABAPENTIN 300 MG PO CAPS
300.0000 mg | ORAL_CAPSULE | ORAL | Status: AC
  Administered 2017-10-28: 300 mg via ORAL
  Filled 2017-10-28: qty 1

## 2017-10-28 MED ORDER — ACETAMINOPHEN 500 MG PO TABS
1000.0000 mg | ORAL_TABLET | ORAL | Status: AC
  Administered 2017-10-28: 1000 mg via ORAL
  Filled 2017-10-28: qty 2

## 2017-10-28 MED ORDER — ROCURONIUM BROMIDE 10 MG/ML (PF) SYRINGE
PREFILLED_SYRINGE | INTRAVENOUS | Status: DC | PRN
  Administered 2017-10-28: 50 mg via INTRAVENOUS

## 2017-10-28 MED ORDER — CELECOXIB 200 MG PO CAPS
200.0000 mg | ORAL_CAPSULE | ORAL | Status: AC
  Administered 2017-10-28: 200 mg via ORAL
  Filled 2017-10-28: qty 1

## 2017-10-28 MED ORDER — PROPOFOL 10 MG/ML IV BOLUS
INTRAVENOUS | Status: AC
  Filled 2017-10-28: qty 20

## 2017-10-28 MED ORDER — LACTATED RINGERS IV SOLN
INTRAVENOUS | Status: DC | PRN
  Administered 2017-10-28: 12:00:00 via INTRAVENOUS

## 2017-10-28 SURGICAL SUPPLY — 36 items
CANISTER SUCT 3000ML PPV (MISCELLANEOUS) ×2 IMPLANT
CHLORAPREP W/TINT 26ML (MISCELLANEOUS) ×2 IMPLANT
CLIP VESOLOCK MED LG 6/CT (CLIP) ×2 IMPLANT
COVER SURGICAL LIGHT HANDLE (MISCELLANEOUS) ×2 IMPLANT
COVER TRANSDUCER ULTRASND (DRAPES) ×2 IMPLANT
DEFOGGER SCOPE WARMER CLEARIFY (MISCELLANEOUS) ×2 IMPLANT
DERMABOND ADVANCED (GAUZE/BANDAGES/DRESSINGS) ×1
DERMABOND ADVANCED .7 DNX12 (GAUZE/BANDAGES/DRESSINGS) ×1 IMPLANT
ELECT REM PT RETURN 9FT ADLT (ELECTROSURGICAL) ×2
ELECTRODE REM PT RTRN 9FT ADLT (ELECTROSURGICAL) ×1 IMPLANT
GLOVE BIO SURGEON STRL SZ7.5 (GLOVE) ×2 IMPLANT
GOWN STRL REUS W/ TWL LRG LVL3 (GOWN DISPOSABLE) ×2 IMPLANT
GOWN STRL REUS W/ TWL XL LVL3 (GOWN DISPOSABLE) ×1 IMPLANT
GOWN STRL REUS W/TWL LRG LVL3 (GOWN DISPOSABLE) ×2
GOWN STRL REUS W/TWL XL LVL3 (GOWN DISPOSABLE) ×1
GRASPER SUT TROCAR 14GX15 (MISCELLANEOUS) ×2 IMPLANT
KIT BASIN OR (CUSTOM PROCEDURE TRAY) ×2 IMPLANT
KIT TURNOVER KIT B (KITS) ×2 IMPLANT
NEEDLE INSUFFLATION 14GA 120MM (NEEDLE) ×2 IMPLANT
NS IRRIG 1000ML POUR BTL (IV SOLUTION) ×2 IMPLANT
PAD ARMBOARD 7.5X6 YLW CONV (MISCELLANEOUS) ×2 IMPLANT
POUCH LAPAROSCOPIC INSTRUMENT (MISCELLANEOUS) ×2 IMPLANT
POUCH RETRIEVAL ECOSAC 10 (ENDOMECHANICALS) IMPLANT
POUCH RETRIEVAL ECOSAC 10MM (ENDOMECHANICALS)
SCISSORS LAP 5X35 DISP (ENDOMECHANICALS) ×2 IMPLANT
SET IRRIG TUBING LAPAROSCOPIC (IRRIGATION / IRRIGATOR) ×2 IMPLANT
SLEEVE ENDOPATH XCEL 5M (ENDOMECHANICALS) ×4 IMPLANT
SPECIMEN JAR SMALL (MISCELLANEOUS) ×2 IMPLANT
SUT MNCRL AB 4-0 PS2 18 (SUTURE) ×2 IMPLANT
TOWEL OR 17X24 6PK STRL BLUE (TOWEL DISPOSABLE) ×2 IMPLANT
TOWEL OR 17X26 10 PK STRL BLUE (TOWEL DISPOSABLE) ×2 IMPLANT
TRAY LAPAROSCOPIC MC (CUSTOM PROCEDURE TRAY) ×2 IMPLANT
TROCAR XCEL NON-BLD 11X100MML (ENDOMECHANICALS) ×2 IMPLANT
TROCAR XCEL NON-BLD 5MMX100MML (ENDOMECHANICALS) ×2 IMPLANT
TUBING INSUFFLATION (TUBING) ×2 IMPLANT
WATER STERILE IRR 1000ML POUR (IV SOLUTION) ×2 IMPLANT

## 2017-10-28 NOTE — Op Note (Signed)
10/28/2017  1:00 PM  PATIENT:  Rhonda Brown  52 y.o. female  PRE-OPERATIVE DIAGNOSIS:  GALLSTONES  POST-OPERATIVE DIAGNOSIS:  GALLSTONES, CHRONIC INFLAMATION OF GALLBLADDER  PROCEDURE:  Procedure(s): LAPAROSCOPIC CHOLECYSTECTOMY (N/A)  SURGEON:  Surgeon(s) and Role:    Ralene Ok, MD - Primary   ANESTHESIA:   local and general  EBL:  10 mL   BLOOD ADMINISTERED:none  DRAINS: none   LOCAL MEDICATIONS USED:  BUPIVICAINE   SPECIMEN:  Source of Specimen:  GALLBLADDER  DISPOSITION OF SPECIMEN:  PATHOLOGY  COUNTS:  YES  TOURNIQUET:  * No tourniquets in log *  DICTATION: .Dragon Dictation The patient was taken to the operating and placed in the supine position with bilateral SCDs in place.  The patient was prepped and draped in the usual sterile fashion. A time out was called and all facts were verified. A pneumoperitoneum was obtained via A Veress needle technique to a pressure of 60mm of mercury.  A 28mm trochar was then placed in the right upper quadrant under visualization, and there were no injuries to any abdominal organs. A 11 mm port was then placed in the umbilical region after infiltrating with local anesthesia under direct visualization. A second and third epigastric port and right lower quadrant port placement under direct visualization, respectively.    The gallbladder was identified and retracted, the peritoneum was then sharply dissected from the gallbladder and this dissection was carried down to Calot's triangle. The gallbladder was identified and stripped away circumferentially and seen going into the gallbladder 360, the critical angle was obtained.  2 clips were placed proximally one distally and the cystic duct transected. The cystic artery was identified and 2 clips placed proximally and one distally and transected.  We then proceeded to remove the gallbladder off the hepatic fossa with Bovie cautery. A retrieval bag was then placed in the abdomen and  gallbladder placed in the bag. The hepatic fossa was then reexamined and hemostasis was achieved with Bovie cautery and was excellent at the end of the case.   The subhepatic fossa and perihepatic fossa was then irrigated until the effluent was clear.  The gallbladder and bag were removed from the abdominal cavity. The 11 mm trocar fascia was reapproximated with the Endo Close #1 Vicryl X2.  The pneumoperitoneum was evacuated and all trochars removed under direct visulalization.  The skin was then closed with 4-0 Monocryl and the skin dressed with Dermabond.    The patient was awaken from general anesthesia and taken to the recovery room in stable condition.   PLAN OF CARE: Discharge to home after PACU  PATIENT DISPOSITION:  PACU - hemodynamically stable.   Delay start of Pharmacological VTE agent (>24hrs) due to surgical blood loss or risk of bleeding: not applicable

## 2017-10-28 NOTE — H&P (Signed)
History of Present IllnessThe patient is a 52 year old female who presents for evaluation of gall stones. Patient is a 52 year old female comes to follow back up for abdominal pain. She states that she continue with abdominal pain in epigastric region. She states is associated with nausea vomiting, as well as diarrhea. 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 Patient states that she still has abdominal pain even with her erythromycin.  We discussed the fact that she still has dark stools. Patient states that she was set up for colonoscopy because not tolerate the prep. --------------------------- The patient is a 52 year old female who is referred by Ellouise Newer, PA-C for evaluation of cholelithiasis. Patient was recently ER secondary to generalized abdominal pain. She states this is similar to her previous episode 5-10 years ago. She states that she was told that time this was likely due to "gastric paralysis. " Patient states she had generalized abdominal pain that this episode associated with nausea vomiting diarrhea. Patient states that the pain started a week or so prior to being seen in the ER and lasted for a week thereafter. She states she ultrasound as well as CT scan which revealed cholelithiasis and no signs of cholecystitis.  Patient states she underwent Hemoccult test which was negative however that I cannot find this in Epic. She states her stools were black.   Patient states that she was to undergo an endoscopy however secondary to her Effient this is currently not scheduled yet.   Allergies No Known Allergies [06/11/2016]: Allergies Reconciled   Medication History  TraMADol HCl (50MG  Tablet, Oral) Active. Atorvastatin Calcium (80MG  Tablet, Oral) Active. Hyoscyamine Sulfate (0.125MG  Tab Sublingual, Sublingual) Active. Isosorbide Mononitrate ER (30MG  Tablet ER 24HR, Oral) Active. Metoprolol Tartrate (25MG  Tablet, Oral)  Active. Nitroglycerin (0.4MG  Tab Sublingual, Sublingual) Active. Pantoprazole Sodium (40MG  Tablet DR, Oral) Active. Prasugrel HCl (10MG  Tablet, Oral) Active. RaNITidine HCl (150MG  Tablet, Oral) Active. Sucralfate (1GM Tablet, Oral) Active. Ibuprofen (200MG  Tablet, Oral) Active. Medications Reconciled     Review of Systems General Not Present- Appetite Loss, Chills, Fatigue, Fever, Night Sweats, Weight Gain and Weight Loss. Skin Not Present- Change in Wart/Mole, Dryness, Hives, Jaundice, New Lesions, Non-Healing Wounds, Rash and Ulcer. HEENT Not Present- Earache, Hearing Loss, Hoarseness, Nose Bleed, Oral Ulcers, Ringing in the Ears, Seasonal Allergies, Sinus Pain, Sore Throat, Visual Disturbances, Wears glasses/contact lenses and Yellow Eyes. Respiratory Not Present- Cough and Difficulty Breathing. Breast Not Present- Breast Mass, Breast Pain, Nipple Discharge and Skin Changes. Cardiovascular Not Present- Chest Pain, Difficulty Breathing Lying Down, Leg Cramps, Palpitations, Rapid Heart Rate, Shortness of Breath and Swelling of Extremities. Gastrointestinal Present- Abdominal Pain, Bloating, Chronic diarrhea, Indigestion, Nausea and Vomiting. Not Present- Bloody Stool, Change in Bowel Habits, Constipation, Difficulty Swallowing, Excessive gas, Gets full quickly at meals, Hemorrhoids and Rectal Pain. Female Genitourinary Present- Frequency. Not Present- Nocturia, Painful Urination, Pelvic Pain and Urgency. Musculoskeletal Not Present- Back Pain, Joint Pain, Joint Stiffness, Muscle Pain, Muscle Weakness and Swelling of Extremities. Neurological Not Present- Decreased Memory, Fainting, Headaches, Numbness, Seizures, Tingling, Tremor, Trouble walking and Weakness. Psychiatric Not Present- Anxiety, Bipolar, Change in Sleep Pattern, Depression, Fearful and Frequent crying. Endocrine Present- Hot flashes. Not Present- Cold Intolerance, Excessive Hunger, Hair Changes, Heat Intolerance and  New Diabetes. Hematology Present- Blood Thinners. Not Present- Easy Bruising, Excessive bleeding, Gland problems, HIV and Persistent Infections.  BP 100/73   Pulse 72   Temp (!) 97.5 F (36.4 C) (Oral)   Resp 20   Ht 5'  1" (1.549 m)   Wt 81.6 kg   SpO2 98%   BMI 34.01 kg/m    Physical Exam  The physical exam findings are as follows: Note:Constitutional: No acute distress, conversant, appears stated age  Eyes: Anicteric sclerae, moist conjunctiva, no lid lag  Neck: No thyromegaly, trachea midline, no cervical lymphadenopathy  Lungs: Clear to auscultation biilaterally, normal respiratory effot  Cardiovascular: regular rate & rhythm, no murmurs, no peripheal edema, pedal pulses 2+  GI: Soft, no masses or hepatosplenomegaly, non-tender to palpation  MSK: Normal gait, no clubbing cyanosis, edema  Skin: No rashes, palpation reveals normal skin turgor  Psychiatric: Appropriate judgment and insight, oriented to person, place, and time    Assessment & Plan CALCULUS OF GALLBLADDER WITHOUT CHOLECYSTITIS WITHOUT OBSTRUCTION (K80.20) Impression: 52 year old female with likely symptomatic cholelithiasis 1. Patient will require cardiac clearance to be off of her antiplatelet medication prior to surgery. 2. We will schedule her for lap Cholecystectomy 3. Risks and benefits were discussed with the patient to generally include, but not limited to: infection, bleeding, possible need for post op ERCP, damage to the bile ducts, bile leak, and possible need for further surgery. Alternatives were offered and described. All questions were answered and the patient voiced understanding of the procedure and wishes to proceed at this point with a laparoscopic cholecystectomy

## 2017-10-28 NOTE — Anesthesia Procedure Notes (Signed)
Procedure Name: Intubation Date/Time: 10/28/2017 12:17 PM Performed by: Marsa Aris, CRNA Pre-anesthesia Checklist: Patient identified, Emergency Drugs available, Suction available and Patient being monitored Patient Re-evaluated:Patient Re-evaluated prior to induction Oxygen Delivery Method: Circle System Utilized Preoxygenation: Pre-oxygenation with 100% oxygen Induction Type: IV induction Ventilation: Mask ventilation without difficulty and Oral airway inserted - appropriate to patient size Laryngoscope Size: Sabra Heck and 2 Grade View: Grade I Tube type: Oral Number of attempts: 1 Airway Equipment and Method: Stylet and Oral airway Placement Confirmation: ETT inserted through vocal cords under direct vision,  positive ETCO2 and breath sounds checked- equal and bilateral Secured at: 21 cm Tube secured with: Tape Dental Injury: Teeth and Oropharynx as per pre-operative assessment

## 2017-10-28 NOTE — Anesthesia Postprocedure Evaluation (Signed)
Anesthesia Post Note  Patient: Rhonda Brown  Procedure(s) Performed: LAPAROSCOPIC CHOLECYSTECTOMY (N/A Abdomen)     Patient location during evaluation: PACU Anesthesia Type: General Level of consciousness: awake and alert Pain management: pain level controlled Vital Signs Assessment: post-procedure vital signs reviewed and stable Respiratory status: spontaneous breathing, nonlabored ventilation, respiratory function stable and patient connected to nasal cannula oxygen Cardiovascular status: blood pressure returned to baseline and stable Postop Assessment: no apparent nausea or vomiting Anesthetic complications: no    Last Vitals:  Vitals:   10/28/17 1515 10/28/17 1520  BP:  (!) 158/86  Pulse: (!) 58   Resp: 15   Temp:  (!) 36.3 C  SpO2: 96%     Last Pain:  Vitals:   10/28/17 1500  TempSrc:   PainSc: 10-Worst pain ever                 Jeyli Zwicker COKER

## 2017-10-28 NOTE — Transfer of Care (Signed)
Immediate Anesthesia Transfer of Care Note  Patient: Rhonda Brown  Procedure(s) Performed: LAPAROSCOPIC CHOLECYSTECTOMY (N/A Abdomen)  Patient Location: PACU  Anesthesia Type:General  Level of Consciousness: awake, alert  and oriented  Airway & Oxygen Therapy: Patient Spontanous Breathing and Patient connected to nasal cannula oxygen  Post-op Assessment: Report given to RN and Post -op Vital signs reviewed and stable  Post vital signs: Reviewed and stable  Last Vitals:  Vitals Value Taken Time  BP 157/96 10/28/2017  1:17 PM  Temp 36.4 C 10/28/2017  1:18 PM  Pulse 79 10/28/2017  1:23 PM  Resp 20 10/28/2017  1:23 PM  SpO2 92 % 10/28/2017  1:23 PM  Vitals shown include unvalidated device data.  Last Pain:  Vitals:   10/28/17 0902  TempSrc: Oral  PainSc: 0-No pain      Patients Stated Pain Goal: 7 (70/96/28 3662)  Complications: No apparent anesthesia complications

## 2017-10-28 NOTE — Discharge Instructions (Signed)
[facility] ______[facility] °LAPAROSCOPIC SURGERY: POST OP INSTRUCTIONS °Always review your discharge instruction sheet given to you by the facility where your surgery was performed. °IF YOU HAVE DISABILITY OR FAMILY LEAVE FORMS, YOU MUST BRING THEM TO THE OFFICE FOR PROCESSING.   °DO NOT GIVE THEM TO YOUR DOCTOR. ° °1. A prescription for pain medication may be given to you upon discharge.  Take your pain medication as prescribed, if needed.  If narcotic pain medicine is not needed, then you may take acetaminophen (Tylenol) or ibuprofen (Advil) as needed. °2. Take your usually prescribed medications unless otherwise directed. °3. If you need a refill on your pain medication, please contact your pharmacy.  They will contact our office to request authorization. Prescriptions will not be filled after 5pm or on week-ends. °4. You should follow a light diet the first few days after arrival home, such as soup and crackers, etc.  Be sure to include lots of fluids daily. °5. Most patients will experience some swelling and bruising in the area of the incisions.  Ice packs will help.  Swelling and bruising can take several days to resolve.  °6. It is common to experience some constipation if taking pain medication after surgery.  Increasing fluid intake and taking a stool softener (such as Colace) will usually help or prevent this problem from occurring.  A mild laxative (Milk of Magnesia or Miralax) should be taken according to package instructions if there are no bowel movements after 48 hours. °7. Unless discharge instructions indicate otherwise, you may remove your bandages 24-48 hours after surgery, and you may shower at that time.  You may have steri-strips (small skin tapes) in place directly over the incision.  These strips should be left on the skin for 7-10 days.  If your surgeon used skin glue on the incision, you may shower in 24 hours.  The glue will flake off over the next 2-3 weeks.  Any sutures or  staples will be removed at the office during your follow-up visit. °8. ACTIVITIES:  You may resume regular (light) daily activities beginning the next day--such as daily self-care, walking, climbing stairs--gradually increasing activities as tolerated.  You may have sexual intercourse when it is comfortable.  Refrain from any heavy lifting or straining until approved by your doctor. °a. You may drive when you are no longer taking prescription pain medication, you can comfortably wear a seatbelt, and you can safely maneuver your car and apply brakes. °b. RETURN TO WORK:  __________________________________________________________ °9. You should see your doctor in the office for a follow-up appointment approximately 2-3 weeks after your surgery.  Make sure that you call for this appointment within a day or two after you arrive home to insure a convenient appointment time. °10. OTHER INSTRUCTIONS: __________________________________________________________________________________________________________________________ __________________________________________________________________________________________________________________________ °WHEN TO CALL YOUR DOCTOR: °1. Fever over 101.0 °2. Inability to urinate °3. Continued bleeding from incision. °4. Increased pain, redness, or drainage from the incision. °5. Increasing abdominal pain ° °The clinic staff is available to answer your questions during regular business hours.  Please don’t hesitate to call and ask to speak to one of the nurses for clinical concerns.  If you have a medical emergency, go to the nearest emergency room or call 911.  A surgeon from [facility] is always on call at the hospital. °[address], [address], [place], [place]  [address] ? [address] [address], [place],    [address] °[phone] ? [phone] ? FAX [phone] °Web site:   [email or web address] ° ° °Post Anesthesia Home Care Instructions ° °Activity: °Get plenty of rest for the remainder of the day. A responsible individual must stay with you for 24 hours following the procedure.  °For the next 24 hours, DO NOT: °-Drive a car °-Operate machinery °-Drink alcoholic beverages °-Take any medication unless instructed by your physician °-Make any legal decisions or sign important papers. ° °Meals: °Start with liquid foods such as gelatin or soup. Progress to regular foods as tolerated. Avoid greasy, spicy, heavy foods. If nausea and/or vomiting occur, drink only clear liquids until the nausea and/or vomiting subsides. Call your physician if vomiting continues. ° °Special Instructions/Symptoms: °Your throat may feel dry or sore from the anesthesia or the breathing tube placed in your throat during surgery. If this causes discomfort, gargle with warm salt water. The discomfort should disappear within 24 hours. ° °If you had a scopolamine patch placed behind your ear for the management of post- operative nausea and/or vomiting: ° °1. The medication in the patch is effective for 72 hours, after which it should be removed.  Wrap patch in a tissue and discard in the trash. Wash hands thoroughly with soap and water. °2. You may remove the patch earlier than 72 hours if you experience unpleasant side effects which may include dry mouth, dizziness or visual disturbances. °3. Avoid touching the patch. Wash your hands with soap and water after contact with the patch. °   ° °

## 2017-10-29 ENCOUNTER — Encounter (HOSPITAL_COMMUNITY): Payer: Self-pay | Admitting: General Surgery

## 2017-11-04 ENCOUNTER — Other Ambulatory Visit: Payer: Self-pay | Admitting: Physician Assistant

## 2017-11-04 ENCOUNTER — Other Ambulatory Visit: Payer: Self-pay | Admitting: *Deleted

## 2017-11-04 MED ORDER — CETIRIZINE HCL 10 MG PO TABS: ORAL_TABLET | ORAL | 0 refills | Status: DC

## 2017-11-24 ENCOUNTER — Ambulatory Visit: Payer: BLUE CROSS/BLUE SHIELD

## 2017-11-25 ENCOUNTER — Ambulatory Visit: Payer: BLUE CROSS/BLUE SHIELD | Admitting: Cardiology

## 2017-11-30 ENCOUNTER — Other Ambulatory Visit: Payer: Self-pay | Admitting: Family Medicine

## 2017-12-01 NOTE — Telephone Encounter (Addendum)
Patient called, left VM to return call to the office. Will need to ask patient if she is wanting to stay at [facility] and establish with another provider. If so, an appointment will need to be made. 30 day courtesy refill.

## 2017-12-15 ENCOUNTER — Other Ambulatory Visit: Payer: Self-pay | Admitting: Physician Assistant

## 2017-12-28 ENCOUNTER — Other Ambulatory Visit: Payer: Self-pay | Admitting: Family Medicine

## 2017-12-29 NOTE — Telephone Encounter (Signed)
Former pt of Philis Fendt. Has not made an appointment with another provider at the office. Attempted to notify patient, left message to call the office back regarding prescriptions and office visits. Requested Prescriptions  Refused Prescriptions Disp Refills  . cetirizine (ZYRTEC) 10 MG tablet [Pharmacy Med Name: CETIRIZINE HCL 10 MG TABLET] 30 tablet 0    Sig: TAKE 1/2 TO 1 TABLET EVERY DAY     Ear, Nose, and Throat:  Antihistamines Passed - 12/28/2017 12:09 AM      Passed - Valid encounter within last 12 months    Recent Outpatient Visits          3 months ago Annual physical exam   [facility], PA-C   5 months ago Acute bacterial rhinosinusitis   [facility], PA-C

## 2017-12-31 ENCOUNTER — Ambulatory Visit
Admission: RE | Admit: 2017-12-31 | Discharge: 2017-12-31 | Disposition: A | Discharge status: Home / Self Care | Payer: BLUE CROSS/BLUE SHIELD | Source: Ambulatory Visit | Attending: Physician Assistant | Admitting: Physician Assistant

## 2017-12-31 DIAGNOSIS — Z1231 Encounter for screening mammogram for malignant neoplasm of breast: Secondary | ICD-10-CM

## 2018-01-02 ENCOUNTER — Other Ambulatory Visit: Payer: Self-pay | Admitting: Physician Assistant

## 2018-01-02 DIAGNOSIS — R928 Other abnormal and inconclusive findings on diagnostic imaging of breast: Secondary | ICD-10-CM

## 2018-01-07 ENCOUNTER — Ambulatory Visit
Admission: RE | Admit: 2018-01-07 | Discharge: 2018-01-07 | Disposition: A | Discharge status: Home / Self Care | Payer: BLUE CROSS/BLUE SHIELD | Source: Ambulatory Visit | Attending: Physician Assistant | Admitting: Physician Assistant

## 2018-01-07 DIAGNOSIS — N6011 Diffuse cystic mastopathy of right breast: Secondary | ICD-10-CM | POA: Diagnosis not present

## 2018-01-07 DIAGNOSIS — R922 Inconclusive mammogram: Secondary | ICD-10-CM | POA: Diagnosis not present

## 2018-01-07 DIAGNOSIS — R928 Other abnormal and inconclusive findings on diagnostic imaging of breast: Secondary | ICD-10-CM

## 2018-01-07 DIAGNOSIS — N6012 Diffuse cystic mastopathy of left breast: Secondary | ICD-10-CM | POA: Diagnosis not present

## 2018-01-30 ENCOUNTER — Ambulatory Visit: Payer: BLUE CROSS/BLUE SHIELD

## 2018-02-08 ENCOUNTER — Other Ambulatory Visit: Payer: Self-pay | Admitting: Physician Assistant

## 2018-02-19 ENCOUNTER — Other Ambulatory Visit: Payer: Self-pay | Admitting: Physician Assistant

## 2018-03-24 ENCOUNTER — Other Ambulatory Visit: Payer: Self-pay

## 2018-03-24 ENCOUNTER — Other Ambulatory Visit: Payer: Self-pay | Admitting: Physician Assistant

## 2018-03-24 ENCOUNTER — Telehealth: Payer: Self-pay | Admitting: Cardiology

## 2018-03-24 ENCOUNTER — Emergency Department (HOSPITAL_COMMUNITY): Payer: BLUE CROSS/BLUE SHIELD

## 2018-03-24 ENCOUNTER — Ambulatory Visit: Payer: BLUE CROSS/BLUE SHIELD | Admitting: Cardiology

## 2018-03-24 ENCOUNTER — Encounter (HOSPITAL_COMMUNITY): Payer: Self-pay | Admitting: Emergency Medicine

## 2018-03-24 ENCOUNTER — Observation Stay (HOSPITAL_COMMUNITY)
Admission: EM | Admit: 2018-03-24 | Discharge: 2018-03-26 | Disposition: A | Discharge status: Home / Self Care | Payer: BLUE CROSS/BLUE SHIELD | Attending: Cardiology | Admitting: Cardiology

## 2018-03-24 DIAGNOSIS — F17210 Nicotine dependence, cigarettes, uncomplicated: Secondary | ICD-10-CM | POA: Diagnosis not present

## 2018-03-24 DIAGNOSIS — E669 Obesity, unspecified: Secondary | ICD-10-CM | POA: Diagnosis present

## 2018-03-24 DIAGNOSIS — I2583 Coronary atherosclerosis due to lipid rich plaque: Secondary | ICD-10-CM | POA: Diagnosis not present

## 2018-03-24 DIAGNOSIS — R079 Chest pain, unspecified: Secondary | ICD-10-CM | POA: Diagnosis present

## 2018-03-24 DIAGNOSIS — Z7982 Long term (current) use of aspirin: Secondary | ICD-10-CM | POA: Diagnosis not present

## 2018-03-24 DIAGNOSIS — Z79899 Other long term (current) drug therapy: Secondary | ICD-10-CM | POA: Diagnosis not present

## 2018-03-24 DIAGNOSIS — E7800 Pure hypercholesterolemia, unspecified: Secondary | ICD-10-CM

## 2018-03-24 DIAGNOSIS — E785 Hyperlipidemia, unspecified: Secondary | ICD-10-CM | POA: Diagnosis present

## 2018-03-24 DIAGNOSIS — R0789 Other chest pain: Principal | ICD-10-CM | POA: Insufficient documentation

## 2018-03-24 DIAGNOSIS — I2510 Atherosclerotic heart disease of native coronary artery without angina pectoris: Secondary | ICD-10-CM | POA: Diagnosis not present

## 2018-03-24 DIAGNOSIS — Z7901 Long term (current) use of anticoagulants: Secondary | ICD-10-CM | POA: Insufficient documentation

## 2018-03-24 DIAGNOSIS — Z720 Tobacco use: Secondary | ICD-10-CM | POA: Diagnosis present

## 2018-03-24 DIAGNOSIS — F1290 Cannabis use, unspecified, uncomplicated: Secondary | ICD-10-CM | POA: Diagnosis present

## 2018-03-24 LAB — I-STAT TROPONIN, ED
Troponin i, poc: 0 ng/mL (ref 0.00–0.08)
Troponin i, poc: 0.01 ng/mL (ref 0.00–0.08)
Troponin i, poc: 0 ng/mL (ref 0.00–0.08)

## 2018-03-24 LAB — CBC
HCT: 47.5 % — ABNORMAL HIGH (ref 36.0–46.0)
HCT: 43.7 % (ref 36.0–46.0)
Hemoglobin: 14.8 g/dL (ref 12.0–15.0)
Hemoglobin: 14 g/dL (ref 12.0–15.0)
MCH: 25.9 pg — ABNORMAL LOW (ref 26.0–34.0)
MCH: 26.1 pg (ref 26.0–34.0)
MCHC: 31.2 g/dL (ref 30.0–36.0)
MCHC: 32 g/dL (ref 30.0–36.0)
MCV: 83.2 fL (ref 80.0–100.0)
MCV: 81.5 fL (ref 80.0–100.0)
PLATELETS: 196 10*3/uL (ref 150–400)
Platelets: 224 10*3/uL (ref 150–400)
RBC: 5.71 MIL/uL — ABNORMAL HIGH (ref 3.87–5.11)
RBC: 5.36 MIL/uL — AB (ref 3.87–5.11)
RDW: 15.2 % (ref 11.5–15.5)
RDW: 15 % (ref 11.5–15.5)
WBC: 9.9 10*3/uL (ref 4.0–10.5)
WBC: 8.7 10*3/uL (ref 4.0–10.5)
nRBC: 0 % (ref 0.0–0.2)
nRBC: 0 % (ref 0.0–0.2)

## 2018-03-24 LAB — BASIC METABOLIC PANEL
ANION GAP: 11 (ref 5–15)
BUN: 6 mg/dL (ref 6–20)
CO2: 22 mmol/L (ref 22–32)
Calcium: 9.3 mg/dL (ref 8.9–10.3)
Chloride: 107 mmol/L (ref 98–111)
Creatinine, Ser: 0.91 mg/dL (ref 0.44–1.00)
GFR calc Af Amer: >60 mL/min (ref >60)
Glucose, Bld: 141 mg/dL — ABNORMAL HIGH (ref 70–99)
Potassium: 3.4 mmol/L — ABNORMAL LOW (ref 3.5–5.1)
Sodium: 140 mmol/L (ref 135–145)

## 2018-03-24 LAB — CREATININE, SERUM
CREATININE: 0.86 mg/dL (ref 0.44–1.00)
GFR calc Af Amer: >60 mL/min (ref >60)
GFR calc non Af Amer: >60 mL/min (ref >60)

## 2018-03-24 LAB — I-STAT BETA HCG BLOOD, ED (MC, WL, AP ONLY): I-stat hCG, quantitative: 8.2 m[IU]/mL — ABNORMAL HIGH (ref <5)

## 2018-03-24 LAB — TROPONIN I: Troponin I: <0.03 ng/mL (ref <0.03)

## 2018-03-24 LAB — HEMOGLOBIN A1C
Hgb A1c MFr Bld: 6.7 % — ABNORMAL HIGH (ref 4.8–5.6)
Mean Plasma Glucose: 145.59 mg/dL

## 2018-03-24 MED ORDER — ONDANSETRON HCL 4 MG/2ML IJ SOLN: 4.0000 mg | Freq: Four times a day (QID) | INTRAMUSCULAR | Status: DC | PRN

## 2018-03-24 MED ORDER — ASPIRIN 81 MG PO TABS: 81.0000 mg | ORAL_TABLET | Freq: Every day | ORAL | Status: DC

## 2018-03-24 MED ORDER — BUPROPION HCL ER (SR) 150 MG PO TB12: 150.0000 mg | ORAL_TABLET | Freq: Every day | ORAL | Status: DC | PRN

## 2018-03-24 MED ORDER — METOPROLOL TARTRATE 25 MG PO TABS
25.0000 mg | ORAL_TABLET | Freq: Two times a day (BID) | ORAL | Status: DC
  Administered 2018-03-24 – 2018-03-25 (×3): 25 mg via ORAL
  Filled 2018-03-24 (×3): qty 1

## 2018-03-24 MED ORDER — ASPIRIN EC 81 MG PO TBEC
81.0000 mg | DELAYED_RELEASE_TABLET | Freq: Every day | ORAL | Status: DC
  Administered 2018-03-25 – 2018-03-26 (×2): 81 mg via ORAL
  Filled 2018-03-24 (×3): qty 1

## 2018-03-24 MED ORDER — ASPIRIN 300 MG RE SUPP: 300.0000 mg | RECTAL | Status: DC

## 2018-03-24 MED ORDER — ACETAMINOPHEN 325 MG PO TABS
650.0000 mg | ORAL_TABLET | Freq: Four times a day (QID) | ORAL | Status: DC | PRN
  Administered 2018-03-25 – 2018-03-26 (×2): 650 mg via ORAL
  Filled 2018-03-24 (×2): qty 2

## 2018-03-24 MED ORDER — NITROGLYCERIN 0.4 MG SL SUBL: 0.4000 mg | SUBLINGUAL_TABLET | SUBLINGUAL | Status: DC | PRN

## 2018-03-24 MED ORDER — ISOSORBIDE MONONITRATE ER 30 MG PO TB24
30.0000 mg | ORAL_TABLET | Freq: Every day | ORAL | Status: DC
  Administered 2018-03-25: 30 mg via ORAL
  Filled 2018-03-24: qty 1

## 2018-03-24 MED ORDER — HEPARIN SODIUM (PORCINE) 5000 UNIT/ML IJ SOLN: 5000.0000 [IU] | Freq: Three times a day (TID) | INTRAMUSCULAR | Status: DC

## 2018-03-24 MED ORDER — ATORVASTATIN CALCIUM 80 MG PO TABS
80.0000 mg | ORAL_TABLET | Freq: Every day | ORAL | Status: DC
  Administered 2018-03-24 – 2018-03-25 (×2): 80 mg via ORAL
  Filled 2018-03-24 (×2): qty 1

## 2018-03-24 MED ORDER — ZOLPIDEM TARTRATE 5 MG PO TABS
5.0000 mg | ORAL_TABLET | Freq: Every evening | ORAL | Status: DC | PRN
  Administered 2018-03-24 – 2018-03-25 (×2): 5 mg via ORAL
  Filled 2018-03-24 (×2): qty 1

## 2018-03-24 MED ORDER — NITROGLYCERIN 2 % TD OINT
1.0000 [in_us] | TOPICAL_OINTMENT | Freq: Once | TRANSDERMAL | Status: AC
  Administered 2018-03-24: 1 [in_us] via TOPICAL
  Filled 2018-03-24: qty 1

## 2018-03-24 MED ORDER — NICOTINE 14 MG/24HR TD PT24
14.0000 mg | MEDICATED_PATCH | Freq: Every day | TRANSDERMAL | Status: DC | PRN
  Administered 2018-03-25: 14 mg via TRANSDERMAL
  Filled 2018-03-24: qty 1

## 2018-03-24 MED ORDER — SODIUM CHLORIDE 0.9% FLUSH: 3.0000 mL | Freq: Once | INTRAVENOUS | Status: DC

## 2018-03-24 MED ORDER — ASPIRIN 81 MG PO CHEW
243.0000 mg | CHEWABLE_TABLET | Freq: Once | ORAL | Status: AC
  Administered 2018-03-24: 243 mg via ORAL
  Filled 2018-03-24: qty 3

## 2018-03-24 MED ORDER — CLOPIDOGREL BISULFATE 75 MG PO TABS
75.0000 mg | ORAL_TABLET | Freq: Every day | ORAL | Status: DC
  Administered 2018-03-25 – 2018-03-26 (×2): 75 mg via ORAL
  Filled 2018-03-24 (×2): qty 1

## 2018-03-24 MED ORDER — NITROGLYCERIN 0.4 MG SL SUBL
SUBLINGUAL_TABLET | SUBLINGUAL | Status: AC
  Administered 2018-03-24: 0.4 mg
  Filled 2018-03-24: qty 1

## 2018-03-24 MED ORDER — ACETAMINOPHEN 325 MG PO TABS: 650.0000 mg | ORAL_TABLET | ORAL | Status: DC | PRN

## 2018-03-24 MED ORDER — ASPIRIN 81 MG PO CHEW: 324.0000 mg | CHEWABLE_TABLET | ORAL | Status: DC
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
## 2018-03-24 NOTE — Telephone Encounter (Signed)
New Message        Pt c/o of Chest Pain: STAT if CP now or developed within 24 hours  1. Are you having CP right now? Yes/along with shoulder  2. Are you experiencing any other symptoms (ex. SOB, nausea, vomiting, sweating)? Sweating  3. How long have you been experiencing CP? Last night  4. Is your CP continuous or coming and going? Coming and going  5. Have you taken Nitroglycerin? No  ?

## 2018-03-24 NOTE — Progress Notes (Addendum)
Pt arrived from ED c/o chest pain 6/10 in chest and left shoulder and arm.  Administered nitro SL x1 with good relief. EKG completed.  PA Barret made aware.  Idolina Primer, RN

## 2018-03-24 NOTE — ED Triage Notes (Signed)
Onset one week ago chest pain took her nitro with relief. Last night developed chest pain and left shoulder pain continued today. Called doctor this morning told to go to the ED for evaluation.

## 2018-03-24 NOTE — Telephone Encounter (Signed)
Returned call to patient she stated she woke up this morning with pain in left neck radiating down left arm.She also had heavy perspiration and sob.Stated she had episode last week with left arm and left neck pain.She took 3 NTG with relief.Stated she has not took any NTG this morning.She rates pain # 6.Stated appointment was scheduled with Kerin Ransom PA this afternoon.Advised she needs to go to [facility] ED now.Appointment with Lurena Joiner cancelled.

## 2018-03-25 ENCOUNTER — Observation Stay (HOSPITAL_BASED_OUTPATIENT_CLINIC_OR_DEPARTMENT_OTHER): Payer: BLUE CROSS/BLUE SHIELD

## 2018-03-25 ENCOUNTER — Observation Stay (HOSPITAL_COMMUNITY): Payer: BLUE CROSS/BLUE SHIELD

## 2018-03-25 ENCOUNTER — Other Ambulatory Visit: Payer: Self-pay

## 2018-03-25 ENCOUNTER — Encounter (HOSPITAL_COMMUNITY): Payer: Self-pay | Admitting: General Practice

## 2018-03-25 DIAGNOSIS — E7800 Pure hypercholesterolemia, unspecified: Secondary | ICD-10-CM | POA: Diagnosis not present

## 2018-03-25 DIAGNOSIS — Z7901 Long term (current) use of anticoagulants: Secondary | ICD-10-CM | POA: Diagnosis not present

## 2018-03-25 DIAGNOSIS — I2583 Coronary atherosclerosis due to lipid rich plaque: Secondary | ICD-10-CM | POA: Diagnosis not present

## 2018-03-25 DIAGNOSIS — I2510 Atherosclerotic heart disease of native coronary artery without angina pectoris: Secondary | ICD-10-CM | POA: Diagnosis not present

## 2018-03-25 DIAGNOSIS — F17210 Nicotine dependence, cigarettes, uncomplicated: Secondary | ICD-10-CM | POA: Diagnosis not present

## 2018-03-25 DIAGNOSIS — I340 Nonrheumatic mitral (valve) insufficiency: Secondary | ICD-10-CM

## 2018-03-25 DIAGNOSIS — R079 Chest pain, unspecified: Secondary | ICD-10-CM | POA: Diagnosis not present

## 2018-03-25 DIAGNOSIS — R0789 Other chest pain: Secondary | ICD-10-CM | POA: Diagnosis not present

## 2018-03-25 HISTORY — PX: CARDIOVASCULAR STRESS TEST: SHX262

## 2018-03-25 LAB — BASIC METABOLIC PANEL
Anion gap: 11 (ref 5–15)
BUN: 5 mg/dL — AB (ref 6–20)
CALCIUM: 8.9 mg/dL (ref 8.9–10.3)
CO2: 21 mmol/L — ABNORMAL LOW (ref 22–32)
CREATININE: 0.81 mg/dL (ref 0.44–1.00)
Chloride: 106 mmol/L (ref 98–111)
GFR calc Af Amer: >60 mL/min (ref >60)
GFR calc non Af Amer: >60 mL/min (ref >60)
Glucose, Bld: 124 mg/dL — ABNORMAL HIGH (ref 70–99)
Potassium: 3.4 mmol/L — ABNORMAL LOW (ref 3.5–5.1)
Sodium: 138 mmol/L (ref 135–145)

## 2018-03-25 LAB — LIPID PANEL
Cholesterol: 192 mg/dL (ref 0–200)
HDL: 23 mg/dL — ABNORMAL LOW (ref >40)
LDL Cholesterol: 122 mg/dL — ABNORMAL HIGH (ref 0–99)
Total CHOL/HDL Ratio: 8.3 RATIO
Triglycerides: 237 mg/dL — ABNORMAL HIGH (ref <150)
VLDL: 47 mg/dL — ABNORMAL HIGH (ref 0–40)

## 2018-03-25 LAB — CBC
HCT: 43.4 % (ref 36.0–46.0)
Hemoglobin: 13.8 g/dL (ref 12.0–15.0)
MCH: 26.1 pg (ref 26.0–34.0)
MCHC: 31.8 g/dL (ref 30.0–36.0)
MCV: 82.2 fL (ref 80.0–100.0)
Platelets: 209 10*3/uL (ref 150–400)
RBC: 5.28 MIL/uL — AB (ref 3.87–5.11)
RDW: 14.9 % (ref 11.5–15.5)
WBC: 7.9 10*3/uL (ref 4.0–10.5)
nRBC: 0 % (ref 0.0–0.2)

## 2018-03-25 LAB — NM MYOCAR MULTI W/SPECT W/WALL MOTION / EF
Estimated workload: 1 METS
Exercise duration (min): 0 min
Exercise duration (sec): 0 s
MPHR: 170 {beats}/min
Peak HR: 133 {beats}/min
Percent HR: 78 %
Rest HR: 68 {beats}/min

## 2018-03-25 LAB — ECHOCARDIOGRAM COMPLETE
HEIGHTINCHES: 61 in
Weight: 3067.2 oz

## 2018-03-25 LAB — TROPONIN I
Troponin I: <0.03 ng/mL (ref <0.03)
Troponin I: <0.03 ng/mL (ref <0.03)

## 2018-03-25 LAB — HIV ANTIBODY (ROUTINE TESTING W REFLEX): HIV Screen 4th Generation wRfx: NONREACTIVE

## 2018-03-25 MED ORDER — TECHNETIUM TC 99M TETROFOSMIN IV KIT
30.0000 | PACK | Freq: Once | INTRAVENOUS | Status: AC | PRN
  Administered 2018-03-25: 30 via INTRAVENOUS

## 2018-03-25 MED ORDER — REGADENOSON 0.4 MG/5ML IV SOLN
0.4000 mg | Freq: Once | INTRAVENOUS | Status: AC
  Administered 2018-03-25: 0.4 mg via INTRAVENOUS
  Filled 2018-03-25: qty 5

## 2018-03-25 MED ORDER — IOPAMIDOL (ISOVUE-370) INJECTION 76%
100.0000 mL | Freq: Once | INTRAVENOUS | Status: AC | PRN
  Administered 2018-03-25: 80 mL via INTRAVENOUS

## 2018-03-25 MED ORDER — REGADENOSON 0.4 MG/5ML IV SOLN
INTRAVENOUS | Status: AC
  Filled 2018-03-25: qty 5

## 2018-03-25 MED ORDER — PERFLUTREN LIPID MICROSPHERE
1.0000 mL | INTRAVENOUS | Status: AC | PRN
  Administered 2018-03-25: 2 mL via INTRAVENOUS
  Filled 2018-03-25: qty 10

## 2018-03-25 MED ORDER — TECHNETIUM TC 99M TETROFOSMIN IV KIT
10.0000 | PACK | Freq: Once | INTRAVENOUS | Status: AC | PRN
  Administered 2018-03-25: 10 via INTRAVENOUS

## 2018-03-25 NOTE — Progress Notes (Signed)
I notified pt that echo and nuc study look good but to be thorough Dr. Radford Pax wants to do a CT of chest.  Pt understands and questions were answered, hope to d/c in AM.

## 2018-03-25 NOTE — Progress Notes (Signed)
   Patient presented for a nuclear stress test today. Patient symptomatic after administration of Lexiscan with shortness of breath, nausea, vomiting, and chest pain. Symptoms resolved on their own and did not require Aminophyline.   Stress imaging is pending at this time.   Preliminary EKG findings may be listed in the chart, but the stress test result will not be finalized until perfusion imaging is complete.  Darreld Mclean, PA-C 03/25/2018 11:35 AM

## 2018-03-25 NOTE — Progress Notes (Signed)
Progress Note  Patient Name: Rhonda Brown Date of Encounter: 03/25/2018  Primary Cardiologist: Fransico Him, MD   Subjective   Patient had more chest pain and left shoulder pain last night which resolved with sublingual nitro x1.  Negative.  Inpatient Medications    Scheduled Meds: . aspirin EC  81 mg Oral Daily  . atorvastatin  80 mg Oral QHS  . clopidogrel  75 mg Oral Daily  . heparin  5,000 Units Subcutaneous Q8H  . isosorbide mononitrate  30 mg Oral Daily  . metoprolol tartrate  25 mg Oral BID  . sodium chloride flush  3 mL Intravenous Once   Continuous Infusions:  PRN Meds: acetaminophen, buPROPion, nicotine, nitroGLYCERIN, ondansetron (ZOFRAN) IV, zolpidem   Vital Signs    Vitals:   03/24/18 1900 03/24/18 2011 03/24/18 2324 03/25/18 0517  BP: (!) 140/91 98/71 115/85 112/70  Pulse:  (!) 58 73 70  Resp:  17    Temp:  97.8 F (36.6 C)  98.1 F (36.7 C)  TempSrc:  Oral  Oral  SpO2:  98%  95%  Weight:    87 kg  Height:       No intake or output data in the 24 hours ending 03/25/18 0831 Filed Weights   03/24/18 0927 03/25/18 0517  Weight: 86.6 kg 87 kg    Telemetry    NSR - Personally Reviewed  ECG    Sinus rhythm, low voltage in the limb leads, anterior T wave abnormality.- Personally Reviewed  Physical Exam   GEN: No acute distress.   Neck: No JVD Cardiac: RRR, no murmurs, rubs, or gallops.  Respiratory: Clear to auscultation bilaterally. GI: Soft, nontender, non-distended  MS: No edema; No deformity. Neuro:  Nonfocal  Psych: Normal affect   Labs    Chemistry Recent Labs  Lab 03/24/18 0938 03/24/18 1858  NA 140  --   K 3.4*  --   CL 107  --   CO2 22  --   GLUCOSE 141*  --   BUN 6  --   CREATININE 0.91 0.86  CALCIUM 9.3  --   GFRNONAA >60 >60  GFRAA >60 >60  ANIONGAP 11  --      Hematology Recent Labs  Lab 03/24/18 0938 03/24/18 1858 03/25/18 0721  WBC 9.9 8.7 7.9  RBC 5.71* 5.36* 5.28*  HGB 14.8 14.0 13.8  HCT  47.5* 43.7 43.4  MCV 83.2 81.5 82.2  MCH 25.9* 26.1 26.1  MCHC 31.2 32.0 31.8  RDW 15.2 15.0 14.9  PLT 224 196 209    Cardiac Enzymes Recent Labs  Lab 03/24/18 1858 03/25/18 0020  TROPONINI <0.03 <0.03    Recent Labs  Lab 03/24/18 0935 03/24/18 0936 03/24/18 1443  TROPIPOC 0.01 0.00 0.00     BNPNo results for input(s): BNP, PROBNP in the last 168 hours.   DDimer No results for input(s): DDIMER in the last 168 hours.   Radiology    Dg Chest 2 View  Result Date: 03/24/2018 CLINICAL DATA:  Chest pain. EXAM: CHEST - 2 VIEW COMPARISON:  Radiographs of January 21, 2017. FINDINGS: The heart size and mediastinal contours are within normal limits. Both lungs are clear. No pneumothorax or pleural effusion is noted. The visualized skeletal structures are unremarkable. IMPRESSION: No active cardiopulmonary disease. Electronically Signed   By: Marijo Conception, M.D.   On: 03/24/2018 09:49    Cardiac Studies   Cardiac Cath 02/27/2016 Conclusions: 1. Widely patent stent in the proximal LAD. 2.  Mild, non-obstructive CAD involving diagonal branches jailed by LAD stent, as well as the mid LCx. 3. Focal irregularity involving the mid LMCA.  Question if this is a small ulcerated plaque or area of guide-induced trauma from recent PCI.  No evidence of dissection. 4. Normal left ventricular contraction. 5. Normal left ventricular filling pressure.  Recommendations: 1. Continue medical therapy and aggressive secondary prevention. Agree with switching from ticagrelor to prasugrel, as at least some of the patient's post-cath symptoms could be related to medication side-effects  Patient Profile     53 y.o. female CAD status post MI treated with DES to the LAD 01/2016 [facility] in [place], repeat cath 02/2016 at [facility] widely patent LAD stent and mild nonobstructive CAD of the diagonal jailed by the LAD stent and mild disease in circumflex with medical therapy recommended, HLD and tobacco  abuse.  Assessment & Plan    1. Chest pain with prior history of CAD s/p PCI/DES to LAD: -Pt noted to have chronic chest pain  -Pt with a prior hx of CAD s/p MI treated with DES to the LAD 01/2016 [facility] in [place], repeat cath 02/2016 at [facility] widely patent LAD stent and mild nonobstructive CAD of the diagonal jailed by the LAD stent and mild disease in circumflex. Medical therapy recommended. Imdur was added at that time.  -Echocardiogram 02/2016 with LVEF 55-60% with normal wall motion and mild LVH -EKG with no acute changes this admission -Troponin negative x 3 -CXR with no acute cardiopulmonary disease  -check 2D echo to assess for pericardial effusion -patient is NPO for nuclear stress test today to assess for ischemia -Continue ASA, high intensity statin, Plavix, isosorbide mononitrate 30, metoprolol 25  2. HTN: -BP stable at 112/13mmHg -Continue metoprolol 25 mg twice daily, isosorbide mononitrate 30 mg daily  3. HLD: -Last LDL on 09/03/2017, 114 -Continue high intensity statin -Reports compliance  4. Tobacco Use: -Smoking cessation strongly encouraged  -Patient reports decreased intake of 4 cigarettes/day -Motivated to quit -Now on nicotine patch   For questions or updates, please contact [place] Please consult [email or web address] for contact info under Cardiology/STEMI.      Signed, Fransico Him, MD  03/25/2018, 8:31 AM

## 2018-03-25 NOTE — Progress Notes (Signed)
  Echocardiogram 2D Echocardiogram was attempted but patient was in Nuclear Medicine.   Jennette Dubin 03/25/2018, 10:12 AM

## 2018-03-25 NOTE — Progress Notes (Signed)
  Echocardiogram 2D Echocardiogram has been performed.  Jennette Dubin 03/25/2018, 2:14 PM

## 2018-03-26 DIAGNOSIS — I2510 Atherosclerotic heart disease of native coronary artery without angina pectoris: Secondary | ICD-10-CM | POA: Diagnosis not present

## 2018-03-26 DIAGNOSIS — Z720 Tobacco use: Secondary | ICD-10-CM | POA: Diagnosis not present

## 2018-03-26 DIAGNOSIS — E7800 Pure hypercholesterolemia, unspecified: Secondary | ICD-10-CM | POA: Diagnosis not present

## 2018-03-26 DIAGNOSIS — R079 Chest pain, unspecified: Secondary | ICD-10-CM | POA: Diagnosis not present

## 2018-03-26 MED ORDER — METOPROLOL TARTRATE 25 MG PO TABS: 12.5000 mg | ORAL_TABLET | Freq: Two times a day (BID) | ORAL | 8 refills | Status: DC

## 2018-03-26 MED ORDER — ISOSORBIDE MONONITRATE ER 60 MG PO TB24
60.0000 mg | ORAL_TABLET | Freq: Every day | ORAL | Status: DC
  Administered 2018-03-26: 60 mg via ORAL
  Filled 2018-03-26: qty 1

## 2018-03-26 MED ORDER — ISOSORBIDE MONONITRATE ER 60 MG PO TB24: 30.0000 mg | ORAL_TABLET | Freq: Every day | ORAL | 3 refills | Status: DC

## 2018-03-26 MED ORDER — METOPROLOL TARTRATE 12.5 MG HALF TABLET
12.5000 mg | ORAL_TABLET | Freq: Two times a day (BID) | ORAL | Status: DC
  Administered 2018-03-26: 12.5 mg via ORAL
  Filled 2018-03-26: qty 1

## 2018-03-26 NOTE — Discharge Summary (Signed)
Discharge Summary    Patient ID: Rhonda Brown MRN: [id number]; DOB: September 27, 1965  Admit date: 03/24/2018 Discharge date: 03/26/2018  Primary Care Provider: Patient, No Pcp Per  Primary Cardiologist: Fransico Him, MD  Primary Electrophysiologist:  None   Discharge Diagnoses    Principal Problem:   Chest pain Active Problems:   Tobacco abuse   Hyperlipidemia   CAD (coronary artery disease)   Obesity   Marijuana use   Allergies Allergies  Allergen Reactions  . Cat Hair Extract     UNSPECIFIED REACTION     Diagnostic Studies/Procedures    Echo 03/25/18:  1. The left ventricle appears to be normal in size, has normal wall thickness 60-65% ejection fraction Spectral Doppler shows nondiagnostic pattern of diastolic filling.  2. Normal Regional wall motion.  3. Right ventricular systolic pressure is is normal.  4. The right ventricle has normal size and normal systolic function.  5. Normal left atrial size.  6. Normal right atrial size.  7. Mitral valve regurgitation is mild by color flow Doppler.  8. The mitral valve normal in structure and function.  9. Normal tricuspid valve. 10. Aortic valve normal. 11. No atrial level shunt detected by color flow Doppler. 12. The interatrial septum appears to be lipomatous.    Myoview 03/25/18:  There was no ST segment deviation noted during stress.  T wave inversion was noted during stress in the II, III, V3, V2, V4 and V5 leads. During infusion  Nuclear stress EF: 68%. No wall motion abnormalities  There were no perfusion defects at stress and at rest. No ischemia, no infarcts.  This is a low risk study.   CT chest 03/25/18: IMPRESSION: 1. No acute findings. Specifically, no evidence for acute pulmonary embolus or thoracic aortic dissection. 2.  Emphysema.    Cardiac Cath 02/27/2016 Conclusions: 1. Widely patent stent in the proximal LAD. 2. Mild, non-obstructive CAD involving diagonal branches jailed by LAD stent, as  well as the mid LCx. 3. Focal irregularity involving the mid LMCA. Question if this is a small ulcerated plaque or area of guide-induced trauma from recent PCI. No evidence of dissection. 4. Normal left ventricular contraction. 5. Normal left ventricular filling pressure.  Recommendations: 1. Continue medical therapy and aggressive secondary prevention. Agree with switching from ticagrelor to prasugrel, as at least some of the patient's post-cath symptoms could be related to medication side-effects   History of Present Illness     Rhonda Brown is a 53 y.o. female with a hx of CAD status post MI treated with DES to the LAD 01/2016 [facility] in [place], repeat cath 02/2016 at [facility] widely patent LAD stent and mild nonobstructive CAD of the diagonal jailed by the LAD stent and mild disease in circumflex with medical therapy recommended, HLD and tobacco abuse who was seen for the evaluation of chest pain.   Rhonda Brown is a 53yo F with a hx as stated above who presented to [facility] on 03/24/2018 with complaints of chest pain which initially occurred last Tuesday after waking in the morning.  Patient states she was in her usual state of health and went to sleep feeling okay and woke with midsternal chest discomfort with radiation to her left shoulder and neck with no associated symptoms.  She called her daughter in which her daughter recommended taking SL NTG.  After taking 3 tablets patient reports her symptoms subsided although reports a significant headache.  She states that since that time she had no recurrent symptoms until this  morning when she awoke with similar presentation.  She describes waxing and waning midsternal chest heaviness with radiation to her left shoulder.  She states she had no shortness of breath, palpitations, dizziness, LE swelling, orthopnea or syncope.  She states the heaviness is worse with lying flat.  She reports no recent illness including fevers, chills, nausea or  vomiting.  She reports no long distance travel.  She did not take another SLN TG secondary to her worsening headache with prior episode.  She called the office for further assistance and was recommended that she proceed to the emergency department for further evaluation.  In the ED, initial iStat troponin was found to 0.00. EKG with no acute changes, improved from prior tracings on 08/2017. CXR with no active cardiopulmonary disease.  hCG found to be elevated however appears that she has a chronically elevated level and is undergone a tubal ligation.   Of note, she has a prior hx of CAD s/p MI treated with DES to the LAD 01/2016 [facility] in [place], repeat cath 02/2016 at [facility] widely patent LAD stent and mild nonobstructive CAD of the diagonal jailed by the LAD stent and mild disease in circumflex. Medical therapy recommended. Imdur was added at that time. Echocardiogram 01//2018 with LVEF 55-60% with normal wall motion and mild LVH.  Per patient report, she underwent a stress test prior to her recent endoscopy/colonoscopy however this was aborted given significant vomiting during procedure.  Pt was seen 05/2016 and continued to have exertional chest pain with dyspnea. Ranexa was added to her regimen. She was then in the ER 10/2016 again with chest pain after lifting a heavy cabinet with troponins and EKGs negative. Admission advised at that time but she leftAMA.  On 03/24/2017 her Effient was changed to Plavix secondary to affordability.  She was then seen in clinic on 04/14/2017 for pre-operative to come off Plavix for endoscopy and colonoscopy. Pt noted to have chronic chest pain which was noted to be on-going since her MI. She reported that the pain will occur while sitting at her desk and will go away with laying flat. She says that she had been smoking marijuana (one ounce/month) for pain relief. At that time, she was unable to walk up inclines without becoming symptomatic. She stated that  she could no longer afford Ranexa and was noted to already be taking Imdur. During that visit, her Imdur was increased to 30mg  daily.   This past week when she developed chest pain with radiation to her left shoulder and neck.  She says the symptoms were not really like her prior MI.  She took 3 sublingual nitroglycerin which improved her symptoms.  She had no further chest pain until this morning 03/24/18 when she awakened with similar symptoms.  Denies any shortness of breath, nausea, diaphoresis.  She called the office and was told to come to the ER.  Initial troponin in the ER was 0 and EKG shows no acute changes.  Chest x-ray shows no active disease.  She was admitted to cardiology for further evaluation.   Hospital Course     Consultants:   Pt was admitted and underwent echocardiogram, nuclear stress test, and CT chest. Echocardiogram showed normal EF and no wall motion abnormality. Nuclear stress test showed no reversible ischemia. CT chest without evidence of PE or dissection, but evidence of emphysema. Troponin x 3 negative.   She has had no further chest pain overnight, but states that she battles chronic angina at home. She  can't afford ranexa. She states that a higher dose of imdur helped in the past, but her BP is marginal. Given her heart rates in the 50s, will decrease lopressor to 12.5 mg BID and increase imdur to 60 mg daily. Continue ASA and plavix for CAD. Continue high intensity statin. Encouraged smoking cessation.  She was seen and examined and deemed stable for discharge. Case management referral entered to establish PCP and hospital follow up appt. Pt also advised to follow up with GI.  _____________  Discharge Vitals Blood pressure 105/83, pulse 79, temperature 97.7 F (36.5 C), resp. rate 17, height 5\' 1"  (1.549 m), weight 86.1 kg, SpO2 96 %.  Filed Weights   03/24/18 0927 03/25/18 0517 03/26/18 0529  Weight: 86.6 kg 87 kg 86.1 kg    Labs & Radiologic Studies      CBC Recent Labs    03/24/18 1858 03/25/18 0721  WBC 8.7 7.9  HGB 14.0 13.8  HCT 43.7 43.4  MCV 81.5 82.2  PLT 196 025   Basic Metabolic Panel Recent Labs    03/24/18 0938 03/24/18 1858 03/25/18 0721  NA 140  --  138  K 3.4*  --  3.4*  CL 107  --  106  CO2 22  --  21*  GLUCOSE 141*  --  124*  BUN 6  --  5*  CREATININE 0.91 0.86 0.81  CALCIUM 9.3  --  8.9   Liver Function Tests No results for input(s): AST, ALT, ALKPHOS, BILITOT, PROT, ALBUMIN in the last 72 hours. No results for input(s): LIPASE, AMYLASE in the last 72 hours. Cardiac Enzymes Recent Labs    03/24/18 1858 03/25/18 0020 03/25/18 0721  TROPONINI <0.03 <0.03 <0.03   BNP Invalid input(s): POCBNP D-Dimer No results for input(s): DDIMER in the last 72 hours. Hemoglobin A1C Recent Labs    03/24/18 1902  HGBA1C 6.7*   Fasting Lipid Panel Recent Labs    03/25/18 0020  CHOL 192  HDL 23*  LDLCALC 122*  TRIG 237*  CHOLHDL 8.3   Thyroid Function Tests No results for input(s): TSH, T4TOTAL, T3FREE, THYROIDAB in the last 72 hours.  Invalid input(s): FREET3 _____________  Dg Chest 2 View  Result Date: 03/24/2018 CLINICAL DATA:  Chest pain. EXAM: CHEST - 2 VIEW COMPARISON:  Radiographs of January 21, 2017. FINDINGS: The heart size and mediastinal contours are within normal limits. Both lungs are clear. No pneumothorax or pleural effusion is noted. The visualized skeletal structures are unremarkable. IMPRESSION: No active cardiopulmonary disease. Electronically Signed   By: Marijo Conception, M.D.   On: 03/24/2018 09:49   Nm Myocar Multi W/spect W/wall Motion / Ef  Result Date: 03/25/2018  There was no ST segment deviation noted during stress.  T wave inversion was noted during stress in the II, III, V3, V2, V4 and V5 leads. During infusion  Nuclear stress EF: 68%. No wall motion abnormalities  There were no perfusion defects at stress and at rest. No ischemia, no infarcts.  This is a low risk  study.  Candee Furbish, MD   Ct Angio Chest Aorta W/cm &/or Wo/cm  Result Date: 03/25/2018 CLINICAL DATA:  Left-sided pain. PE versus aortic dissection. EXAM: CT ANGIOGRAPHY CHEST WITH CONTRAST TECHNIQUE: Multidetector CT imaging of the chest was performed using the standard protocol during bolus administration of intravenous contrast. Multiplanar CT image reconstructions and MIPs were obtained to evaluate the vascular anatomy. CONTRAST:  71mL ISOVUE-370 IOPAMIDOL (ISOVUE-370) INJECTION 76% COMPARISON:  None. FINDINGS: Cardiovascular:  The heart size is normal. No substantial pericardial effusion. No thoracic aortic aneurysm. No dissection of the thoracic aorta. No filling defect within the opacified pulmonary arteries to suggest the presence of an acute pulmonary embolus. No filling defect in the opacified pulmonary arteries to suggest the presence of an acute pulmonary embolus. Mediastinum/Nodes: No mediastinal lymphadenopathy. There is no hilar lymphadenopathy. The esophagus has normal imaging features. There is no axillary lymphadenopathy. Lungs/Pleura: Centrilobular emphysema noted in the lungs bilaterally. Hazy opacity in the dependent lungs likely compressive atelectasis. No pleural effusion. No focal airspace consolidation. Upper Abdomen: Unremarkable. Musculoskeletal: No worrisome lytic or sclerotic osseous abnormality. Review of the MIP images confirms the above findings. IMPRESSION: 1. No acute findings. Specifically, no evidence for acute pulmonary embolus or thoracic aortic dissection. 2.  Emphysema. ([id number]-S92.9) Electronically Signed   By: Misty Stanley M.D.   On: 03/25/2018 20:13   Disposition   Pt is being discharged home today in good condition.  Follow-up Plans & Appointments    Follow-up Information    [name], MD Follow up on 04/03/2018.   Specialty:  Cardiology Why:  3:40 pm for hospital follow up Contact information: [address] [place]  [address] [phone]          Discharge Instructions    Diet - low sodium heart healthy   Complete by:  As directed    Increase activity slowly   Complete by:  As directed       Discharge Medications   Allergies as of 03/26/2018      Reactions   Cat Hair Extract    UNSPECIFIED REACTION       Medication List    TAKE these medications   acetaminophen 325 MG tablet Commonly known as:  TYLENOL Take 650 mg by mouth every 6 (six) hours as needed.   aspirin 81 MG tablet Take 81 mg by mouth daily.   atorvastatin 80 MG tablet Commonly known as:  LIPITOR Take 1 tablet (80 mg total) by mouth at bedtime.   buPROPion 150 MG 12 hr tablet Commonly known as:  WELLBUTRIN SR Take 150 mg by mouth daily as needed (nicotine cravings).   cetirizine 10 MG tablet Commonly known as:  ZYRTEC TAKE 1/2 TO 1 TABLET EVERY DAY What changed:  See the new instructions.   clopidogrel 75 MG tablet Commonly known as:  PLAVIX TAKE 1 TABLET BY MOUTH EVERY DAY   diphenhydrAMINE 25 MG tablet Commonly known as:  BENADRYL Take 25 mg by mouth daily as needed for allergies.   erythromycin 250 MG tablet Commonly known as:  E-MYCIN Take 250 mg by mouth 4 (four) times daily.   isosorbide mononitrate 60 MG 24 hr tablet Commonly known as:  IMDUR Take 0.5 tablets (30 mg total) by mouth daily. What changed:  medication strength   metoprolol tartrate 25 MG tablet Commonly known as:  LOPRESSOR Take 0.5 tablets (12.5 mg total) by mouth 2 (two) times daily. What changed:  how much to take   nicotine 14 mg/24hr patch Commonly known as:  NICODERM CQ - dosed in mg/24 hours Place 14 mg onto the skin daily as needed (nicotine cravings).   nitroGLYCERIN 0.4 MG SL tablet Commonly known as:  NITROSTAT Place 1 tablet (0.4 mg total) under the tongue every 5 (five) minutes as needed for chest pain.   ondansetron 8 MG tablet Commonly known as:  ZOFRAN Take 8 mg by mouth every 8 (eight) hours as needed for  nausea/vomiting.   ranitidine 150 MG capsule Commonly known as:  ZANTAC Take 150 mg by mouth 2 (two) times daily.   traMADol 50 MG tablet Commonly known as:  ULTRAM Take 1 tablet (50 mg total) by mouth every 6 (six) hours as needed. What changed:  reasons to take this         Acute coronary syndrome (MI, NSTEMI, STEMI, etc) this admission?: No.    Outstanding Labs/Studies   none  Duration of Discharge Encounter   Greater than 30 minutes including physician time.  Signed, Tami Lin Kholton Coate, PA 03/26/2018, 9:44 AM

## 2018-03-26 NOTE — Care Management Note (Signed)
Case Management Note  Patient Details  Name: Pearley Millington MRN: [id number] Date of Birth: Nov 04, 1965  Subjective/Objective:  Pt presented for Chest Pain. PTA independent from home. Plan for transition home.         Action/Plan: Referral received for new PCP. Patient had previous PCP at [facility] and PCP moved to different practice. Patient called back to same office for different provider and scheduled appointment. No further needs from CM at this time.   Expected Discharge Date:  03/26/18               Expected Discharge Plan:  Home/Self Care  In-House Referral:  NA  Discharge planning Services  CM Consult  Post Acute Care Choice:  NA Choice offered to:  NA  DME Arranged:  N/A DME Agency:  NA  HH Arranged:  NA HH Agency:  NA  Status of Service:  Completed, signed off  If discussed at [place] of Stay Meetings, dates discussed:    Additional Comments:  Bethena Roys, RN 03/26/2018, 10:09 AM

## 2018-03-31 ENCOUNTER — Telehealth: Payer: Self-pay | Admitting: Cardiology

## 2018-03-31 NOTE — Telephone Encounter (Signed)
New Message   PT is calling because she was seen in the ER and she said Dr Radford Pax told her she didn't need to f/u on 02/07  Please call and let her know if she is suppose to come or not  She also has paperwork she is wondering if she can drop them off and then pick them up

## 2018-03-31 NOTE — Telephone Encounter (Signed)
Spoke with the patient, earlier, she stated I should leave a detailed message. Advised her of Dr. Theodosia Blender recommendations and she will need to call back soon to schedule a 4 weeks follow up.

## 2018-03-31 NOTE — Telephone Encounter (Signed)
Needs to followup in about 4 weeks unless she has recurrent CP

## 2018-03-31 NOTE — Telephone Encounter (Signed)
Spoke with the patient, she stated she spoke with Dr. Radford Pax in the hospital and she stated she did not need her upcoming appointment on 2/7. Sending to Dr. Radford Pax for recommendations.  The patient also wanted to drop off FMLA paperwork and she was advised the fee and instructions. She had no further questions.

## 2018-04-03 ENCOUNTER — Ambulatory Visit: Payer: BLUE CROSS/BLUE SHIELD | Admitting: Cardiology

## 2018-04-06 NOTE — Telephone Encounter (Signed)
Left a detailed message to schedule her 4 week follow up.

## 2018-04-19 ENCOUNTER — Other Ambulatory Visit: Payer: Self-pay | Admitting: Cardiology

## 2018-05-07 ENCOUNTER — Encounter: Payer: Self-pay | Admitting: Cardiology

## 2018-05-07 ENCOUNTER — Ambulatory Visit (INDEPENDENT_AMBULATORY_CARE_PROVIDER_SITE_OTHER): Payer: BLUE CROSS/BLUE SHIELD | Admitting: Cardiology

## 2018-05-07 ENCOUNTER — Other Ambulatory Visit: Payer: Self-pay

## 2018-05-07 VITALS — BP 106/68 | HR 75 | Ht 61.0 in | Wt 197.8 lb

## 2018-05-07 DIAGNOSIS — I25118 Atherosclerotic heart disease of native coronary artery with other forms of angina pectoris: Secondary | ICD-10-CM

## 2018-05-07 MED ORDER — RANOLAZINE ER 500 MG PO TB12: 500.0000 mg | ORAL_TABLET | Freq: Two times a day (BID) | ORAL | 3 refills | Status: DC

## 2018-05-07 MED ORDER — ISOSORBIDE MONONITRATE ER 30 MG PO TB24: 30.0000 mg | ORAL_TABLET | Freq: Every day | ORAL | 3 refills | Status: DC

## 2018-05-07 NOTE — Patient Instructions (Signed)
Medication Instructions:  START RANEXA 500 mg TWICE PER DAY TAKE IMDUR 30mg  DAILY  If you need a refill on your cardiac medications before your next appointment, please call your pharmacy.   Lab work: NONE If you have labs (blood work) drawn today and your tests are completely normal, you will receive your results only by: Marland Kitchen MyChart Message (if you have MyChart) OR . A paper copy in the mail If you have any lab test that is abnormal or we need to change your treatment, we will call you to review the results.  Testing/Procedures: NONE  Follow-Up: [place], you and your health needs are our priority.  As part of our continuing mission to provide you with exceptional heart care, we have created designated Provider Care Teams.  These Care Teams include your primary Cardiologist (physician) and Advanced Practice Providers (APPs -  Physician Assistants and Nurse Practitioners) who all work together to provide you with the care you need, when you need it. .   Any Other Special Instructions Will Be Listed Below (If Applicable). Ranolazine tablets, extended release What is this medicine? RANOLAZINE (ra NOE la zeen) is a heart medicine. It is used to treat chronic chest pain (angina). This medicine must be taken regularly. It will not relieve an acute episode of chest pain. This medicine may be used for other purposes; ask your health care provider or pharmacist if you have questions. COMMON BRAND NAME(S): Ranexa What should I tell my health care provider before I take this medicine? They need to know if you have any of these conditions: -heart disease -irregular heartbeat -kidney disease -liver disease -low levels of potassium or magnesium in the blood -an unusual or allergic reaction to ranolazine, other medicines, foods, dyes, or preservatives -pregnant or trying to get pregnant -breast-feeding How should I use this medicine? Take this medicine  by mouth with a glass of water. Follow the directions on the prescription label. Do not cut, crush, or chew this medicine. Take with or without food. Do not take this medication with grapefruit juice. Take your doses at regular intervals. Do not take your medicine more often then directed. Talk to your pediatrician regarding the use of this medicine in children. Special care may be needed. Overdosage: If you think you have taken too much of this medicine contact a poison control center or emergency room at once. NOTE: This medicine is only for you. Do not share this medicine with others. What if I miss a dose? If you miss a dose, take it as soon as you can. If it is almost time for your next dose, take only that dose. Do not take double or extra doses. What may interact with this medicine? Do not take this medicine with any of the following medications: -antivirals for HIV or AIDS -cerivastatin -certain antibiotics like chloramphenicol, clarithromycin, dalfopristin; quinupristin, isoniazid, rifabutin, rifampin, rifapentine -certain medicines used for cancer like imatinib, nilotinib -certain medicines for fungal infections like fluconazole, itraconazole, ketoconazole, posaconazole, voriconazole -certain medicines for irregular heart beat like dofetilide, dronedarone -certain medicines for seizures like carbamazepine, fosphenytoin, oxcarbazepine, phenobarbital, phenytoin -cisapride -conivaptan -cyclosporine -grapefruit or grapefruit juice -lumacaftor; ivacaftor -nefazodone -pimozide -quinacrine -St John's wort -thioridazine -ziprasidone This medicine may also interact with the following medications: -alfuzosin -certain medicines for depression, anxiety, or psychotic disturbances like bupropion, citalopram, fluoxetine, fluphenazine, paroxetine, perphenazine, risperidone, sertraline, trifluoperazine -certain medicines for cholesterol like atorvastatin, lovastatin, simvastatin -certain  medicines for stomach problems like octreotide,  palonosetron, prochlorperazine -eplerenone -ergot alkaloids like dihydroergotamine, ergonovine, ergotamine, methylergonovine -metformin -nicardipine -other medicines that prolong the QT interval (cause an abnormal heart rhythm) -sirolimus -tacrolimus This list may not describe all possible interactions. Give your health care provider a list of all the medicines, herbs, non-prescription drugs, or dietary supplements you use. Also tell them if you smoke, drink alcohol, or use illegal drugs. Some items may interact with your medicine. What should I watch for while using this medicine? Visit your doctor for regular check ups. Tell your doctor or healthcare professional if your symptoms do not start to get better or if they get worse. This medicine will not relieve an acute attack of angina or chest pain. This medicine can change your heart rhythm. Your health care provider may check your heart rhythm by ordering an electrocardiogram (ECG) while you are taking this medicine. You may get drowsy or dizzy. Do not drive, use machinery, or do anything that needs mental alertness until you know how this medicine affects you. Do not stand or sit up quickly, especially if you are an older patient. This reduces the risk of dizzy or fainting spells. Alcohol may interfere with the effect of this medicine. Avoid alcoholic drinks. If you are scheduled for any medical or dental procedure, tell your healthcare provider that you are taking this medicine. This medicine can interact with other medicines used during surgery. What side effects may I notice from receiving this medicine? Side effects that you should report to your doctor or health care professional as soon as possible: -allergic reactions like skin rash, itching or hives, swelling of the face, lips, or tongue -breathing problems -changes in vision -fast, irregular or pounding heartbeat -feeling faint or  lightheaded, falls -low or high blood pressure -numbness or tingling feelings -ringing in the ears -tremor or shakiness -slow heartbeat (fewer than 50 beats per minute) -swelling of the legs or feet Side effects that usually do not require medical attention (report to your doctor or health care professional if they continue or are bothersome): -constipation -drowsy -dry mouth -headache -nausea or vomiting -stomach upset This list may not describe all possible side effects. Call your doctor for medical advice about side effects. You may report side effects to FDA at 1-800-FDA-1088. Where should I keep my medicine? Keep out of the reach of children. Store at room temperature between 15 and 30 degrees C (59 and 86 degrees F). Throw away any unused medicine after the expiration date. NOTE: This sheet is a summary. It may not cover all possible information. If you have questions about this medicine, talk to your doctor, pharmacist, or health care provider.  2019 Elsevier/Gold Standard (2015-03-16 12:24:15)
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
## 2018-05-13 ENCOUNTER — Ambulatory Visit: Payer: BLUE CROSS/BLUE SHIELD | Admitting: Family Medicine

## 2018-05-15 ENCOUNTER — Other Ambulatory Visit: Payer: Self-pay | Admitting: Cardiology

## 2018-05-25 ENCOUNTER — Telehealth: Payer: Self-pay | Admitting: Cardiology

## 2018-05-25 NOTE — Telephone Encounter (Signed)
Correct - recommend continuing blood thinners including Plavix for single dental extraction.

## 2018-05-25 NOTE — Telephone Encounter (Signed)
PT is going to have emergency dental work done tomorrow at [facility] . She would like to know how long she will need to be off her blood thinners before her dental work. She had to be off them for at least 5 days before her gallbladder surgery  She has not taken her blood thinner yet today     [facility] Pre-operative Risk Assessment    Request for surgical clearance:  1. What type of surgery is being performed? Tooth removal  2. When is this surgery scheduled? Tomorrow, 05/26/18 at 11am   3. What type of clearance is required (medical clearance vs. Pharmacy clearance to hold med vs. Both)? pharmacy  4. Are there any medications that need to be held prior to surgery and how long? Blood thinner  5. Practice name and name of physician performing surgery? Pt didn't state  6. What is your office phone number   7.   What is your office fax number  8.   Anesthesia type (None, local, MAC, general) ? Local (possibly)   Johnna Acosta 05/25/2018, 9:52 AM  _________________________________________________________________   (provider comments below)

## 2018-05-25 NOTE — Telephone Encounter (Signed)
I discussed with pt and she does not need to hold any meds.  Pt appreciated the call.

## 2018-05-25 NOTE — Telephone Encounter (Signed)
Pharm, for one tooth removal we would not recommend holding plavix ?

## 2018-06-04 ENCOUNTER — Telehealth: Payer: Self-pay

## 2018-06-04 NOTE — Telephone Encounter (Signed)
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
## 2018-06-15 ENCOUNTER — Encounter: Payer: Self-pay | Admitting: Cardiology

## 2018-06-15 ENCOUNTER — Telehealth (INDEPENDENT_AMBULATORY_CARE_PROVIDER_SITE_OTHER): Payer: BLUE CROSS/BLUE SHIELD | Admitting: Cardiology

## 2018-06-15 ENCOUNTER — Other Ambulatory Visit: Payer: Self-pay

## 2018-06-15 ENCOUNTER — Telehealth: Payer: Self-pay | Admitting: Cardiology

## 2018-06-15 VITALS — Ht 61.0 in | Wt 196.0 lb

## 2018-06-15 DIAGNOSIS — I208 Other forms of angina pectoris: Secondary | ICD-10-CM

## 2018-06-15 MED ORDER — RANOLAZINE ER 1000 MG PO TB12: 500.0000 mg | ORAL_TABLET | Freq: Two times a day (BID) | ORAL | 3 refills | Status: AC

## 2018-06-15 NOTE — Telephone Encounter (Signed)
New Message:    Patient calling she is having trouble. Please call patient back.

## 2018-06-15 NOTE — Patient Instructions (Signed)
Medication Instructions:  INCREASE RANEXA to 1000 mg twice daily If you need a refill on your cardiac medications before your next appointment, please call your pharmacy.   Lab work: none If you have labs (blood work) drawn today and your tests are completely normal, you will receive your results only by: Marland Kitchen MyChart Message (if you have MyChart) OR . A paper copy in the mail If you have any lab test that is abnormal or we need to change your treatment, we will call you to review the results.  Testing/Procedures: none  Follow-Up: 6 months with Dr Radford Pax At [facility], you and your health needs are our priority.  As part of our continuing mission to provide you with exceptional heart care, we have created designated Provider Care Teams.  These Care Teams include your primary Cardiologist (physician) and Advanced Practice Providers (APPs -  Physician Assistants and Nurse Practitioners) who all work together to provide you with the care you need, when you need it.  Any Other Special Instructions Will Be Listed Below (If Applicable).

## 2018-06-24 ENCOUNTER — Telehealth: Payer: Self-pay | Admitting: Cardiology

## 2018-06-24 NOTE — Telephone Encounter (Signed)
New Message             Patient is calling today because she was notified by [facility] that the "Randazine" has a drug to drug reaction to one of her other medications. Patient is not taking the medication until someone calls her to explain. Pls call to advise.

## 2018-06-25 NOTE — Telephone Encounter (Signed)
I spoke to [facility] pharmacist who said that there was no indication of adverse reaction with any of her other medications.  I will notify the patient.

## 2018-06-25 NOTE — Telephone Encounter (Signed)
lpmtcb 4/30

## 2018-06-26 NOTE — Telephone Encounter (Signed)
I spoke to the patient and confirmed information about her medication from [facility].  She verbalized understanding.

## 2018-07-07 ENCOUNTER — Other Ambulatory Visit: Payer: Self-pay | Admitting: Cardiology

## 2018-07-17 ENCOUNTER — Telehealth: Payer: Self-pay | Admitting: Cardiology

## 2018-07-17 NOTE — Telephone Encounter (Signed)
Pt is wanting to speak with Ellen Henri PA-C and RN about FMLA paperwork that was recently filled out. Pt states that Dr Radford Pax filled her paperwork out in January and she advised that the pt needed intermittent FMLA for her heart condition.   Pt states she had some sick days that were heart related in the months of Jan and Feb, where multiple cardiac meds were changed, and she went over a couple days specified on her "duration and frequency" of her intermittent FMLA for her heart condition.  Because of this, [employer] then sent the papers back to our office to be an addendum to her current intermittent fmla papers, to add in supporting information for this.  Pt states then [place] filled them out and now its marked that pt does not need intermittent FMLA at all for her medical condition.  Pt states that since that's marked, she will lose her job. Pt states all she needed filled out was to show supporting information why she took more sick days than FMLA provided her.  Pt states this would be much easier explained if she could talk with Ellen Henri and RN about this, when they return to the office.  Pt also stated if [place] and Legrand Como needed some help with the forms, Dr Radford Pax is very familiar with filling these papers out.  Informed the pt that [place] and Legrand Como are out of the office and will be back mid next week.  Informed the pt that I will route this communication to them for further review and follow-up with the pt, upon return to the office next week. Pt verbalized understanding and agreeable to this plan.  Will also CC Dr Radford Pax and her RN on this as well.

## 2018-07-17 NOTE — Telephone Encounter (Signed)
  Patient is having an issue with her FMLA and would like to discuss with [place]. She said she previously had intermittent leave and since this second request was done she is now being told she does not have any intermittent leave and she does not understand what has changed.

## 2018-07-19 NOTE — Telephone Encounter (Signed)
Brittainy can you address this - I'm not sure what she is talking about

## 2018-07-21 NOTE — Telephone Encounter (Signed)
I spoke to the patient who needs some assistance on how her paperwork has been completed on 5/21 (Thursday).  She will bring this to the office to be re-evaluated.

## 2018-07-30 ENCOUNTER — Telehealth: Payer: Self-pay

## 2018-07-30 MED ORDER — METOPROLOL TARTRATE 25 MG PO TABS: 12.5000 mg | ORAL_TABLET | Freq: Two times a day (BID) | ORAL | 3 refills | Status: AC

## 2018-07-30 NOTE — Telephone Encounter (Signed)
I spoke to the patient and reviewed her medication.  She verbalized understanding and was thankful for the call.

## 2018-08-03 ENCOUNTER — Telehealth: Payer: Self-pay | Admitting: Cardiology

## 2018-08-03 NOTE — Telephone Encounter (Signed)
I spoke to the patient and informed her that I spoke to Dr Radford Pax last Friday and discussed FMLA paperwork.  Dr Radford Pax said that no changes were going to be made and that it had been completed.  The patient is coming by to pick them up.

## 2018-08-03 NOTE — Telephone Encounter (Signed)
Patient is calling to check on paperwork regarding her FMLA. She states that Legrand Como was [place] check on it last week for her but she has not heard anything. She would like to pick up her papers.

## 2018-08-03 NOTE — Telephone Encounter (Signed)
Patient returned your call please call back.

## 2018-08-03 NOTE — Telephone Encounter (Signed)
lpmtcb 6/8 

## 2018-08-26 ENCOUNTER — Encounter: Payer: Self-pay | Admitting: Registered Nurse

## 2018-08-28 ENCOUNTER — Ambulatory Visit: Payer: BLUE CROSS/BLUE SHIELD | Admitting: Registered Nurse

## 2018-09-03 ENCOUNTER — Other Ambulatory Visit: Payer: Self-pay | Admitting: Cardiology

## 2018-09-07 ENCOUNTER — Encounter: Payer: BLUE CROSS/BLUE SHIELD | Admitting: Emergency Medicine

## 2018-09-15 ENCOUNTER — Encounter: Payer: BC Managed Care – PPO | Admitting: Registered Nurse

## 2018-09-17 ENCOUNTER — Encounter: Payer: Self-pay | Admitting: Registered Nurse

## 2018-09-23 ENCOUNTER — Other Ambulatory Visit: Payer: Self-pay

## 2018-09-23 ENCOUNTER — Encounter (HOSPITAL_COMMUNITY): Payer: Self-pay | Admitting: *Deleted

## 2018-09-23 ENCOUNTER — Emergency Department (HOSPITAL_COMMUNITY)
Admission: EM | Admit: 2018-09-23 | Discharge: 2018-09-23 | Disposition: A | Discharge status: Home / Self Care | Payer: BC Managed Care – PPO | Attending: Emergency Medicine | Admitting: Emergency Medicine

## 2018-09-23 DIAGNOSIS — Z79899 Other long term (current) drug therapy: Secondary | ICD-10-CM | POA: Insufficient documentation

## 2018-09-23 DIAGNOSIS — Z720 Tobacco use: Secondary | ICD-10-CM | POA: Diagnosis not present

## 2018-09-23 DIAGNOSIS — I2510 Atherosclerotic heart disease of native coronary artery without angina pectoris: Secondary | ICD-10-CM | POA: Insufficient documentation

## 2018-09-23 DIAGNOSIS — Z7901 Long term (current) use of anticoagulants: Secondary | ICD-10-CM | POA: Insufficient documentation

## 2018-09-23 DIAGNOSIS — I252 Old myocardial infarction: Secondary | ICD-10-CM | POA: Diagnosis not present

## 2018-09-23 DIAGNOSIS — Z7982 Long term (current) use of aspirin: Secondary | ICD-10-CM | POA: Insufficient documentation

## 2018-09-23 DIAGNOSIS — R51 Headache: Secondary | ICD-10-CM | POA: Diagnosis not present

## 2018-09-23 DIAGNOSIS — R519 Headache, unspecified: Secondary | ICD-10-CM

## 2018-09-23 DIAGNOSIS — R000 Tachycardia, unspecified: Secondary | ICD-10-CM | POA: Diagnosis not present

## 2018-09-23 LAB — BASIC METABOLIC PANEL
Anion gap: 9 (ref 5–15)
BUN: 9 mg/dL (ref 6–20)
CO2: 21 mmol/L — ABNORMAL LOW (ref 22–32)
Calcium: 9 mg/dL (ref 8.9–10.3)
Chloride: 109 mmol/L (ref 98–111)
Creatinine, Ser: 0.94 mg/dL (ref 0.44–1.00)
GFR calc Af Amer: >60 mL/min (ref >60)
GFR calc non Af Amer: >60 mL/min (ref >60)
Glucose, Bld: 140 mg/dL — ABNORMAL HIGH (ref 70–99)
Potassium: 3.4 mmol/L — ABNORMAL LOW (ref 3.5–5.1)
Sodium: 139 mmol/L (ref 135–145)

## 2018-09-23 LAB — CBC
HCT: 44.4 % (ref 36.0–46.0)
Hemoglobin: 14.4 g/dL (ref 12.0–15.0)
MCH: 28 pg (ref 26.0–34.0)
MCHC: 32.4 g/dL (ref 30.0–36.0)
MCV: 86.4 fL (ref 80.0–100.0)
Platelets: 219 10*3/uL (ref 150–400)
RBC: 5.14 MIL/uL — ABNORMAL HIGH (ref 3.87–5.11)
RDW: 15.7 % — ABNORMAL HIGH (ref 11.5–15.5)
WBC: 8.9 10*3/uL (ref 4.0–10.5)
nRBC: 0 % (ref 0.0–0.2)

## 2018-09-23 LAB — I-STAT BETA HCG BLOOD, ED (MC, WL, AP ONLY): I-stat hCG, quantitative: 7.8 m[IU]/mL — ABNORMAL HIGH (ref <5)

## 2018-09-23 LAB — CBG MONITORING, ED: Glucose-Capillary: 121 mg/dL — ABNORMAL HIGH (ref 70–99)

## 2018-09-23 MED ORDER — PROCHLORPERAZINE EDISYLATE 10 MG/2ML IJ SOLN: 10.0000 mg | Freq: Once | INTRAMUSCULAR | Status: DC

## 2018-09-23 MED ORDER — SODIUM CHLORIDE 0.9 % IV BOLUS: 1000.0000 mL | Freq: Once | INTRAVENOUS | Status: DC

## 2018-09-23 MED ORDER — KETOROLAC TROMETHAMINE 30 MG/ML IJ SOLN: 30.0000 mg | Freq: Once | INTRAMUSCULAR | Status: DC

## 2018-09-23 MED ORDER — SODIUM CHLORIDE 0.9% FLUSH: 3.0000 mL | Freq: Once | INTRAVENOUS | Status: DC

## 2018-09-23 NOTE — ED Triage Notes (Signed)
Pt reports onset last night of feeling unsteady, feels like her"eyes are off", has headache and sob. No distress is noted at triage and no neuro deficits are noted.

## 2018-09-23 NOTE — ED Notes (Signed)
Patient verbalizes understanding of discharge instructions. Opportunity for questioning and answers were provided. Armband removed by staff, pt discharged from ED.  
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
## 2018-10-01 ENCOUNTER — Emergency Department: Payer: BC Managed Care – PPO

## 2018-10-01 ENCOUNTER — Encounter: Payer: Self-pay | Admitting: Emergency Medicine

## 2018-10-01 ENCOUNTER — Emergency Department
Admission: EM | Admit: 2018-10-01 | Discharge: 2018-10-01 | Disposition: A | Discharge status: Home / Self Care | Payer: BC Managed Care – PPO | Attending: Emergency Medicine | Admitting: Emergency Medicine

## 2018-10-01 ENCOUNTER — Other Ambulatory Visit: Payer: Self-pay

## 2018-10-01 DIAGNOSIS — I2510 Atherosclerotic heart disease of native coronary artery without angina pectoris: Secondary | ICD-10-CM | POA: Diagnosis not present

## 2018-10-01 DIAGNOSIS — Z7902 Long term (current) use of antithrombotics/antiplatelets: Secondary | ICD-10-CM | POA: Diagnosis not present

## 2018-10-01 DIAGNOSIS — Y999 Unspecified external cause status: Secondary | ICD-10-CM | POA: Insufficient documentation

## 2018-10-01 DIAGNOSIS — Y9301 Activity, walking, marching and hiking: Secondary | ICD-10-CM | POA: Insufficient documentation

## 2018-10-01 DIAGNOSIS — Z955 Presence of coronary angioplasty implant and graft: Secondary | ICD-10-CM | POA: Diagnosis not present

## 2018-10-01 DIAGNOSIS — F17210 Nicotine dependence, cigarettes, uncomplicated: Secondary | ICD-10-CM | POA: Diagnosis not present

## 2018-10-01 DIAGNOSIS — R600 Localized edema: Secondary | ICD-10-CM | POA: Insufficient documentation

## 2018-10-01 DIAGNOSIS — M25462 Effusion, left knee: Secondary | ICD-10-CM | POA: Diagnosis not present

## 2018-10-01 DIAGNOSIS — Z79899 Other long term (current) drug therapy: Secondary | ICD-10-CM | POA: Insufficient documentation

## 2018-10-01 DIAGNOSIS — X509XXA Other and unspecified overexertion or strenuous movements or postures, initial encounter: Secondary | ICD-10-CM | POA: Insufficient documentation

## 2018-10-01 DIAGNOSIS — Y92007 Garden or yard of unspecified non-institutional (private) residence as the place of occurrence of the external cause: Secondary | ICD-10-CM | POA: Insufficient documentation

## 2018-10-01 DIAGNOSIS — Z7982 Long term (current) use of aspirin: Secondary | ICD-10-CM | POA: Diagnosis not present

## 2018-10-01 DIAGNOSIS — M25562 Pain in left knee: Secondary | ICD-10-CM | POA: Insufficient documentation

## 2018-10-01 MED ORDER — NAPROXEN 500 MG PO TABS: 500.0000 mg | ORAL_TABLET | Freq: Two times a day (BID) | ORAL | 0 refills | Status: DC

## 2018-10-01 MED ORDER — HYDROCODONE-ACETAMINOPHEN 5-325 MG PO TABS: 1.0000 | ORAL_TABLET | Freq: Four times a day (QID) | ORAL | 0 refills | Status: DC | PRN

## 2018-10-01 NOTE — ED Triage Notes (Signed)
Says she has been having left knee pain for several days.  No injury. Then last night was walking across lawn and felt some thing "break" in it.  She cannot straighten it completely. Good circulation distallyt

## 2018-10-01 NOTE — Discharge Instructions (Addendum)
Follow-up with your primary care provider or Dr. Sabra Heck who is the orthopedist on call if not improving or continued problems.  Ice and elevation.  Wear knee immobilizer when you are up walking.  Begin taking the naproxen 500 mg twice daily with food for inflammation pain.  Also the Norco is every 6 hours as needed for moderate pain.  Do not take the Norco if you plan on driving or operating machinery as this could cause increase risk for further injury.
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
## 2018-11-15 DIAGNOSIS — M25562 Pain in left knee: Secondary | ICD-10-CM | POA: Diagnosis not present

## 2018-12-11 DIAGNOSIS — M1712 Unilateral primary osteoarthritis, left knee: Secondary | ICD-10-CM | POA: Diagnosis not present

## 2018-12-18 ENCOUNTER — Other Ambulatory Visit: Payer: Self-pay

## 2018-12-18 ENCOUNTER — Other Ambulatory Visit: Payer: Self-pay | Admitting: Physician Assistant

## 2018-12-18 ENCOUNTER — Encounter: Payer: Self-pay | Admitting: Physician Assistant

## 2018-12-18 ENCOUNTER — Ambulatory Visit (INDEPENDENT_AMBULATORY_CARE_PROVIDER_SITE_OTHER): Payer: BC Managed Care – PPO | Admitting: Physician Assistant

## 2018-12-18 VITALS — BP 96/67 | HR 88 | Temp 97.3°F | Resp 16 | Ht 61.0 in | Wt 193.8 lb

## 2018-12-18 DIAGNOSIS — R739 Hyperglycemia, unspecified: Secondary | ICD-10-CM

## 2018-12-18 DIAGNOSIS — Z789 Other specified health status: Secondary | ICD-10-CM

## 2018-12-18 DIAGNOSIS — G43809 Other migraine, not intractable, without status migrainosus: Secondary | ICD-10-CM | POA: Diagnosis not present

## 2018-12-18 DIAGNOSIS — I214 Non-ST elevation (NSTEMI) myocardial infarction: Secondary | ICD-10-CM

## 2018-12-18 DIAGNOSIS — Q828 Other specified congenital malformations of skin: Secondary | ICD-10-CM

## 2018-12-18 DIAGNOSIS — E669 Obesity, unspecified: Secondary | ICD-10-CM

## 2018-12-18 DIAGNOSIS — Z1211 Encounter for screening for malignant neoplasm of colon: Secondary | ICD-10-CM

## 2018-12-18 DIAGNOSIS — D229 Melanocytic nevi, unspecified: Secondary | ICD-10-CM

## 2018-12-18 MED ORDER — SUMATRIPTAN 5 MG/ACT NA SOLN: 1.0000 | NASAL | 0 refills | Status: DC | PRN

## 2018-12-18 MED ORDER — TOPIRAMATE 25 MG PO TABS: ORAL_TABLET | ORAL | 1 refills | Status: AC

## 2018-12-18 NOTE — Patient Instructions (Signed)
° °Calorie Counting for Weight Loss °Calories are units of energy. Your body needs a certain amount of calories from food to keep you going throughout the day. When you eat more calories than your body needs, your body stores the extra calories as fat. When you eat fewer calories than your body needs, your body burns fat to get the energy it needs. °Calorie counting means keeping track of how many calories you eat and drink each day. Calorie counting can be helpful if you need to lose weight. If you make sure to eat fewer calories than your body needs, you should lose weight. Ask your health care provider what a healthy weight is for you. °For calorie counting to work, you will need to eat the right number of calories in a day in order to lose a healthy amount of weight per week. A dietitian can help you determine how many calories you need in a day and will give you suggestions on how to reach your calorie goal. °· A healthy amount of weight to lose per week is usually 1-2 lb (0.5-0.9 kg). This usually means that your daily calorie intake should be reduced by 500-750 calories. °· Eating 1,200 - 1,500 calories per day can help most women lose weight. °· Eating 1,500 - 1,800 calories per day can help most men lose weight. °What is my plan? °My goal is to have __________ calories per day. °If I have this many calories per day, I should lose around __________ pounds per week. °What do I need to know about calorie counting? °In order to meet your daily calorie goal, you will need to: °· Find out how many calories are in each food you would like to eat. Try to do this before you eat. °· Decide how much of the food you plan to eat. °· Write down what you ate and how many calories it had. Doing this is called keeping a food log. °To successfully lose weight, it is important to balance calorie counting with a healthy lifestyle that includes regular activity. Aim for 150 minutes of moderate exercise (such as walking) or 75  minutes of vigorous exercise (such as running) each week. °Where do I find calorie information? ° °The number of calories in a food can be found on a Nutrition Facts label. If a food does not have a Nutrition Facts label, try to look up the calories online or ask your dietitian for help. °Remember that calories are listed per serving. If you choose to have more than one serving of a food, you will have to multiply the calories per serving by the amount of servings you plan to eat. For example, the label on a package of bread might say that a serving size is 1 slice and that there are 90 calories in a serving. If you eat 1 slice, you will have eaten 90 calories. If you eat 2 slices, you will have eaten 180 calories. °How do I keep a food log? °Immediately after each meal, record the following information in your food log: °· What you ate. Don't forget to include toppings, sauces, and other extras on the food. °· How much you ate. This can be measured in cups, ounces, or number of items. °· How many calories each food and drink had. °· The total number of calories in the meal. °Keep your food log near you, such as in a small notebook in your pocket, or use a mobile app or website. Some programs will   calculate calories for you and show you how many calories you have left for the day to meet your goal. °What are some calorie counting tips? ° °· Use your calories on foods and drinks that will fill you up and not leave you hungry: °? Some examples of foods that fill you up are nuts and nut butters, vegetables, lean proteins, and high-fiber foods like whole grains. High-fiber foods are foods with more than 5 g fiber per serving. °? Drinks such as sodas, specialty coffee drinks, alcohol, and juices have a lot of calories, yet do not fill you up. °· Eat nutritious foods and avoid empty calories. Empty calories are calories you get from foods or beverages that do not have many vitamins or protein, such as candy, sweets, and  soda. It is better to have a nutritious high-calorie food (such as an avocado) than a food with few nutrients (such as a bag of chips). °· Know how many calories are in the foods you eat most often. This will help you calculate calorie counts faster. °· Pay attention to calories in drinks. Low-calorie drinks include water and unsweetened drinks. °· Pay attention to nutrition labels for "low fat" or "fat free" foods. These foods sometimes have the same amount of calories or more calories than the full fat versions. They also often have added sugar, starch, or salt, to make up for flavor that was removed with the fat. °· Find a way of tracking calories that works for you. Get creative. Try different apps or programs if writing down calories does not work for you. °What are some portion control tips? °· Know how many calories are in a serving. This will help you know how many servings of a certain food you can have. °· Use a measuring cup to measure serving sizes. You could also try weighing out portions on a kitchen scale. With time, you will be able to estimate serving sizes for some foods. °· Take some time to put servings of different foods on your favorite plates, bowls, and cups so you know what a serving looks like. °· Try not to eat straight from a bag or box. Doing this can lead to overeating. Put the amount you would like to eat in a cup or on a plate to make sure you are eating the right portion. °· Use smaller plates, glasses, and bowls to prevent overeating. °· Try not to multitask (for example, watch TV or use your computer) while eating. If it is time to eat, sit down at a table and enjoy your food. This will help you to know when you are full. It will also help you to be aware of what you are eating and how much you are eating. °What are tips for following this plan? °Reading food labels °· Check the calorie count compared to the serving size. The serving size may be smaller than what you are used to  eating. °· Check the source of the calories. Make sure the food you are eating is high in vitamins and protein and low in saturated and trans fats. °Shopping °· Read nutrition labels while you shop. This will help you make healthy decisions before you decide to purchase your food. °· Make a grocery list and stick to it. °Cooking °· Try to cook your favorite foods in a healthier way. For example, try baking instead of frying. °· Use low-fat dairy products. °Meal planning °· Use more fruits and vegetables. Half of your plate should be   fruits and vegetables. °· Include lean proteins like poultry and fish. °How do I count calories when eating out? °· Ask for smaller portion sizes. °· Consider sharing an entree and sides instead of getting your own entree. °· If you get your own entree, eat only half. Ask for a box at the beginning of your meal and put the rest of your entree in it so you are not tempted to eat it. °· If calories are listed on the menu, choose the lower calorie options. °· Choose dishes that include vegetables, fruits, whole grains, low-fat dairy products, and lean protein. °· Choose items that are boiled, broiled, grilled, or steamed. Stay away from items that are buttered, battered, fried, or served with cream sauce. Items labeled "crispy" are usually fried, unless stated otherwise. °· Choose water, low-fat milk, unsweetened iced tea, or other drinks without added sugar. If you want an alcoholic beverage, choose a lower calorie option such as a glass of wine or light beer. °· Ask for dressings, sauces, and syrups on the side. These are usually high in calories, so you should limit the amount you eat. °· If you want a salad, choose a garden salad and ask for grilled meats. Avoid extra toppings like bacon, cheese, or fried items. Ask for the dressing on the side, or ask for olive oil and vinegar or lemon to use as dressing. °· Estimate how many servings of a food you are given. For example, a serving of  cooked rice is ½ cup or about the size of half a baseball. Knowing serving sizes will help you be aware of how much food you are eating at restaurants. The list below tells you how big or small some common portion sizes are based on everyday objects: °? 1 oz--4 stacked dice. °? 3 oz--1 deck of cards. °? 1 tsp--1 die. °? 1 Tbsp--½ a ping-pong ball. °? 2 Tbsp--1 ping-pong ball. °? ½ cup--½ baseball. °? 1 cup--1 baseball. °Summary °· Calorie counting means keeping track of how many calories you eat and drink each day. If you eat fewer calories than your body needs, you should lose weight. °· A healthy amount of weight to lose per week is usually 1-2 lb (0.5-0.9 kg). This usually means reducing your daily calorie intake by 500-750 calories. °· The number of calories in a food can be found on a Nutrition Facts label. If a food does not have a Nutrition Facts label, try to look up the calories online or ask your dietitian for help. °· Use your calories on foods and drinks that will fill you up, and not on foods and drinks that will leave you hungry. °· Use smaller plates, glasses, and bowls to prevent overeating. °This information is not intended to replace advice given to you by your health care provider. Make sure you discuss any questions you have with your health care provider. °Document Released: 02/11/2005 Document Revised: 10/31/2017 Document Reviewed: 01/12/2016 °Elsevier Patient Education © 2020 [employer] ° °
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
## 2018-12-19 LAB — COMPREHENSIVE METABOLIC PANEL
ALT: 20 IU/L (ref 0–32)
AST: 16 IU/L (ref 0–40)
Albumin/Globulin Ratio: 1.6 (ref 1.2–2.2)
Albumin: 4.1 g/dL (ref 3.8–4.8)
Alkaline Phosphatase: 102 IU/L (ref 39–117)
BUN/Creatinine Ratio: 11 (ref 9–23)
BUN: 11 mg/dL (ref 6–24)
Bilirubin Total: 0.4 mg/dL (ref 0.0–1.2)
CO2: 22 mmol/L (ref 20–29)
Calcium: 9.2 mg/dL (ref 8.7–10.2)
Chloride: 103 mmol/L (ref 96–106)
Creatinine, Ser: 1.03 mg/dL — ABNORMAL HIGH (ref 0.57–1.00)
GFR calc Af Amer: 73 mL/min/{1.73_m2} (ref >59)
GFR calc non Af Amer: 64 mL/min/{1.73_m2} (ref >59)
Globulin, Total: 2.5 g/dL (ref 1.5–4.5)
Glucose: 95 mg/dL (ref 65–99)
Potassium: 3.8 mmol/L (ref 3.5–5.2)
Sodium: 140 mmol/L (ref 134–144)
Total Protein: 6.6 g/dL (ref 6.0–8.5)

## 2018-12-19 LAB — LIPID PANEL
Chol/HDL Ratio: 8 ratio — ABNORMAL HIGH (ref 0.0–4.4)
Cholesterol, Total: 225 mg/dL — ABNORMAL HIGH (ref 100–199)
HDL: 28 mg/dL — ABNORMAL LOW (ref >39)
LDL Chol Calc (NIH): 100 mg/dL — ABNORMAL HIGH (ref 0–99)
Triglycerides: 572 mg/dL (ref 0–149)
VLDL Cholesterol Cal: 97 mg/dL — ABNORMAL HIGH (ref 5–40)

## 2018-12-19 LAB — CBC WITH DIFFERENTIAL/PLATELET
Basophils Absolute: 0.1 10*3/uL (ref 0.0–0.2)
Basos: 1 %
EOS (ABSOLUTE): 0.3 10*3/uL (ref 0.0–0.4)
Eos: 3 %
Hematocrit: 42.4 % (ref 34.0–46.6)
Hemoglobin: 14.4 g/dL (ref 11.1–15.9)
Immature Grans (Abs): 0.1 10*3/uL (ref 0.0–0.1)
Immature Granulocytes: 1 %
Lymphocytes Absolute: 3.6 10*3/uL — ABNORMAL HIGH (ref 0.7–3.1)
Lymphs: 34 %
MCH: 28.3 pg (ref 26.6–33.0)
MCHC: 34 g/dL (ref 31.5–35.7)
MCV: 84 fL (ref 79–97)
Monocytes Absolute: 0.9 10*3/uL (ref 0.1–0.9)
Monocytes: 9 %
Neutrophils Absolute: 5.6 10*3/uL (ref 1.4–7.0)
Neutrophils: 52 %
Platelets: 206 10*3/uL (ref 150–450)
RBC: 5.08 x10E6/uL (ref 3.77–5.28)
RDW: 14.7 % (ref 11.7–15.4)
WBC: 10.5 10*3/uL (ref 3.4–10.8)

## 2018-12-19 LAB — TSH: TSH: 1.86 u[IU]/mL (ref 0.450–4.500)

## 2018-12-19 LAB — HEMOGLOBIN A1C
Est. average glucose Bld gHb Est-mCnc: 128 mg/dL
Hgb A1c MFr Bld: 6.1 % — ABNORMAL HIGH (ref 4.8–5.6)

## 2018-12-19 LAB — LDL CHOLESTEROL, DIRECT: LDL Direct: 114 mg/dL — ABNORMAL HIGH (ref 0–99)

## 2018-12-22 ENCOUNTER — Telehealth: Payer: Self-pay

## 2018-12-22 DIAGNOSIS — M25562 Pain in left knee: Secondary | ICD-10-CM | POA: Diagnosis not present

## 2018-12-22 NOTE — Telephone Encounter (Signed)
Patient returned call and was advised of labs. Patient states that she has cut back on her soda intake to 1.5 glass a day and has started drinking more water.FYI

## 2018-12-22 NOTE — Telephone Encounter (Signed)
-----   Message from Trinna Post, [place] sent at 12/22/2018  8:28 AM EDT ----- Sugars have come down from diabetic range to prediabetic range but will certainly increase back to diabetes if she continues her current diet. Cholesterol is too high for somebody who had a heart attack and we will need to adjust her medication at next visit. The remainder of her labwork is normal.

## 2018-12-22 NOTE — Telephone Encounter (Signed)
LVMTRC 

## 2019-01-08 DIAGNOSIS — M1712 Unilateral primary osteoarthritis, left knee: Secondary | ICD-10-CM | POA: Diagnosis not present

## 2019-01-14 DIAGNOSIS — M1712 Unilateral primary osteoarthritis, left knee: Secondary | ICD-10-CM | POA: Diagnosis not present

## 2019-01-15 ENCOUNTER — Encounter: Payer: Self-pay | Admitting: Physician Assistant

## 2019-01-15 ENCOUNTER — Telehealth: Payer: Self-pay | Admitting: *Deleted

## 2019-01-15 ENCOUNTER — Other Ambulatory Visit: Payer: Self-pay

## 2019-01-15 ENCOUNTER — Ambulatory Visit (INDEPENDENT_AMBULATORY_CARE_PROVIDER_SITE_OTHER): Payer: BC Managed Care – PPO | Admitting: Physician Assistant

## 2019-01-15 VITALS — BP 106/80 | HR 91 | Temp 96.8°F | Wt 191.2 lb

## 2019-01-15 DIAGNOSIS — R7303 Prediabetes: Secondary | ICD-10-CM

## 2019-01-15 DIAGNOSIS — R519 Headache, unspecified: Secondary | ICD-10-CM

## 2019-01-15 DIAGNOSIS — E6609 Other obesity due to excess calories: Secondary | ICD-10-CM | POA: Diagnosis not present

## 2019-01-15 DIAGNOSIS — E7800 Pure hypercholesterolemia, unspecified: Secondary | ICD-10-CM | POA: Diagnosis not present

## 2019-01-15 DIAGNOSIS — K219 Gastro-esophageal reflux disease without esophagitis: Secondary | ICD-10-CM | POA: Diagnosis not present

## 2019-01-15 DIAGNOSIS — Z6834 Body mass index (BMI) 34.0-34.9, adult: Secondary | ICD-10-CM

## 2019-01-15 DIAGNOSIS — I214 Non-ST elevation (NSTEMI) myocardial infarction: Secondary | ICD-10-CM

## 2019-01-15 DIAGNOSIS — Z1211 Encounter for screening for malignant neoplasm of colon: Secondary | ICD-10-CM | POA: Diagnosis not present

## 2019-01-15 DIAGNOSIS — R142 Eructation: Secondary | ICD-10-CM | POA: Diagnosis not present

## 2019-01-15 NOTE — Telephone Encounter (Signed)
   Primary Cardiologist:Traci Turner, MD  Chart reviewed as part of pre-operative protocol coverage. Because of Lyn Komm past medical history and time since last visit, he/she will require a follow-up visit in order to better assess preoperative cardiovascular risk.  Pre-op covering staff: - Please schedule appointment and call patient to inform them. - Please contact requesting surgeon's office via preferred method (i.e, phone, fax) to inform them of need for appointment prior to surgery.  If applicable, this message will also be routed to pharmacy pool and/or primary cardiologist for input on holding anticoagulant/antiplatelet agent as requested below so that this information is available at time of patient's appointment.   [place], [place]  01/15/2019, 6:58 PM

## 2019-01-15 NOTE — Patient Instructions (Addendum)
New referral to cardiology And take lipitor in the morning   Prediabetes Eating Plan Prediabetes is a condition that causes blood sugar (glucose) levels to be higher than normal. This increases the risk for developing diabetes. In order to prevent diabetes from developing, your health care provider may recommend a diet and other lifestyle changes to help you:  Control your blood glucose levels.  Improve your cholesterol levels.  Manage your blood pressure. Your health care provider may recommend working with a diet and nutrition specialist (dietitian) to make a meal plan that is best for you. What are tips for following this plan? Lifestyle  Set weight loss goals with the help of your health care team. It is recommended that most people with prediabetes lose 7% of their current body weight.  Exercise for at least 30 minutes at least 5 days a week.  Attend a support group or seek ongoing support from a mental health counselor.  Take over-the-counter and prescription medicines only as told by your health care provider. Reading food labels  Read food labels to check the amount of fat, salt (sodium), and sugar in prepackaged foods. Avoid foods that have: ? Saturated fats. ? Trans fats. ? Added sugars.  Avoid foods that have more than 300 milligrams (mg) of sodium per serving. Limit your daily sodium intake to less than 2,300 mg each day. Shopping  Avoid buying pre-made and processed foods. Cooking  Cook with olive oil. Do not use butter, lard, or ghee.  Bake, broil, grill, or boil foods. Avoid frying. Meal planning   Work with your dietitian to develop an eating plan that is right for you. This may include: ? Tracking how many calories you take in. Use a food diary, notebook, or mobile application to track what you eat at each meal. ? Using the glycemic index (GI) to plan your meals. The index tells you how quickly a food will raise your blood glucose. Choose low-GI foods.  These foods take a longer time to raise blood glucose.  Consider following a Mediterranean diet. This diet includes: ? Several servings each day of fresh fruits and vegetables. ? Eating fish at least twice a week. ? Several servings each day of whole grains, beans, nuts, and seeds. ? Using olive oil instead of other fats. ? Moderate alcohol consumption. ? Eating small amounts of red meat and whole-fat dairy.  If you have high blood pressure, you may need to limit your sodium intake or follow a diet such as the DASH eating plan. DASH is an eating plan that aims to lower high blood pressure. What foods are recommended? The items listed below may not be a complete list. Talk with your dietitian about what dietary choices are best for you. Grains Whole grains, such as whole-wheat or whole-grain breads, crackers, cereals, and pasta. Unsweetened oatmeal. Bulgur. Barley. Quinoa. Brown rice. Corn or whole-wheat flour tortillas or taco shells. Vegetables Lettuce. Spinach. Peas. Beets. Cauliflower. Cabbage. Broccoli. Carrots. Tomatoes. Squash. Eggplant. Herbs. Peppers. Onions. Cucumbers. Brussels sprouts. Fruits Berries. Bananas. Apples. Oranges. Grapes. Papaya. Mango. Pomegranate. Kiwi. Grapefruit. Cherries. Meats and other protein foods Seafood. Poultry without skin. Lean cuts of pork and beef. Tofu. Eggs. Nuts. Beans. Dairy Low-fat or fat-free dairy products, such as yogurt, cottage cheese, and cheese. Beverages Water. Tea. Coffee. Sugar-free or diet soda. Seltzer water. Lowfat or no-fat milk. Milk alternatives, such as soy or almond milk. Fats and oils Olive oil. Canola oil. Sunflower oil. Grapeseed oil. Avocado. Walnuts. Sweets and desserts Sugar-free  or low-fat pudding. Sugar-free or low-fat ice cream and other frozen treats. Seasoning and other foods Herbs. Sodium-free spices. Mustard. Relish. Low-fat, low-sugar ketchup. Low-fat, low-sugar barbecue sauce. Low-fat or fat-free mayonnaise.  What foods are not recommended? The items listed below may not be a complete list. Talk with your dietitian about what dietary choices are best for you. Grains Refined white flour and flour products, such as bread, pasta, snack foods, and cereals. Vegetables Canned vegetables. Frozen vegetables with butter or cream sauce. Fruits Fruits canned with syrup. Meats and other protein foods Fatty cuts of meat. Poultry with skin. Breaded or fried meat. Processed meats. Dairy Full-fat yogurt, cheese, or milk. Beverages Sweetened drinks, such as sweet iced tea and soda. Fats and oils Butter. Lard. Ghee. Sweets and desserts Baked goods, such as cake, cupcakes, pastries, cookies, and cheesecake. Seasoning and other foods Spice mixes with added salt. Ketchup. Barbecue sauce. Mayonnaise. Summary  To prevent diabetes from developing, you may need to make diet and other lifestyle changes to help control blood sugar, improve cholesterol levels, and manage your blood pressure.  Set weight loss goals with the help of your health care team. It is recommended that most people with prediabetes lose 7 percent of their current body weight.  Consider following a Mediterranean diet that includes plenty of fresh fruits and vegetables, whole grains, beans, nuts, seeds, fish, lean meat, low-fat dairy, and healthy oils. This information is not intended to replace advice given to you by your health care provider. Make sure you discuss any questions you have with your health care provider. Document Released: 06/28/2014 Document Revised: 06/05/2018 Document Reviewed: 04/17/2016 Elsevier Patient Education  2020 [employer].

## 2019-01-15 NOTE — Progress Notes (Signed)
Patient: Rhonda Brown Female    DOB: [birth year]   53 y.o.   MRN: [id number] Visit Date: 01/15/2019  Today's Provider: Trinna Post, PA-C   Chief Complaint  Patient presents with  . Weight Loss   Subjective:     HPI  Weight Loss Patient presents today for weight loss. Patient last office visit was on 12/18/2018. Patient was taking Topamax but states she stop due to the side effects. Patient states that she has cut back on soda intake.  Previously drinking 2L of soda daily and is now drinking 0.75 to 0.5 L of soda daily.   Wt Readings from Last 3 Encounters:  01/15/19 191 lb 3.2 oz (86.7 kg)  12/18/18 193 lb 12.8 oz (87.9 kg)  06/15/18 196 lb (88.9 kg)   Obesity: Did not start topamax because she was nervous about topamax. Had a previous bad experience with topamax. Reports she is trying to make dietary changes by reducing soda and eating healthier foods.    GI -she is following up with her old GI doctor in [place] 2/2 availability regarding gastroparesis and colonoscopy.    NTEMI: Had been referred by our office last time but patient was confused about which location to attend. Will refer again.   HLD: misses lipitor 80 mg about 5 nights a week.  Lipid Panel     Component Value Date/Time   CHOL 225 (H) 12/18/2018 1546   TRIG 572 (HH) 12/18/2018 1546   HDL 28 (L) 12/18/2018 1546   CHOLHDL 8.0 (H) 12/18/2018 1546   CHOLHDL 8.3 03/25/2018 0020   VLDL 47 (H) 03/25/2018 0020   LDLCALC 100 (H) 12/18/2018 1546   LDLDIRECT 114 (H) 12/18/2018 1546   LABVLDL 97 (H) 12/18/2018 1546     BP Readings from Last 3 Encounters:  01/15/19 106/80  12/18/18 96/67  10/01/18 119/85   Lab Results  Component Value Date   HGBA1C 6.1 (H) 12/18/2018      Allergies  Allergen Reactions  . Cat Hair Extract     UNSPECIFIED REACTION      Current Outpatient Medications:  .  acetaminophen (TYLENOL) 325 MG tablet, Take 650 mg by mouth every 6 (six) hours  as needed for moderate pain. , Disp: , Rfl:  .  aspirin 81 MG tablet, Take 81 mg by mouth daily. , Disp: , Rfl:  .  atorvastatin (LIPITOR) 80 MG tablet, TAKE 1 TABLET BY MOUTH EVERYDAY AT BEDTIME, Disp: 90 tablet, Rfl: 2 .  cetirizine (ZYRTEC) 10 MG tablet, Patient take half tablet by mouth at bedtime, Disp: , Rfl:  .  clopidogrel (PLAVIX) 75 MG tablet, TAKE 1 TABLET BY MOUTH EVERY DAY, Disp: 90 tablet, Rfl: 3 .  erythromycin (E-MYCIN) 250 MG tablet, Take 500 mg by mouth 4 (four) times daily. , Disp: , Rfl: 0 .  isosorbide mononitrate (IMDUR) 30 MG 24 hr tablet, Take 1 tablet (30 mg total) by mouth daily., Disp: 90 tablet, Rfl: 3 .  metoprolol tartrate (LOPRESSOR) 25 MG tablet, Take 0.5 tablets (12.5 mg total) by mouth 2 (two) times daily., Disp: 180 tablet, Rfl: 3 .  naproxen (NAPROSYN) 500 MG tablet, Take 1 tablet (500 mg total) by mouth 2 (two) times daily with a meal. (Patient not taking: Reported on 12/18/2018), Disp: 30 tablet, Rfl: 0 .  nicotine (NICODERM CQ - DOSED IN MG/24 HOURS) 14 mg/24hr patch, Place 14 mg onto the skin daily as needed (nicotine cravings). , Disp: , Rfl:  .  nitroGLYCERIN (NITROSTAT) 0.4 MG SL tablet, Place 1 tablet (0.4 mg total) under the tongue every 5 (five) minutes as needed for chest pain., Disp: 75 tablet, Rfl: 2 .  ranolazine (RANEXA) 1000 MG SR tablet, Take 1 tablet (1,000 mg total) by mouth 2 (two) times daily., Disp: 90 each, Rfl: 3 .  topiramate (TOPAMAX) 25 MG tablet, Take 25 mg QHS x 1 wk. Then take 50 mg x 1 wk. Then 75 mg x 1wk. Then 100 mg x 1 wk., Disp: 180 tablet, Rfl: 1  Review of Systems  Constitutional: Negative.   Respiratory: Negative.   Genitourinary: Negative.   Hematological: Negative.     Social History   Tobacco Use  . Smoking status: Current Some Day Smoker    Types: Cigarettes  . Smokeless tobacco: Never Used  . Tobacco comment: smokes 2 cigarettes a day  Substance Use Topics  . Alcohol use: Yes    Comment: 10/03/2017, monthly       Objective:   There were no vitals taken for this visit. There were no vitals filed for this visit.There is no height or weight on file to calculate BMI.   Physical Exam Constitutional:      Appearance: Normal appearance.  Cardiovascular:     Rate and Rhythm: Normal rate and regular rhythm.     Heart sounds: Normal heart sounds.  Pulmonary:     Effort: Pulmonary effort is normal.     Breath sounds: Normal breath sounds.  Skin:    General: Skin is warm and dry.  Neurological:     Mental Status: She is alert and oriented to person, place, and time. Mental status is at baseline.  Psychiatric:        Mood and Affect: Mood normal.        Behavior: Behavior normal.      No results found for any visits on 01/15/19.     Assessment & Plan    1. Pure hypercholesterolemia  Suggest she take the lipitor 80 mg in the morning with her medications. Although it will work best at night, it would be better to take it consistently in the morning than miss it most days at night.  2. Class 1 obesity due to excess calories with serious comorbidity and body mass index (BMI) of 34.0 to 34.9 in adult  She has reduced soda consumption and is working on eating healthier foods. Two pounds down since last visit.  3. Frequent headaches  Did not ultimately take topamax due to fear of side effects and previous poor experience with Welbutrin. Her headaches might decrease as she reduces caffeine consumption.  4. Prediabetes  Follow up for check in 6 months as she continues with dietary changes.  5. NSTEMI (non-ST elevated myocardial infarction) [facility])  New referral placed for cardiology today .  - Ambulatory referral to Cardiology       Trinna Post, PA-C  [facility]

## 2019-01-15 NOTE — Telephone Encounter (Signed)
   [facility] Pre-operative Risk Assessment    Request for surgical clearance:  1. What type of surgery is being performed? SCREENING COLONOSCOPY   2. When is this surgery scheduled? TBD   3. What type of clearance is required (medical clearance vs. Pharmacy clearance to hold med vs. Both)? MEDICAL  4. Are there any medications that need to be held prior to surgery and how long? PLAVIX X 5 DAYS PRIOR.  DR. Therisa Doyne IS ASKING IF PLAVIX SHOULD BE STOPPED AFTER THE PROCEDURE IF POLYPS ARE REMOVED?   5. Practice name and name of physician performing surgery? [facility]; DR. Therisa Doyne  6. What is your office phone number [phone]    7.   What is your office fax number [phone]  8.   Anesthesia type (None, local, MAC, general) ? NOT LISTED   Julaine Hua 01/15/2019, 5:07 PM  _________________________________________________________________   (provider comments below)

## 2019-01-17 ENCOUNTER — Encounter: Payer: Self-pay | Admitting: Emergency Medicine

## 2019-01-17 ENCOUNTER — Emergency Department
Admission: EM | Admit: 2019-01-17 | Discharge: 2019-01-17 | Disposition: A | Discharge status: Home / Self Care | Payer: BC Managed Care – PPO | Attending: Emergency Medicine | Admitting: Emergency Medicine

## 2019-01-17 ENCOUNTER — Other Ambulatory Visit: Payer: Self-pay

## 2019-01-17 ENCOUNTER — Emergency Department: Payer: BC Managed Care – PPO

## 2019-01-17 DIAGNOSIS — Z79899 Other long term (current) drug therapy: Secondary | ICD-10-CM | POA: Diagnosis not present

## 2019-01-17 DIAGNOSIS — F17210 Nicotine dependence, cigarettes, uncomplicated: Secondary | ICD-10-CM | POA: Insufficient documentation

## 2019-01-17 DIAGNOSIS — Z7982 Long term (current) use of aspirin: Secondary | ICD-10-CM | POA: Diagnosis not present

## 2019-01-17 DIAGNOSIS — R0602 Shortness of breath: Secondary | ICD-10-CM | POA: Diagnosis not present

## 2019-01-17 DIAGNOSIS — J984 Other disorders of lung: Secondary | ICD-10-CM | POA: Diagnosis not present

## 2019-01-17 DIAGNOSIS — M546 Pain in thoracic spine: Secondary | ICD-10-CM | POA: Diagnosis not present

## 2019-01-17 DIAGNOSIS — I2510 Atherosclerotic heart disease of native coronary artery without angina pectoris: Secondary | ICD-10-CM | POA: Insufficient documentation

## 2019-01-17 DIAGNOSIS — R079 Chest pain, unspecified: Secondary | ICD-10-CM | POA: Diagnosis not present

## 2019-01-17 LAB — COMPREHENSIVE METABOLIC PANEL
ALT: 17 U/L (ref 0–44)
AST: 16 U/L (ref 15–41)
Albumin: 3.7 g/dL (ref 3.5–5.0)
Alkaline Phosphatase: 91 U/L (ref 38–126)
Anion gap: 11 (ref 5–15)
BUN: 8 mg/dL (ref 6–20)
CO2: 24 mmol/L (ref 22–32)
Calcium: 8.8 mg/dL — ABNORMAL LOW (ref 8.9–10.3)
Chloride: 105 mmol/L (ref 98–111)
Creatinine, Ser: 0.91 mg/dL (ref 0.44–1.00)
GFR calc Af Amer: >60 mL/min (ref >60)
GFR calc non Af Amer: >60 mL/min (ref >60)
Glucose, Bld: 115 mg/dL — ABNORMAL HIGH (ref 70–99)
Potassium: 3.5 mmol/L (ref 3.5–5.1)
Sodium: 140 mmol/L (ref 135–145)
Total Bilirubin: 0.5 mg/dL (ref 0.3–1.2)
Total Protein: 7 g/dL (ref 6.5–8.1)

## 2019-01-17 LAB — CBC
HCT: 41.5 % (ref 36.0–46.0)
Hemoglobin: 14 g/dL (ref 12.0–15.0)
MCH: 28.2 pg (ref 26.0–34.0)
MCHC: 33.7 g/dL (ref 30.0–36.0)
MCV: 83.7 fL (ref 80.0–100.0)
Platelets: 200 10*3/uL (ref 150–400)
RBC: 4.96 MIL/uL (ref 3.87–5.11)
RDW: 14.8 % (ref 11.5–15.5)
WBC: 9.8 10*3/uL (ref 4.0–10.5)
nRBC: 0 % (ref 0.0–0.2)

## 2019-01-17 LAB — TROPONIN I (HIGH SENSITIVITY): Troponin I (High Sensitivity): 2 ng/L (ref <18)

## 2019-01-17 MED ORDER — CYCLOBENZAPRINE HCL 10 MG PO TABS: 10.0000 mg | ORAL_TABLET | Freq: Three times a day (TID) | ORAL | 0 refills | Status: AC | PRN

## 2019-01-17 MED ORDER — NAPROXEN 500 MG PO TABS: 500.0000 mg | ORAL_TABLET | Freq: Two times a day (BID) | ORAL | 0 refills | Status: DC

## 2019-01-17 NOTE — ED Notes (Signed)
EDP at bedside  

## 2019-01-17 NOTE — ED Triage Notes (Signed)
Patient with complaint of right upper back pain that started this morning. Patient states that she ha nausea but no vomiting.

## 2019-01-17 NOTE — ED Notes (Signed)
Pt ambulatory to treatment room with no distress noted.  
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
## 2019-01-17 NOTE — Discharge Instructions (Signed)
Take the Naprosyn and Flexeril as prescribed.  Return to the ER for new, worsening, or persistent severe back pain, shortness of breath, chest pain, weakness, or any other new or worsening symptoms that concern you.

## 2019-01-17 NOTE — ED Notes (Signed)
Patient transported to X-ray 

## 2019-01-18 NOTE — Telephone Encounter (Signed)
I will remove from the pre op call back pool as pt will be seeing MD.

## 2019-01-18 NOTE — Telephone Encounter (Signed)
Pt has been scheduled for surgery clearance appt to see Dr. Fortino Sic on 12/4 @ 10 am I will forward notes to MD for appt and to surgeon as FYI.

## 2019-01-25 DIAGNOSIS — M1712 Unilateral primary osteoarthritis, left knee: Secondary | ICD-10-CM | POA: Diagnosis not present

## 2019-01-28 ENCOUNTER — Telehealth: Payer: Self-pay | Admitting: *Deleted

## 2019-01-28 NOTE — Telephone Encounter (Signed)
   [facility] Pre-operative Risk Assessment    Request for surgical clearance:  1. What type of surgery is being performed? Total knee arthroplasty   2. When is this surgery scheduled? TBD  3. What type of clearance is required (medical clearance vs. Pharmacy clearance to hold med vs. Both)? BOTH  4. Are there any medications that need to be held prior to surgery and how long? Plavix 75 mg   5. Practice name and name of physician performing surgery? [facility]; Dt Edmonia Lynch MD  6. What is your office phone number [phone]    7.   What is your office fax number [phone]  8.   Anesthesia type (None, local, MAC, general) ? unknown   [place], [place] 01/28/2019, 3:49 PM  _________________________________________________________________   (provider comments below)

## 2019-01-28 NOTE — Telephone Encounter (Signed)
Notes added to appt for surgery clearance. I will send clearance notes to Dr. Garen Lah for appt. I will remove from the pre op call back pool. I will send FYI to Dr. Percell Miller about appt for clearance.

## 2019-01-28 NOTE — Telephone Encounter (Signed)
Pre-op team  Please add pre-op clearance into the comments for the patients new patient appointment on 01/29/2019 and send notification to Dr. Garen Lah.    Thank you  Sharee Pimple

## 2019-01-29 ENCOUNTER — Encounter: Payer: Self-pay | Admitting: Cardiology

## 2019-01-29 ENCOUNTER — Ambulatory Visit (INDEPENDENT_AMBULATORY_CARE_PROVIDER_SITE_OTHER): Payer: BC Managed Care – PPO | Admitting: Cardiology

## 2019-01-29 ENCOUNTER — Other Ambulatory Visit: Payer: Self-pay

## 2019-01-29 ENCOUNTER — Telehealth: Payer: Self-pay | Admitting: Physician Assistant

## 2019-01-29 VITALS — BP 110/80 | HR 78 | Ht 61.0 in | Wt 193.8 lb

## 2019-01-29 DIAGNOSIS — E7800 Pure hypercholesterolemia, unspecified: Secondary | ICD-10-CM | POA: Diagnosis not present

## 2019-01-29 DIAGNOSIS — I25118 Atherosclerotic heart disease of native coronary artery with other forms of angina pectoris: Secondary | ICD-10-CM | POA: Diagnosis not present

## 2019-01-29 DIAGNOSIS — Z01818 Encounter for other preprocedural examination: Secondary | ICD-10-CM

## 2019-01-29 DIAGNOSIS — F17200 Nicotine dependence, unspecified, uncomplicated: Secondary | ICD-10-CM | POA: Diagnosis not present

## 2019-01-29 MED ORDER — ISOSORBIDE MONONITRATE ER 60 MG PO TB24: 60.0000 mg | ORAL_TABLET | Freq: Every day | ORAL | 3 refills | Status: DC

## 2019-01-29 NOTE — Telephone Encounter (Signed)
   Primary Cardiologist: Kate Sable, MD  Chart reviewed as part of pre-operative protocol coverage. Given past medical history and time since last visit, based on ACC/AHA guidelines, Rhonda Brown would be at acceptable risk for the planned procedure without further cardiovascular testing.   Pt may hold Plavix for 5-7 days prior to procedure then resume when safe from a procedural standpoint. Please see full MD clearance note to follow.   I will route this recommendation to the requesting party via Epic fax function and remove from pre-op pool.  Please call with questions.  Kathyrn Drown, NP 01/29/2019, 12:57 PM

## 2019-01-29 NOTE — Telephone Encounter (Signed)
Filled out surgical authorization form.
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
## 2019-01-29 NOTE — Patient Instructions (Signed)
Medication Instructions:  - Your physician has recommended you make the following change in your medication:   1) Increase imdur (isosorbide) to 60 mg- take 1 tablet (60 mg) by mouth once daily  *If you need a refill on your cardiac medications before your next appointment, please call your pharmacy*  Lab Work: - Your physician recommends that you return for FASTING lab work in: 3 months- Lipid panel (March 2021- a few days prior to your appointment with Dr. Garen Lah)  - Go to the [place] entrance, 1st desk on the right to check in - Lab hours: Monday- Friday (7:30 am- 5:30 pm) - DO NOT eat/ drink anything for 8 hours prior except you may have black coffee/ water  If you have labs (blood work) drawn today and your tests are completely normal, you will receive your results only by: [place] MyChart Message (if you have MyChart) OR . A paper copy in the mail If you have any lab test that is abnormal or we need to change your treatment, we will call you to review the results.  Testing/Procedures: - none ordered  Follow-Up: At [facility], you and your health needs are our priority.  As part of our continuing mission to provide you with exceptional heart care, we have created designated Provider Care Teams.  These Care Teams include your primary Cardiologist (physician) and Advanced Practice Providers (APPs -  Physician Assistants and Nurse Practitioners) who all work together to provide you with the care you need, when you need it.  Your next appointment:   3 month(s)  The format for your next appointment:   In Person  Provider:   Kate Sable, MD  Other Instructions n/a

## 2019-02-05 DIAGNOSIS — L821 Other seborrheic keratosis: Secondary | ICD-10-CM | POA: Diagnosis not present

## 2019-02-05 DIAGNOSIS — L739 Follicular disorder, unspecified: Secondary | ICD-10-CM | POA: Diagnosis not present

## 2019-02-05 DIAGNOSIS — D23121 Other benign neoplasm of skin of left upper eyelid, including canthus: Secondary | ICD-10-CM | POA: Diagnosis not present

## 2019-02-05 DIAGNOSIS — D485 Neoplasm of uncertain behavior of skin: Secondary | ICD-10-CM | POA: Diagnosis not present

## 2019-02-05 DIAGNOSIS — L918 Other hypertrophic disorders of the skin: Secondary | ICD-10-CM | POA: Diagnosis not present

## 2019-02-07 ENCOUNTER — Telehealth: Payer: Self-pay | Admitting: Nurse Practitioner

## 2019-02-07 NOTE — Telephone Encounter (Signed)
Called answering service - pain meds not working. I tried to call back but got voice mail

## 2019-02-08 ENCOUNTER — Telehealth: Payer: Self-pay | Admitting: Cardiology

## 2019-02-08 NOTE — Telephone Encounter (Signed)
lmov to schedule  °

## 2019-02-08 NOTE — Telephone Encounter (Signed)
-----   Message from Emily Filbert, RN sent at 02/03/2019  1:41 PM EST ----- Please call to arrange for a 3 month f/u with Dr. Garen Lah per checkout 12/4  Thanks!

## 2019-02-08 NOTE — Telephone Encounter (Signed)
Patient called back and stated she will call back closer to 3 month time frame to schedule

## 2019-02-23 ENCOUNTER — Ambulatory Visit: Payer: BC Managed Care – PPO | Attending: Internal Medicine

## 2019-02-23 DIAGNOSIS — Z20822 Contact with and (suspected) exposure to covid-19: Secondary | ICD-10-CM

## 2019-02-23 DIAGNOSIS — Z20828 Contact with and (suspected) exposure to other viral communicable diseases: Secondary | ICD-10-CM | POA: Diagnosis not present

## 2019-02-24 LAB — NOVEL CORONAVIRUS, NAA: SARS-CoV-2, NAA: NOT DETECTED

## 2019-03-04 ENCOUNTER — Telehealth: Payer: Self-pay

## 2019-03-04 NOTE — Telephone Encounter (Signed)
Copied from [place] [phone]. Topic: General - Other >> Mar 04, 2019  1:00 PM Rayann Heman wrote: Reason for CRM: [facility] sent a fax knee surgery that needs to be filled out. Please advise

## 2019-03-05 NOTE — Telephone Encounter (Signed)
Left voicemail for patient to return call to schedule appointment for surgical clearance. If patient return call it is okay for [facility] schedule office visit for patient.

## 2019-03-05 NOTE — Telephone Encounter (Signed)
If it is for surgical clearance she will need a visit. She will also need cardiac clearance form her heart doctor as well. Schedule visit if necessary.

## 2019-03-05 NOTE — Telephone Encounter (Signed)
I received this fax?

## 2019-03-11 ENCOUNTER — Telehealth: Payer: Self-pay

## 2019-03-11 NOTE — Telephone Encounter (Signed)
Called pt to ps for appointment tomorrow. If patient calls back it is okay for someone else to ps.

## 2019-03-11 NOTE — Progress Notes (Signed)
Patient left without being seen.

## 2019-03-12 ENCOUNTER — Ambulatory Visit (INDEPENDENT_AMBULATORY_CARE_PROVIDER_SITE_OTHER): Payer: BC Managed Care – PPO | Admitting: Physician Assistant

## 2019-03-12 ENCOUNTER — Telehealth: Payer: Self-pay | Admitting: Physician Assistant

## 2019-03-12 DIAGNOSIS — Z5321 Procedure and treatment not carried out due to patient leaving prior to being seen by health care provider: Secondary | ICD-10-CM

## 2019-03-12 NOTE — Telephone Encounter (Signed)
I faxed a surgical clearance for Rhonda Brown on 01/29/2019. I didn't realize this was for the same surgery. The orthopedics office should have the forms already.

## 2019-03-12 NOTE — Telephone Encounter (Signed)
Patient was advised and I refax the sirgical clearance to Sherri w/ [facility].

## 2019-03-26 DIAGNOSIS — M1712 Unilateral primary osteoarthritis, left knee: Secondary | ICD-10-CM | POA: Diagnosis not present

## 2019-03-29 DIAGNOSIS — M1712 Unilateral primary osteoarthritis, left knee: Secondary | ICD-10-CM | POA: Diagnosis present

## 2019-03-29 DIAGNOSIS — Z955 Presence of coronary angioplasty implant and graft: Secondary | ICD-10-CM

## 2019-04-11 ENCOUNTER — Other Ambulatory Visit: Payer: Self-pay | Admitting: Cardiology

## 2019-04-12 ENCOUNTER — Encounter (HOSPITAL_COMMUNITY): Payer: BC Managed Care – PPO

## 2019-04-12 ENCOUNTER — Other Ambulatory Visit: Payer: Self-pay

## 2019-04-12 ENCOUNTER — Emergency Department: Payer: BC Managed Care – PPO

## 2019-04-12 ENCOUNTER — Inpatient Hospital Stay
Admission: EM | Admit: 2019-04-12 | Discharge: 2019-04-14 | DRG: 391 | Disposition: A | Discharge status: Home / Self Care | Payer: BC Managed Care – PPO | Attending: Hospitalist | Admitting: Hospitalist

## 2019-04-12 ENCOUNTER — Encounter: Payer: Self-pay | Admitting: *Deleted

## 2019-04-12 ENCOUNTER — Encounter (HOSPITAL_COMMUNITY): Admission: RE | Admit: 2019-04-12 | Payer: BC Managed Care – PPO | Source: Ambulatory Visit

## 2019-04-12 DIAGNOSIS — I2510 Atherosclerotic heart disease of native coronary artery without angina pectoris: Secondary | ICD-10-CM | POA: Diagnosis not present

## 2019-04-12 DIAGNOSIS — E876 Hypokalemia: Secondary | ICD-10-CM | POA: Diagnosis not present

## 2019-04-12 DIAGNOSIS — K852 Alcohol induced acute pancreatitis without necrosis or infection: Secondary | ICD-10-CM | POA: Diagnosis present

## 2019-04-12 DIAGNOSIS — Z7982 Long term (current) use of aspirin: Secondary | ICD-10-CM

## 2019-04-12 DIAGNOSIS — Z20822 Contact with and (suspected) exposure to covid-19: Secondary | ICD-10-CM | POA: Diagnosis present

## 2019-04-12 DIAGNOSIS — R197 Diarrhea, unspecified: Secondary | ICD-10-CM | POA: Diagnosis not present

## 2019-04-12 DIAGNOSIS — Z833 Family history of diabetes mellitus: Secondary | ICD-10-CM

## 2019-04-12 DIAGNOSIS — R1013 Epigastric pain: Secondary | ICD-10-CM

## 2019-04-12 DIAGNOSIS — Z8249 Family history of ischemic heart disease and other diseases of the circulatory system: Secondary | ICD-10-CM | POA: Diagnosis not present

## 2019-04-12 DIAGNOSIS — Z79899 Other long term (current) drug therapy: Secondary | ICD-10-CM | POA: Diagnosis not present

## 2019-04-12 DIAGNOSIS — K859 Acute pancreatitis without necrosis or infection, unspecified: Secondary | ICD-10-CM | POA: Diagnosis present

## 2019-04-12 DIAGNOSIS — Z803 Family history of malignant neoplasm of breast: Secondary | ICD-10-CM

## 2019-04-12 DIAGNOSIS — I252 Old myocardial infarction: Secondary | ICD-10-CM | POA: Diagnosis not present

## 2019-04-12 DIAGNOSIS — R748 Abnormal levels of other serum enzymes: Secondary | ICD-10-CM

## 2019-04-12 DIAGNOSIS — E7800 Pure hypercholesterolemia, unspecified: Secondary | ICD-10-CM | POA: Diagnosis not present

## 2019-04-12 DIAGNOSIS — R112 Nausea with vomiting, unspecified: Secondary | ICD-10-CM

## 2019-04-12 DIAGNOSIS — K5900 Constipation, unspecified: Secondary | ICD-10-CM | POA: Diagnosis present

## 2019-04-12 DIAGNOSIS — Z823 Family history of stroke: Secondary | ICD-10-CM

## 2019-04-12 DIAGNOSIS — K429 Umbilical hernia without obstruction or gangrene: Secondary | ICD-10-CM | POA: Diagnosis not present

## 2019-04-12 DIAGNOSIS — K529 Noninfective gastroenteritis and colitis, unspecified: Principal | ICD-10-CM | POA: Diagnosis present

## 2019-04-12 DIAGNOSIS — F1290 Cannabis use, unspecified, uncomplicated: Secondary | ICD-10-CM | POA: Diagnosis not present

## 2019-04-12 DIAGNOSIS — Z8051 Family history of malignant neoplasm of kidney: Secondary | ICD-10-CM

## 2019-04-12 DIAGNOSIS — Z87442 Personal history of urinary calculi: Secondary | ICD-10-CM

## 2019-04-12 DIAGNOSIS — E785 Hyperlipidemia, unspecified: Secondary | ICD-10-CM | POA: Diagnosis not present

## 2019-04-12 DIAGNOSIS — R1110 Vomiting, unspecified: Secondary | ICD-10-CM | POA: Diagnosis not present

## 2019-04-12 DIAGNOSIS — I2583 Coronary atherosclerosis due to lipid rich plaque: Secondary | ICD-10-CM

## 2019-04-12 DIAGNOSIS — Z9049 Acquired absence of other specified parts of digestive tract: Secondary | ICD-10-CM | POA: Diagnosis not present

## 2019-04-12 DIAGNOSIS — Z720 Tobacco use: Secondary | ICD-10-CM | POA: Diagnosis present

## 2019-04-12 LAB — CBC
HCT: 47.7 % — ABNORMAL HIGH (ref 36.0–46.0)
Hemoglobin: 15.5 g/dL — ABNORMAL HIGH (ref 12.0–15.0)
MCH: 27.8 pg (ref 26.0–34.0)
MCHC: 32.5 g/dL (ref 30.0–36.0)
MCV: 85.6 fL (ref 80.0–100.0)
Platelets: 220 10*3/uL (ref 150–400)
RBC: 5.57 MIL/uL — ABNORMAL HIGH (ref 3.87–5.11)
RDW: 14.5 % (ref 11.5–15.5)
WBC: 10.6 10*3/uL — ABNORMAL HIGH (ref 4.0–10.5)
nRBC: 0 % (ref 0.0–0.2)

## 2019-04-12 LAB — COMPREHENSIVE METABOLIC PANEL
ALT: 25 U/L (ref 0–44)
AST: 32 U/L (ref 15–41)
Albumin: 4.1 g/dL (ref 3.5–5.0)
Alkaline Phosphatase: 113 U/L (ref 38–126)
Anion gap: 11 (ref 5–15)
BUN: 7 mg/dL (ref 6–20)
CO2: 26 mmol/L (ref 22–32)
Calcium: 9.3 mg/dL (ref 8.9–10.3)
Chloride: 103 mmol/L (ref 98–111)
Creatinine, Ser: 0.77 mg/dL (ref 0.44–1.00)
GFR calc Af Amer: >60 mL/min (ref >60)
GFR calc non Af Amer: >60 mL/min (ref >60)
Glucose, Bld: 111 mg/dL — ABNORMAL HIGH (ref 70–99)
Potassium: 2.8 mmol/L — ABNORMAL LOW (ref 3.5–5.1)
Sodium: 140 mmol/L (ref 135–145)
Total Bilirubin: 0.8 mg/dL (ref 0.3–1.2)
Total Protein: 7.7 g/dL (ref 6.5–8.1)

## 2019-04-12 LAB — RESPIRATORY PANEL BY RT PCR (FLU A&B, COVID)
Influenza A by PCR: NEGATIVE
Influenza B by PCR: NEGATIVE
SARS Coronavirus 2 by RT PCR: NEGATIVE

## 2019-04-12 LAB — POC SARS CORONAVIRUS 2 AG: SARS Coronavirus 2 Ag: NEGATIVE

## 2019-04-12 LAB — URINALYSIS, COMPLETE (UACMP) WITH MICROSCOPIC
Bilirubin Urine: NEGATIVE
Glucose, UA: NEGATIVE mg/dL
Hgb urine dipstick: NEGATIVE
Ketones, ur: NEGATIVE mg/dL
Leukocytes,Ua: NEGATIVE
Nitrite: NEGATIVE
Protein, ur: NEGATIVE mg/dL
Specific Gravity, Urine: 1.016 (ref 1.005–1.030)
pH: 6 (ref 5.0–8.0)

## 2019-04-12 LAB — LIPASE, BLOOD: Lipase: 273 U/L — ABNORMAL HIGH (ref 11–51)

## 2019-04-12 MED ORDER — SODIUM CHLORIDE 0.9% FLUSH: 3.0000 mL | Freq: Once | INTRAVENOUS | Status: DC

## 2019-04-12 MED ORDER — KETOROLAC TROMETHAMINE 30 MG/ML IJ SOLN
15.0000 mg | Freq: Once | INTRAMUSCULAR | Status: AC
  Administered 2019-04-12: 15 mg via INTRAVENOUS
  Filled 2019-04-12: qty 1

## 2019-04-12 MED ORDER — POTASSIUM CHLORIDE CRYS ER 20 MEQ PO TBCR
40.0000 meq | EXTENDED_RELEASE_TABLET | Freq: Once | ORAL | Status: AC
  Administered 2019-04-12: 40 meq via ORAL
  Filled 2019-04-12: qty 2

## 2019-04-12 MED ORDER — HYDROCODONE-ACETAMINOPHEN 5-325 MG PO TABS
1.0000 | ORAL_TABLET | Freq: Once | ORAL | Status: DC
  Filled 2019-04-12: qty 1

## 2019-04-12 MED ORDER — POTASSIUM CHLORIDE 10 MEQ/100ML IV SOLN
10.0000 meq | Freq: Once | INTRAVENOUS | Status: AC
  Administered 2019-04-12: 10 meq via INTRAVENOUS
  Filled 2019-04-12: qty 100

## 2019-04-12 MED ORDER — MORPHINE SULFATE (PF) 4 MG/ML IV SOLN
4.0000 mg | INTRAVENOUS | Status: DC | PRN
  Administered 2019-04-12 – 2019-04-13 (×2): 4 mg via INTRAVENOUS

## 2019-04-12 MED ORDER — SODIUM CHLORIDE 0.9 % IV BOLUS
1000.0000 mL | Freq: Once | INTRAVENOUS | Status: AC
  Administered 2019-04-12: 1000 mL via INTRAVENOUS

## 2019-04-12 MED ORDER — PROMETHAZINE HCL 25 MG/ML IJ SOLN
12.5000 mg | Freq: Four times a day (QID) | INTRAMUSCULAR | Status: DC | PRN
  Administered 2019-04-12: 12.5 mg via INTRAVENOUS
  Filled 2019-04-12: qty 1

## 2019-04-12 MED ORDER — SODIUM CHLORIDE 0.9 % IV SOLN: Freq: Once | INTRAVENOUS | Status: AC

## 2019-04-12 MED ORDER — MORPHINE SULFATE (PF) 4 MG/ML IV SOLN
4.0000 mg | INTRAVENOUS | Status: DC | PRN
  Administered 2019-04-12: 4 mg via INTRAVENOUS
  Filled 2019-04-12 (×3): qty 1

## 2019-04-12 MED ORDER — IOHEXOL 300 MG/ML  SOLN
100.0000 mL | Freq: Once | INTRAMUSCULAR | Status: AC | PRN
  Administered 2019-04-12: 100 mL via INTRAVENOUS
  Filled 2019-04-12: qty 100
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
## 2019-04-12 NOTE — ED Triage Notes (Signed)
Pt ambulatory to triage. Pt reports abd pain for 1 week. States pain above the navel.  Diarrhea x5.  Vomiting x 2.  Pt alert  Speech clear.
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
## 2019-04-12 NOTE — ED Notes (Signed)
Transported to CT 

## 2019-04-13 DIAGNOSIS — K852 Alcohol induced acute pancreatitis without necrosis or infection: Secondary | ICD-10-CM | POA: Diagnosis not present

## 2019-04-13 LAB — MAGNESIUM: Magnesium: 1.9 mg/dL (ref 1.7–2.4)

## 2019-04-13 LAB — COMPREHENSIVE METABOLIC PANEL
ALT: 52 U/L — ABNORMAL HIGH (ref 0–44)
AST: 108 U/L — ABNORMAL HIGH (ref 15–41)
Albumin: 3.4 g/dL — ABNORMAL LOW (ref 3.5–5.0)
Alkaline Phosphatase: 127 U/L — ABNORMAL HIGH (ref 38–126)
Anion gap: 6 (ref 5–15)
BUN: 7 mg/dL (ref 6–20)
CO2: 26 mmol/L (ref 22–32)
Calcium: 8.6 mg/dL — ABNORMAL LOW (ref 8.9–10.3)
Chloride: 107 mmol/L (ref 98–111)
Creatinine, Ser: 0.83 mg/dL (ref 0.44–1.00)
GFR calc Af Amer: >60 mL/min (ref >60)
GFR calc non Af Amer: >60 mL/min (ref >60)
Glucose, Bld: 114 mg/dL — ABNORMAL HIGH (ref 70–99)
Potassium: 3.2 mmol/L — ABNORMAL LOW (ref 3.5–5.1)
Sodium: 139 mmol/L (ref 135–145)
Total Bilirubin: 1.1 mg/dL (ref 0.3–1.2)
Total Protein: 6.3 g/dL — ABNORMAL LOW (ref 6.5–8.1)

## 2019-04-13 LAB — CBC
HCT: 40.8 % (ref 36.0–46.0)
Hemoglobin: 13.3 g/dL (ref 12.0–15.0)
MCH: 28.4 pg (ref 26.0–34.0)
MCHC: 32.6 g/dL (ref 30.0–36.0)
MCV: 87 fL (ref 80.0–100.0)
Platelets: 173 10*3/uL (ref 150–400)
RBC: 4.69 MIL/uL (ref 3.87–5.11)
RDW: 14.6 % (ref 11.5–15.5)
WBC: 7.3 10*3/uL (ref 4.0–10.5)
nRBC: 0 % (ref 0.0–0.2)

## 2019-04-13 LAB — HIV ANTIBODY (ROUTINE TESTING W REFLEX): HIV Screen 4th Generation wRfx: NONREACTIVE

## 2019-04-13 LAB — LIPASE, BLOOD: Lipase: 39 U/L (ref 11–51)

## 2019-04-13 MED ORDER — ALUM & MAG HYDROXIDE-SIMETH 200-200-20 MG/5ML PO SUSP: 15.0000 mL | Freq: Four times a day (QID) | ORAL | Status: DC | PRN

## 2019-04-13 MED ORDER — MORPHINE SULFATE (PF) 2 MG/ML IV SOLN: 2.0000 mg | INTRAVENOUS | Status: DC | PRN

## 2019-04-13 MED ORDER — CALCIUM CARBONATE ANTACID 500 MG PO CHEW: 1.0000 | CHEWABLE_TABLET | Freq: Three times a day (TID) | ORAL | Status: DC | PRN

## 2019-04-13 MED ORDER — ONDANSETRON HCL 4 MG PO TABS: 4.0000 mg | ORAL_TABLET | Freq: Four times a day (QID) | ORAL | Status: DC | PRN

## 2019-04-13 MED ORDER — DOCUSATE SODIUM 100 MG PO CAPS: 100.0000 mg | ORAL_CAPSULE | Freq: Two times a day (BID) | ORAL | Status: DC | PRN

## 2019-04-13 MED ORDER — POLYETHYLENE GLYCOL 3350 17 G PO PACK: 17.0000 g | PACK | Freq: Two times a day (BID) | ORAL | Status: DC | PRN

## 2019-04-13 MED ORDER — KCL-LACTATED RINGERS-D5W 20 MEQ/L IV SOLN
INTRAVENOUS | Status: DC
  Filled 2019-04-13 (×8): qty 1000

## 2019-04-13 MED ORDER — ENOXAPARIN SODIUM 40 MG/0.4ML ~~LOC~~ SOLN
40.0000 mg | SUBCUTANEOUS | Status: DC
  Filled 2019-04-13 (×2): qty 0.4

## 2019-04-13 MED ORDER — ONDANSETRON HCL 4 MG/2ML IJ SOLN
4.0000 mg | Freq: Four times a day (QID) | INTRAMUSCULAR | Status: DC | PRN
  Administered 2019-04-13: 4 mg via INTRAVENOUS
  Filled 2019-04-13: qty 2

## 2019-04-13 MED ORDER — ACETAMINOPHEN 500 MG PO TABS: 1000.0000 mg | ORAL_TABLET | Freq: Three times a day (TID) | ORAL | Status: DC | PRN

## 2019-04-13 MED ORDER — GUAIFENESIN-DM 100-10 MG/5ML PO SYRP
10.0000 mL | ORAL_SOLUTION | Freq: Four times a day (QID) | ORAL | Status: DC | PRN
  Filled 2019-04-13: qty 10

## 2019-04-13 NOTE — ED Notes (Addendum)
Lunch tray provided; pt able to feed self. 

## 2019-04-13 NOTE — ED Notes (Signed)
Pt ambulatory to bathroom independently

## 2019-04-13 NOTE — Progress Notes (Signed)
PROGRESS NOTE    Rhonda Brown  [id number] DOB: [birth year] DOA: 04/12/2019 PCP: Trinna Post, PA-C    Assessment & Plan:   Principal Problem:   Pancreatitis, acute Active Problems:   Tobacco abuse   Hyperlipidemia   CAD (coronary artery disease)   Hypokalemia   Nausea and vomiting   Diarrhea   Marijuana smoker, continuous    Rhonda Brown is a 54 y.o. female with medical history significant of coronary artery disease status post previous stents, ongoing polysubstance use including tobacco and alcohol who has had binge drinking on and off including this past weekend, recurrent headache, anxiety disorder presented to the ER with epigastric pain radiating to the back rated as 9 out of 10 associated with nausea.  Nausea and symptoms get worse with eating.  She was unable to keep anything down.  Patient has had diarrhea in the last couple of days which has now turned into constipation.  She denied any fever or chills denied any sick contacts including COVID-19.  Patient was seen and evaluated.  She was found to have elevated lipase but CT abdomen pelvis showed no evidence of inflamed pancreas.  She did have umbilical hernia which was reduced. Pt was admitted for observation and suspected acute alcoholic pancreatitis..   #1 acute alcoholic pancreatitis:  Lipase mildly elevated, already back to normal next day. --continue MIVF --Counseling provided regarding alcohol use.  # Vomiting and diarrhea --Symptoms don't seem to all correlate with pancreatitis.  Pt reported a dx of gastroparesis.   --GI path pending --obtain C diff if continues to have watery diarrhea --clear liquid for now --continue MIVF  #2 hypokalemia: Replete potassium aggressively.  #3 tobacco or marijuana use: Counseling provided.  Patient offered nicotine patch.  #4 reported binge alcohol drinking: Counseling provided.  Patient denies ongoing use.  Watch for any signs of withdrawal if so will initiate  CIWA protocol  #5 coronary artery disease: Stable with no significant findings.  #6 resolving colitis with umbilical hernia: Hernia is reduced.  Supportive care for resolving colitis.  If patient has diarrhea check for C. difficile   DVT prophylaxis: Lovenox SQ Code Status: Full code  Family Communication: not today Disposition Plan: Home when diarrhea improves, likely tomorrow   Subjective and Interval History:  Pt said she felt better when she first awoke this morning, but then started having diarrhea and abdominal discomfort again, especially after eating a little bit of pudding.  Tolerating clear liquids.  No fever, dyspnea, chest pain, dysuria, increased swelling.   Objective: Vitals:   04/13/19 0800 04/13/19 1655 04/13/19 1737 04/13/19 2102  BP: 118/90 126/90 121/87 124/89  Pulse: (!) 52 (!) 53 66 60  Resp:  16 15 18   Temp:   97.7 F (36.5 C) 97.6 F (36.4 C)  TempSrc:   Oral Oral  SpO2: 97% 96% 98% 95%  Weight:      Height:        Intake/Output Summary (Last 24 hours) at 04/14/2019 0248 Last data filed at 04/13/2019 2125 Gross per 24 hour  Intake --  Output 2 ml  Net -2 ml   Filed Weights   04/12/19 1539  Weight: 83 kg    Examination:   Constitutional: NAD, AAOx3 HEENT: conjunctivae and lids normal, EOMI CV: RRR no M,R,G. Distal pulses +2.  No cyanosis.   RESP: CTA B/L, normal respiratory effort  GI: +BS, NTND Extremities: No effusions, edema, or tenderness in BLE SKIN: warm, dry and intact Neuro: II - XII  grossly intact.  Sensation intact Psych: Normal mood and affect.     Data Reviewed: I have personally reviewed following labs and imaging studies  CBC: Recent Labs  Lab 04/12/19 1546 04/13/19 0150  WBC 10.6* 7.3  HGB 15.5* 13.3  HCT 47.7* 40.8  MCV 85.6 87.0  PLT 220 [id number]   Basic Metabolic Panel: Recent Labs  Lab 04/12/19 1546 04/13/19 0150  NA 140 139  K 2.8* 3.2*  CL 103 107  CO2 26 26  GLUCOSE 111* 114*  BUN 7 7  CREATININE  0.77 0.83  CALCIUM 9.3 8.6*  MG  --  1.9   GFR: Estimated Creatinine Clearance: 76.6 mL/min (by C-G formula based on SCr of 0.83 mg/dL). Liver Function Tests: Recent Labs  Lab 04/12/19 1546 04/13/19 0150  AST 32 108*  ALT 25 52*  ALKPHOS 113 127*  BILITOT 0.8 1.1  PROT 7.7 6.3*  ALBUMIN 4.1 3.4*   Recent Labs  Lab 04/12/19 1546 04/13/19 0150  LIPASE 273* 39   No results for input(s): AMMONIA in the last 168 hours. Coagulation Profile: No results for input(s): INR, PROTIME in the last 168 hours. Cardiac Enzymes: No results for input(s): CKTOTAL, CKMB, CKMBINDEX, TROPONINI in the last 168 hours. BNP (last 3 results) No results for input(s): PROBNP in the last 8760 hours. HbA1C: No results for input(s): HGBA1C in the last 72 hours. CBG: No results for input(s): GLUCAP in the last 168 hours. Lipid Profile: No results for input(s): CHOL, HDL, LDLCALC, TRIG, CHOLHDL, LDLDIRECT in the last 72 hours. Thyroid Function Tests: No results for input(s): TSH, T4TOTAL, FREET4, T3FREE, THYROIDAB in the last 72 hours. Anemia Panel: No results for input(s): VITAMINB12, FOLATE, FERRITIN, TIBC, IRON, RETICCTPCT in the last 72 hours. Sepsis Labs: No results for input(s): PROCALCITON, LATICACIDVEN in the last 168 hours.  Recent Results (from the past 240 hour(s))  Respiratory Panel by RT PCR (Flu A&B, Covid) - Nasopharyngeal Swab     Status: None   Collection Time: 04/12/19  8:16 PM   Specimen: Nasopharyngeal Swab  Result Value Ref Range Status   SARS Coronavirus 2 by RT PCR NEGATIVE NEGATIVE Final    Comment: (NOTE) SARS-CoV-2 target nucleic acids are NOT DETECTED. The SARS-CoV-2 RNA is generally detectable in upper respiratoy specimens during the acute phase of infection. The lowest concentration of SARS-CoV-2 viral copies this assay can detect is 131 copies/mL. A negative result does not preclude SARS-Cov-2 infection and should not be used as the sole basis for treatment  or other patient management decisions. A negative result may occur with  improper specimen collection/handling, submission of specimen other than nasopharyngeal swab, presence of viral mutation(s) within the areas targeted by this assay, and inadequate number of viral copies (<131 copies/mL). A negative result must be combined with clinical observations, patient history, and epidemiological information. The expected result is Negative. Fact Sheet for Patients:  PinkCheek.be Fact Sheet for Healthcare Providers:  GravelBags.it This test is not yet ap proved or cleared by the [place] FDA and  has been authorized for detection and/or diagnosis of SARS-CoV-2 by FDA under an Emergency Use Authorization (EUA). This EUA will remain  in effect (meaning this test can be used) for the duration of the COVID-19 declaration under Section 564(b)(1) of the Act, 21 U.S.C. section 360bbb-3(b)(1), unless the authorization is terminated or revoked sooner.    Influenza A by PCR NEGATIVE NEGATIVE Final   Influenza B by PCR NEGATIVE NEGATIVE Final    Comment: (NOTE) The  Xpert Xpress SARS-CoV-2/FLU/RSV assay is intended as an aid in  the diagnosis of influenza from Nasopharyngeal swab specimens and  should not be used as a sole basis for treatment. Nasal washings and  aspirates are unacceptable for Xpert Xpress SARS-CoV-2/FLU/RSV  testing. Fact Sheet for Patients: PinkCheek.be Fact Sheet for Healthcare Providers: GravelBags.it This test is not yet approved or cleared by the [place] FDA and  has been authorized for detection and/or diagnosis of SARS-CoV-2 by  FDA under an Emergency Use Authorization (EUA). This EUA will remain  in effect (meaning this test can be used) for the duration of the  Covid-19 declaration under Section 564(b)(1) of the Act, 21  U.S.C. section  360bbb-3(b)(1), unless the authorization is  terminated or revoked. Performed at [facility], [address], [place], [place] [address]       Radiology Studies: CT ABDOMEN PELVIS W CONTRAST  Result Date: 04/12/2019 CLINICAL DATA:  Abdominal pain for 1 week, pain above the navel, diarrhea, vomiting EXAM: CT ABDOMEN AND PELVIS WITH CONTRAST TECHNIQUE: Multidetector CT imaging of the abdomen and pelvis was performed using the standard protocol following bolus administration of intravenous contrast. CONTRAST:  137mL OMNIPAQUE IOHEXOL 300 MG/ML  SOLN COMPARISON:  05/24/2016 FINDINGS: Lower chest: No acute abnormality. Hepatobiliary: No focal liver abnormality is seen. Hepatic steatosis. Status post cholecystectomy. No biliary dilatation. Pancreas: Unremarkable. No pancreatic ductal dilatation or surrounding inflammatory changes. Spleen: Normal in size without significant abnormality. Adrenals/Urinary Tract: Adrenal glands are unremarkable. Kidneys are normal, without renal calculi, solid lesion, or hydronephrosis. Bladder is unremarkable. Stomach/Bowel: Stomach is within normal limits. The appendix is not clearly visualized and may be surgically absent. There is mild, diffuse wall thickening and minimal adjacent fat stranding of the colon, which is generally decompressed. There is fibrofatty mural stratification of the cecal base (series 5, image 42, 51, 37). Vascular/Lymphatic: Aortic atherosclerosis. No enlarged abdominal or pelvic lymph nodes. Reproductive: No mass or other significant abnormality. Other: There is a new fat containing midline ventral hernia above the umbilicus with associated fat stranding, measuring 3.8 x 3.2 cm with a neck measuring 1.9 cm (series 2, image 41). No abdominopelvic ascites. Musculoskeletal: No acute or significant osseous findings. IMPRESSION: 1. Mild, diffuse wall thickening and minimal adjacent fat stranding of the colon, which is generally decompressed, which  may represent a nonspecific infectious or inflammatory colitis. There is some mural stratification of the cecal base, suggestive of chronic inflammation, raising suspicion for inflammatory bowel disease. 2. There is a new fat containing midline ventral hernia above the umbilicus with associated fat stranding, measuring 3.8 x 3.2 cm with a neck measuring 1.9 cm. Correlate for localized point tenderness given report of supraumbilical abdominal pain. No evidence of bowel involvement or obstruction. 3. Hepatic steatosis. 4. Aortic Atherosclerosis (ICD10-I70.0). Electronically Signed   By: Eddie Candle M.D.   On: 04/12/2019 18:40     Scheduled Meds:  enoxaparin (LOVENOX) injection  40 mg Subcutaneous Q24H   sodium chloride flush  3 mL Intravenous Once   Continuous Infusions:  dextrose 5% lactated ringers with KCl 20 mEq/L 125 mL/hr at 04/13/19 1904     LOS: 0 days     Enzo Bi, MD [facility] If 7PM-7AM, please contact night-coverage 04/14/2019, 2:48 AM

## 2019-04-13 NOTE — ED Notes (Signed)
Pt tolerated clear liquid diet; will advance to full liquid per order set.

## 2019-04-13 NOTE — ED Notes (Signed)
Dinner tray provided; pt able to feed self.

## 2019-04-13 NOTE — Progress Notes (Signed)
Was at bedside and offered Midline insertion ,pt. notified of the  procedure but pt. Refused . Even refused for regular PIV start , RN made aware and at bedside.    Active Line/Drains/Airways    Name:   Placement date:   Placement time:   Site:   Days:   Peripheral IV 04/12/19 Left Antecubital   04/12/19    1807    Antecubital   1   Incision (Closed) 10/28/17 Abdomen Other (Comment)   10/28/17    1049     532   Incision - 4 Ports Abdomen 1: Umbilicus 2: Mid;Upper 3: Right;Medial 4: Right;Lateral   10/28/17    --     532

## 2019-04-13 NOTE — ED Notes (Signed)
Pt provided breakfast tray; able to feed self. 

## 2019-04-13 NOTE — ED Notes (Signed)
Pt unable to tolerate full liquid diet; pt presents with abdominal pain and diarrhea after lunch.  Diet order to be readjusted.

## 2019-04-14 DIAGNOSIS — I2510 Atherosclerotic heart disease of native coronary artery without angina pectoris: Secondary | ICD-10-CM | POA: Diagnosis present

## 2019-04-14 DIAGNOSIS — E785 Hyperlipidemia, unspecified: Secondary | ICD-10-CM | POA: Diagnosis present

## 2019-04-14 DIAGNOSIS — Z87442 Personal history of urinary calculi: Secondary | ICD-10-CM | POA: Diagnosis not present

## 2019-04-14 DIAGNOSIS — Z833 Family history of diabetes mellitus: Secondary | ICD-10-CM | POA: Diagnosis not present

## 2019-04-14 DIAGNOSIS — K5900 Constipation, unspecified: Secondary | ICD-10-CM | POA: Diagnosis present

## 2019-04-14 DIAGNOSIS — Z20822 Contact with and (suspected) exposure to covid-19: Secondary | ICD-10-CM | POA: Diagnosis present

## 2019-04-14 DIAGNOSIS — Z8051 Family history of malignant neoplasm of kidney: Secondary | ICD-10-CM | POA: Diagnosis not present

## 2019-04-14 DIAGNOSIS — I252 Old myocardial infarction: Secondary | ICD-10-CM | POA: Diagnosis not present

## 2019-04-14 DIAGNOSIS — Z79899 Other long term (current) drug therapy: Secondary | ICD-10-CM | POA: Diagnosis not present

## 2019-04-14 DIAGNOSIS — Z8249 Family history of ischemic heart disease and other diseases of the circulatory system: Secondary | ICD-10-CM | POA: Diagnosis not present

## 2019-04-14 DIAGNOSIS — K529 Noninfective gastroenteritis and colitis, unspecified: Secondary | ICD-10-CM | POA: Diagnosis present

## 2019-04-14 DIAGNOSIS — K859 Acute pancreatitis without necrosis or infection, unspecified: Secondary | ICD-10-CM | POA: Diagnosis present

## 2019-04-14 DIAGNOSIS — K852 Alcohol induced acute pancreatitis without necrosis or infection: Secondary | ICD-10-CM | POA: Diagnosis not present

## 2019-04-14 DIAGNOSIS — Z803 Family history of malignant neoplasm of breast: Secondary | ICD-10-CM | POA: Diagnosis not present

## 2019-04-14 DIAGNOSIS — F1290 Cannabis use, unspecified, uncomplicated: Secondary | ICD-10-CM | POA: Diagnosis present

## 2019-04-14 DIAGNOSIS — E876 Hypokalemia: Secondary | ICD-10-CM | POA: Diagnosis present

## 2019-04-14 DIAGNOSIS — Z9049 Acquired absence of other specified parts of digestive tract: Secondary | ICD-10-CM | POA: Diagnosis not present

## 2019-04-14 DIAGNOSIS — Z823 Family history of stroke: Secondary | ICD-10-CM | POA: Diagnosis not present

## 2019-04-14 DIAGNOSIS — Z7982 Long term (current) use of aspirin: Secondary | ICD-10-CM | POA: Diagnosis not present

## 2019-04-14 DIAGNOSIS — K429 Umbilical hernia without obstruction or gangrene: Secondary | ICD-10-CM | POA: Diagnosis present

## 2019-04-14 LAB — GI PATHOGEN PANEL BY PCR, STOOL

## 2019-04-14 LAB — CBC
HCT: 40.8 % (ref 36.0–46.0)
Hemoglobin: 13.1 g/dL (ref 12.0–15.0)
MCH: 28 pg (ref 26.0–34.0)
MCHC: 32.1 g/dL (ref 30.0–36.0)
MCV: 87.2 fL (ref 80.0–100.0)
Platelets: 181 10*3/uL (ref 150–400)
RBC: 4.68 MIL/uL (ref 3.87–5.11)
RDW: 14.7 % (ref 11.5–15.5)
WBC: 6.5 10*3/uL (ref 4.0–10.5)
nRBC: 0 % (ref 0.0–0.2)

## 2019-04-14 LAB — MAGNESIUM: Magnesium: 1.9 mg/dL (ref 1.7–2.4)

## 2019-04-14 LAB — BASIC METABOLIC PANEL
Anion gap: 5 (ref 5–15)
BUN: <5 mg/dL — ABNORMAL LOW (ref 6–20)
CO2: 26 mmol/L (ref 22–32)
Calcium: 8.7 mg/dL — ABNORMAL LOW (ref 8.9–10.3)
Chloride: 109 mmol/L (ref 98–111)
Creatinine, Ser: 0.8 mg/dL (ref 0.44–1.00)
GFR calc Af Amer: >60 mL/min (ref >60)
GFR calc non Af Amer: >60 mL/min (ref >60)
Glucose, Bld: 148 mg/dL — ABNORMAL HIGH (ref 70–99)
Potassium: 3.4 mmol/L — ABNORMAL LOW (ref 3.5–5.1)
Sodium: 140 mmol/L (ref 135–145)

## 2019-04-14 MED ORDER — METRONIDAZOLE 500 MG PO TABS: 500.0000 mg | ORAL_TABLET | Freq: Three times a day (TID) | ORAL | 0 refills | Status: AC

## 2019-04-14 MED ORDER — NAPROXEN 500 MG PO TABS: 500.0000 mg | ORAL_TABLET | Freq: Two times a day (BID) | ORAL | Status: AC | PRN

## 2019-04-14 MED ORDER — CIPROFLOXACIN HCL 500 MG PO TABS: 500.0000 mg | ORAL_TABLET | Freq: Two times a day (BID) | ORAL | 0 refills | Status: AC

## 2019-04-14 MED ORDER — ERYTHROMYCIN BASE 500 MG PO TABS: ORAL_TABLET | ORAL | Status: AC

## 2019-04-14 NOTE — Progress Notes (Signed)
Pt refused medications and assessment. MD was notified. Mickel Baas RN printed discharge papers and gave them to patient. Patient did not want to review discharge instructions. Patient refused IV removal by nurse and removed it herself. Patient walked herself out.  04/14/2019 11:57 AM

## 2019-04-14 NOTE — Progress Notes (Signed)
Pt requested to speak with charge nurse for the unit. Charge nurse, Minette Brine, went in to speak with the pt. RN asked what was going on and pt reported that she hasn't received any medication since she has been here. RN asked if she was referring to her home medication and pt said yes. RN logged in and looked at her PTA med list and asked about some of the medications. Pt replied, "They are in there I don't need you to read me the list." RN started a chat with Dr. Billie Ruddy and pts primary nurse Jearld Lesch, RN requesting that the medications be restarted. RN informed pt of the request and pt told the RN, "you are just as worthless and the rest of the bitches that have been in here so you can bounce on out of here I think I'm just going to pull out this IV and head to [facility]." RN told pt she has that right and it would be considered leaving AMA and we have a form for her to sign. Pt stated, " you can't make me sign shit so bounce on out of here." RN reported to pt that she understood but wanted to inform her that there was a possibility of insurance not covering her stay if she was to leave AMA. Pt stated, "I'm gonna call them because I know they aren't going to cover it because this is bullshit." RN reported to primary RN and doctor of the interaction.

## 2019-04-14 NOTE — Discharge Summary (Addendum)
Physician Discharge Summary   Rhonda Brown  female DOB: 31-May-1965  [id number]  PCP: Trinna Post, PA-C  Admit date: 04/12/2019 Discharge date: 04/14/2019  Admitted From: home Disposition:  Home Pt was verbally abuse and yelling at me and nursing staff.  Pt said we had made up her pancreatitis dx and was upset that I had not fixed her main complaints of nausea/diarrhea (pre-existing chronic problem of hers) by hospital day 2, and wanted to leave and go to [facility].  Since pt was medically stable, she was discharged the regular way to seek medical attention elsewhere as she desired, and also to follow up with her regular outpatient GI doctor.  CODE STATUS: Full code  Discharge Instructions    Diet - low sodium heart healthy   Complete by: As directed    Discharge instructions   Complete by: As directed    Your CT scan showed "Mild, diffuse wall thickening and minimal adjacent fat stranding of the colon" that's non-specific.  Since you don't want to stay, and you are medically stable, I am discharging you with a empiric course of antibiotics to treat possible colitis, with Cipro and Flagyl, for 7 days.  Instructions included with the prescription.  Please follow up with your outpatient GI doctor.    - -   Increase activity slowly   Complete by: As directed        Hospital Course:  For full details, please see H&P, progress notes, consult notes and ancillary notes.  Briefly,  Rhonda Brown a 54 y.o.femalewith medical history significant ofcoronary artery disease status post previous stents, ongoing polysubstance use including tobacco and alcohol who has had binge drinking on and off including this past weekend, recurrent headache, anxiety disorder presented to the ER with epigastric pain radiating to the back rated as 9 out of 10 associated with nausea. Nausea and symptoms got worse with eating. She was unable to keep anything down. Patient had had diarrhea  in the last couple of days which then turned into constipation. She denied any fever or chills denied any sick contacts including COVID-19.   She was found to have elevated lipase but CT abdomen pelvis showed no evidence of inflamed pancreas. She did have umbilical hernia which was reduced.    #1 acute alcoholic pancreatitis, POA, resolved Lipase mildly elevated, already back to normal next day.  Pt received IVF hydration.  Counseling provided regarding alcohol use by admitting physician.  # Vomiting and diarrhea Symptoms didn't seem to all correlate with pancreatitis.  These symptoms occurred in the past periodically.  Pt reported a prior dx of gastroparesis.  Pt also has an outpatient GI doctor.  GI path obtained in the ED which was neg.  Pt was put on clear liquid diet and advanced.  Diarrhea noted to be resolved by nursing prior to discharge.  Pt was very upset that I had not fixed this pre-existing chronic problem of hers by hospital day 2, and wanted to leave and go to [facility].  Since pt was medically stable, she was discharged to seek medical attention elsewhere as she desired, and also to follow up with her regular outpatient GI doctor.  #2 hypokalemia: Repleted PRN.  #3 tobacco or marijuana use: Counseling provided by admitting physician. Patient offered nicotine patch.  #4 reported binge alcohol drinking: Counseling provided by admitting physician. Patient denied ongoing use. No withdrawal symptoms noted.  #5 coronary artery disease:Stable with no significant findings.  #6 Possible mild colitis  No signs of sepsis, no fever or leukocytosis, but given pt's complaints of unimproved nausea and diarrhea, am empiric course of oral Cipro/flagyl provided at discharge.  # umbilical hernia: Hernia was reduced in the ED.     Discharge Diagnoses:  Principal Problem:   Pancreatitis, acute Active Problems:   Tobacco abuse   Hyperlipidemia   CAD (coronary artery  disease)   Hypokalemia   Nausea and vomiting   Diarrhea   Marijuana smoker, continuous    Discharge Instructions:  Allergies as of 04/14/2019      Reactions   Cat Hair Extract    UNSPECIFIED REACTION       Medication List    TAKE these medications   acetaminophen 325 MG tablet Commonly known as: TYLENOL Take 650 mg by mouth every 6 (six) hours as needed for moderate pain.   aspirin 81 MG tablet Take 81 mg by mouth daily.   atorvastatin 80 MG tablet Commonly known as: LIPITOR Take 1 tablet (80 mg total) by mouth at bedtime. What changed: See the new instructions.   cetirizine 10 MG tablet Commonly known as: ZYRTEC Take 10 mg by mouth daily as needed for allergies. Patient take half tablet by mouth at bedtime   ciprofloxacin 500 MG tablet Commonly known as: Cipro Take 1 tablet (500 mg total) by mouth 2 (two) times daily for 7 days.   clopidogrel 75 MG tablet Commonly known as: PLAVIX TAKE 1 TABLET BY MOUTH EVERY DAY   cyclobenzaprine 10 MG tablet Commonly known as: FLEXERIL Take 1 tablet (10 mg total) by mouth 3 (three) times daily as needed for muscle spasms.   erythromycin base 500 MG tablet Commonly known as: E-MYCIN Hold while you are taking Cipro and Flagyl, and resume if your outpatient doctor instructs you to. What changed:   how much to take  how to take this  when to take this  additional instructions   isosorbide mononitrate 30 MG 24 hr tablet Commonly known as: IMDUR Take 30 mg by mouth daily.   metoprolol tartrate 25 MG tablet Commonly known as: LOPRESSOR Take 0.5 tablets (12.5 mg total) by mouth 2 (two) times daily. What changed:   how much to take  when to take this   metroNIDAZOLE 500 MG tablet Commonly known as: Flagyl Take 1 tablet (500 mg total) by mouth 3 (three) times daily for 7 days.   naproxen 500 MG tablet Commonly known as: Naprosyn Take 1 tablet (500 mg total) by mouth 2 (two) times daily as needed.   nicotine 14  mg/24hr patch Commonly known as: NICODERM CQ - dosed in mg/24 hours Place 14 mg onto the skin daily as needed (nicotine cravings).   nitroGLYCERIN 0.4 MG SL tablet Commonly known as: NITROSTAT Place 1 tablet (0.4 mg total) under the tongue every 5 (five) minutes as needed for chest pain.   pantoprazole 40 MG tablet Commonly known as: PROTONIX Take 40 mg by mouth daily.   ranolazine 1000 MG SR tablet Commonly known as: RANEXA Take 1 tablet (1,000 mg total) by mouth 2 (two) times daily.   topiramate 25 MG tablet Commonly known as: Topamax Take 25 mg QHS x 1 wk. Then take 50 mg x 1 wk. Then 75 mg x 1wk. Then 100 mg x 1 wk.       Follow-up Information    Trinna Post, PA-C. Schedule an appointment as soon as possible for a visit in 1 week(s).   Specialty: Physician Assistant Contact information: [phone]  [address] [place] [address] [phone]        Kate Sable, MD .   Specialties: Cardiology, Radiology Contact information: [place] [address] [phone]           Allergies  Allergen Reactions   Cat Hair Extract     UNSPECIFIED REACTION      The results of significant diagnostics from this hospitalization (including imaging, microbiology, ancillary and laboratory) are listed below for reference.   Consultations:   Procedures/Studies: CT ABDOMEN PELVIS W CONTRAST  Result Date: 04/12/2019 CLINICAL DATA:  Abdominal pain for 1 week, pain above the navel, diarrhea, vomiting EXAM: CT ABDOMEN AND PELVIS WITH CONTRAST TECHNIQUE: Multidetector CT imaging of the abdomen and pelvis was performed using the standard protocol following bolus administration of intravenous contrast. CONTRAST:  16mL OMNIPAQUE IOHEXOL 300 MG/ML  SOLN COMPARISON:  05/24/2016 FINDINGS: Lower chest: No acute abnormality. Hepatobiliary: No focal liver abnormality is seen. Hepatic steatosis. Status post cholecystectomy. No biliary dilatation.  Pancreas: Unremarkable. No pancreatic ductal dilatation or surrounding inflammatory changes. Spleen: Normal in size without significant abnormality. Adrenals/Urinary Tract: Adrenal glands are unremarkable. Kidneys are normal, without renal calculi, solid lesion, or hydronephrosis. Bladder is unremarkable. Stomach/Bowel: Stomach is within normal limits. The appendix is not clearly visualized and may be surgically absent. There is mild, diffuse wall thickening and minimal adjacent fat stranding of the colon, which is generally decompressed. There is fibrofatty mural stratification of the cecal base (series 5, image 42, 51, 37). Vascular/Lymphatic: Aortic atherosclerosis. No enlarged abdominal or pelvic lymph nodes. Reproductive: No mass or other significant abnormality. Other: There is a new fat containing midline ventral hernia above the umbilicus with associated fat stranding, measuring 3.8 x 3.2 cm with a neck measuring 1.9 cm (series 2, image 41). No abdominopelvic ascites. Musculoskeletal: No acute or significant osseous findings. IMPRESSION: 1. Mild, diffuse wall thickening and minimal adjacent fat stranding of the colon, which is generally decompressed, which may represent a nonspecific infectious or inflammatory colitis. There is some mural stratification of the cecal base, suggestive of chronic inflammation, raising suspicion for inflammatory bowel disease. 2. There is a new fat containing midline ventral hernia above the umbilicus with associated fat stranding, measuring 3.8 x 3.2 cm with a neck measuring 1.9 cm. Correlate for localized point tenderness given report of supraumbilical abdominal pain. No evidence of bowel involvement or obstruction. 3. Hepatic steatosis. 4. Aortic Atherosclerosis (ICD10-I70.0). Electronically Signed   By: Eddie Candle M.D.   On: 04/12/2019 18:40      Labs: BNP (last 3 results) No results for input(s): BNP in the last 8760 hours. Basic Metabolic Panel: Recent Labs    Lab 04/12/19 1546 04/13/19 0150 04/14/19 0542  NA 140 139 140  K 2.8* 3.2* 3.4*  CL 103 107 109  CO2 26 26 26   GLUCOSE 111* 114* 148*  BUN 7 7 <5*  CREATININE 0.77 0.83 0.80  CALCIUM 9.3 8.6* 8.7*  MG  --  1.9 1.9   Liver Function Tests: Recent Labs  Lab 04/12/19 1546 04/13/19 0150  AST 32 108*  ALT 25 52*  ALKPHOS 113 127*  BILITOT 0.8 1.1  PROT 7.7 6.3*  ALBUMIN 4.1 3.4*   Recent Labs  Lab 04/12/19 1546 04/13/19 0150  LIPASE 273* 39   No results for input(s): AMMONIA in the last 168 hours. CBC: Recent Labs  Lab 04/12/19 1546 04/13/19 0150 04/14/19 0542  WBC 10.6* 7.3 6.5  HGB 15.5* 13.3 13.1  HCT  47.7* 40.8 40.8  MCV 85.6 87.0 87.2  PLT 220 173 181   Cardiac Enzymes: No results for input(s): CKTOTAL, CKMB, CKMBINDEX, TROPONINI in the last 168 hours. BNP: Invalid input(s): POCBNP CBG: No results for input(s): GLUCAP in the last 168 hours. D-Dimer No results for input(s): DDIMER in the last 72 hours. Hgb A1c No results for input(s): HGBA1C in the last 72 hours. Lipid Profile No results for input(s): CHOL, HDL, LDLCALC, TRIG, CHOLHDL, LDLDIRECT in the last 72 hours. Thyroid function studies No results for input(s): TSH, T4TOTAL, T3FREE, THYROIDAB in the last 72 hours.  Invalid input(s): FREET3 Anemia work up No results for input(s): VITAMINB12, FOLATE, FERRITIN, TIBC, IRON, RETICCTPCT in the last 72 hours. Urinalysis    Component Value Date/Time   COLORURINE YELLOW (A) 04/12/2019 1546   APPEARANCEUR HAZY (A) 04/12/2019 1546   APPEARANCEUR Clear 09/03/2017 0908   LABSPEC 1.016 04/12/2019 1546   PHURINE 6.0 04/12/2019 1546   GLUCOSEU NEGATIVE 04/12/2019 1546   HGBUR NEGATIVE 04/12/2019 1546   BILIRUBINUR NEGATIVE 04/12/2019 1546   BILIRUBINUR Negative 09/03/2017 0908   KETONESUR NEGATIVE 04/12/2019 1546   PROTEINUR NEGATIVE 04/12/2019 1546   UROBILINOGEN 0.2 05/24/2008 0412   NITRITE NEGATIVE 04/12/2019 1546   LEUKOCYTESUR NEGATIVE  04/12/2019 1546   Sepsis Labs Invalid input(s): PROCALCITONIN,  WBC,  LACTICIDVEN Microbiology Recent Results (from the past 240 hour(s))  Respiratory Panel by RT PCR (Flu A&B, Covid) - Nasopharyngeal Swab     Status: None   Collection Time: 04/12/19  8:16 PM   Specimen: Nasopharyngeal Swab  Result Value Ref Range Status   SARS Coronavirus 2 by RT PCR NEGATIVE NEGATIVE Final    Comment: (NOTE) SARS-CoV-2 target nucleic acids are NOT DETECTED. The SARS-CoV-2 RNA is generally detectable in upper respiratoy specimens during the acute phase of infection. The lowest concentration of SARS-CoV-2 viral copies this assay can detect is 131 copies/mL. A negative result does not preclude SARS-Cov-2 infection and should not be used as the sole basis for treatment or other patient management decisions. A negative result may occur with  improper specimen collection/handling, submission of specimen other than nasopharyngeal swab, presence of viral mutation(s) within the areas targeted by this assay, and inadequate number of viral copies (<131 copies/mL). A negative result must be combined with clinical observations, patient history, and epidemiological information. The expected result is Negative. Fact Sheet for Patients:  PinkCheek.be Fact Sheet for Healthcare Providers:  GravelBags.it This test is not yet ap proved or cleared by the [place] FDA and  has been authorized for detection and/or diagnosis of SARS-CoV-2 by FDA under an Emergency Use Authorization (EUA). This EUA will remain  in effect (meaning this test can be used) for the duration of the COVID-19 declaration under Section 564(b)(1) of the Act, 21 U.S.C. section 360bbb-3(b)(1), unless the authorization is terminated or revoked sooner.    Influenza A by PCR NEGATIVE NEGATIVE Final   Influenza B by PCR NEGATIVE NEGATIVE Final    Comment: (NOTE) The Xpert Xpress  SARS-CoV-2/FLU/RSV assay is intended as an aid in  the diagnosis of influenza from Nasopharyngeal swab specimens and  should not be used as a sole basis for treatment. Nasal washings and  aspirates are unacceptable for Xpert Xpress SARS-CoV-2/FLU/RSV  testing. Fact Sheet for Patients: PinkCheek.be Fact Sheet for Healthcare Providers: GravelBags.it This test is not yet approved or cleared by the [place] FDA and  has been authorized for detection and/or diagnosis of SARS-CoV-2 by  FDA under an Emergency  Use Authorization (EUA). This EUA will remain  in effect (meaning this test can be used) for the duration of the  Covid-19 declaration under Section 564(b)(1) of the Act, 21  U.S.C. section 360bbb-3(b)(1), unless the authorization is  terminated or revoked. Performed at [facility], [address] [place], [place] [address]      Total time spend on discharging this patient, including the last patient exam, discussing the hospital stay, instructions for ongoing care as it relates to all pertinent caregivers, as well as preparing the medical discharge records, prescriptions, and/or referrals as applicable, is 40 minutes.    Enzo Bi, MD  [facility] 04/14/2019, 10:27 AM  If 7PM-7AM, please contact night-coverage

## 2019-04-15 ENCOUNTER — Telehealth: Payer: Self-pay

## 2019-04-15 NOTE — Telephone Encounter (Signed)
Patient was recently discharged from the hospital on 04/14/19.  No TCM completed, patient does not qualify for TCM services due to [employer] coverage.  Per discharge summary patient needs follow up with PCP. No apt scheduled at this time. FYI

## 2019-04-20 ENCOUNTER — Ambulatory Visit
Admission: RE | Admit: 2019-04-20 | Payer: BC Managed Care – PPO | Source: Ambulatory Visit | Admitting: Orthopedic Surgery

## 2019-04-20 ENCOUNTER — Telehealth: Payer: Self-pay | Admitting: Physician Assistant

## 2019-04-20 ENCOUNTER — Encounter: Admission: RE | Payer: Self-pay | Source: Ambulatory Visit

## 2019-04-20 SURGERY — ARTHROPLASTY, KNEE, TOTAL
Anesthesia: Choice | Site: Knee | Laterality: Left

## 2019-04-20 NOTE — Telephone Encounter (Signed)
I have received a fax from Raliegh Ip on 04/19/2019 stating that since the patient signed out AMA from her recent hospitalization, the orthopedist would like her to have a new surgical clearance prior to surgery. This is a request by her SURGEON and not myself as I had previously faxed a surgical clearance in 01/2019. If she would like to schedule this, she may feel free to do so. It would have to be in person.

## 2019-04-20 NOTE — Telephone Encounter (Signed)
Appt scheduled

## 2019-04-23 ENCOUNTER — Telehealth: Payer: Self-pay | Admitting: *Deleted

## 2019-04-23 DIAGNOSIS — R109 Unspecified abdominal pain: Secondary | ICD-10-CM | POA: Diagnosis not present

## 2019-04-23 DIAGNOSIS — R197 Diarrhea, unspecified: Secondary | ICD-10-CM | POA: Diagnosis not present

## 2019-04-23 DIAGNOSIS — R110 Nausea: Secondary | ICD-10-CM | POA: Diagnosis not present

## 2019-04-23 DIAGNOSIS — K429 Umbilical hernia without obstruction or gangrene: Secondary | ICD-10-CM | POA: Diagnosis not present

## 2019-04-23 NOTE — Telephone Encounter (Signed)
   [facility] Pre-operative Risk Assessment    Request for surgical clearance:  1. What type of surgery is being performed? COLONOSCOPY/ENDOSCOPY   2. When is this surgery scheduled? TBD   3. What type of clearance is required (medical clearance vs. Pharmacy clearance to hold med vs. Both)? MEDICAL  4. Are there any medications that need to be held prior to surgery and how long? PLAVIX X 5 DAYS PRIOR TO PROCEDURE   5. Practice name and name of physician performing surgery? [facility]; DR. Therisa Doyne   6. What is your office phone number [phone]    7.   What is your office fax number [phone]  8.   Anesthesia type (None, local, MAC, general) ? PROPOFOL   Rhonda Brown 04/23/2019, 4:37 PM  _________________________________________________________________   (provider comments below)

## 2019-04-26 NOTE — Telephone Encounter (Signed)
Left message for the patient to call back and speak to the on-call preop APP of the day 

## 2019-04-27 ENCOUNTER — Encounter: Payer: Self-pay | Admitting: Physician Assistant

## 2019-04-27 ENCOUNTER — Ambulatory Visit (INDEPENDENT_AMBULATORY_CARE_PROVIDER_SITE_OTHER): Payer: BC Managed Care – PPO | Admitting: Physician Assistant

## 2019-04-27 ENCOUNTER — Other Ambulatory Visit: Payer: Self-pay

## 2019-04-27 VITALS — BP 119/78 | HR 102 | Temp 96.0°F | Wt 184.4 lb

## 2019-04-27 DIAGNOSIS — R197 Diarrhea, unspecified: Secondary | ICD-10-CM

## 2019-04-27 NOTE — Telephone Encounter (Signed)
   Primary Cardiologist: Kate Sable, MD  Chart reviewed as part of pre-operative protocol coverage. Patient was contacted 04/27/2019 in reference to pre-operative risk assessment for pending surgery as outlined below.  Rhonda Brown was last seen on 01/29/2019 by Dr. Garen Lah.  Since that day, Rhonda Brown has done well without chest pain or dyspnea.  Therefore, based on ACC/AHA guidelines, the patient would be at acceptable risk for the planned procedure without further cardiovascular testing.   I will route this recommendation to the requesting party via Epic fax function and remove from pre-op pool.  Please call with questions.  Note, patient was previously cleared by Dr. Garen Lah for knee surgery in December 2020, however knee surgery was delayed due to GI issue.  Her overall symptom is unchanged compared to December.  She is cleared to hold Plavix for 5 days prior to the GI procedure and restart as soon as possible afterward.  [place], [place] 04/27/2019, 10:29 AM

## 2019-04-27 NOTE — Patient Instructions (Signed)
Ulcerative Colitis, Adult  Ulcerative colitis is long-lasting (chronic) inflammation of the large intestine (colon) and rectum. Sores (ulcers) may also form in these areas. Ulcerative colitis, along with a closely related condition called Crohn's disease, is often referred to as inflammatory bowel disease (IBD). What are the causes? This condition may be caused by increased activity of the immune system in the intestines. The immune system is the system that protects the body against harmful bacteria, viruses, fungi, and other things that can make you sick. The cause of the increased activity of the immune system is not known. What increases the risk? The following factors may make you more likely to develop this condition:  Being 15-40 years old. The risk is also increased for people who are 50-70 years old.  Having a family history of ulcerative colitis.  Being of Jewish descent. What are the signs or symptoms? Symptoms vary depending on how severe the condition is. Common symptoms include:  Rectal bleeding.  Diarrhea, often with blood or pus in the stool. Other symptoms can include:  Pain or cramping in the abdomen.  Fever.  Fatigue.  Weight loss.  Night sweats.  Rectal pain.  A strong and sudden need to have a bowel movement (bowel urgency).  Nausea.  Loss of appetite.  Anemia.  Yellowing of the skin (jaundice) from liver dysfunction.  Joint pain or soreness.  Eye irritation.  Skin rashes. Symptoms can range from mild to severe. They may come and go. How is this diagnosed? This condition may be diagnosed based on:  Your symptoms and medical history.  A physical exam.  Tests, including: ? Blood tests and stool tests. ? X-ray. ? A CT scan. ? An MRI. ? Colonoscopy. For this test, a flexible tube is inserted into your anus, and your colon is examined. ? Biopsy. In this test, a tissue sample is taken from your colon and examined under a microscope. How  is this treated? Treatment for this condition may include medicines to:  Decrease swelling and inflammation.  Control your immune system.  Treat infections.  Relieve pain.  Control diarrhea. Severe flare-ups may need to be treated at a hospital. Treatment in a hospital may involve:  Resting the bowel. This involves not eating or drinking for a period of time.  Getting medicines through an IV.  Getting fluids and nutrition through: ? An IV. ? A tube that is passed through the nose and into the stomach (nasogastric tube, or NG tube).  Surgery to remove the affected part of the colon. This may be done if other treatments are not helping. This condition increases the risk of colon cancer. Adults with this condition will need to be watched for colon cancer throughout life. Follow these instructions at home: Medicines and vitamins  Take over-the-counter and prescription medicines only as told by your health care provider. Do not take aspirin.  If you were prescribed an antibiotic medicine, take it as told by your health care provider. Do not stop taking the antibiotic even if you start to feel better.  Ask your health care provider if you should take any vitamins or supplements. You may need to take: ? Calcium and vitamin D for bone health. ? Iron to help treat anemia. Lifestyle  Exercise regularly.  Work with your health care provider to manage your condition and educate yourself about your condition.  Do not use any products that contain nicotine or tobacco, such as cigarettes, e-cigarettes, and chewing tobacco. If you need help quitting,   ask your health care provider.  If you drink alcohol: ? Limit how much you use to:  0-1 drink a day for women.  0-2 drinks a day for men. ? Be aware of how much alcohol is in your drink. In the [place] one drink equals one 12 oz bottle of beer (355 mL), one 5 oz glass of wine (148 mL), or one 1 oz glass of hard liquor (44 mL). Eating and  drinking  Drink enough fluid to keep your urine pale yellow.  Ask your health care provider about the best diet for you. Follow the diet as told by your health care provider. This may include: ? Avoiding carbonated drinks. ? Avoiding popcorn, vegetable skins, nuts, and other high-fiber foods. ? Avoiding high-fat foods. ? Eating smaller meals more often. ? Limiting your intake of sugary drinks. ? Limiting your caffeine intake.  Follow food safety recommendations as told by your health care provider. This may include making sure you: ? Avoid eating raw or undercooked meat, fish, or eggs. ? Do not eat or drink spoiled or expired foods and drinks.  Keep a food diary. This may help you identify and avoid any foods that trigger your symptoms. General instructions  Wash your hands often with soap and water. If soap and water are not available, use hand sanitizer.  Stay up to date on your vaccinations, including a yearly (annual) flu shot. Ask your health care provider which vaccines you should get.  Follow recommendations from your health care provider for having cancer screening tests. Ulcerative colitis may place you at increased risk for colon cancer.  Keep all follow-up visits as told by your health care provider. This is important. Contact a health care provider if:  Your symptoms do not improve or they get worse with treatment.  You continue to lose weight.  You have constant cramps or loose stools.  You develop a new skin rash, skin sores, or eye problems.  You have a fever or chills. Get help right away if:  You have bloody diarrhea.  You have severe bleeding from the rectum.  You feel that your heart is racing (tachycardia).  You have severe pain in your abdomen.  Your abdomen swells (abdominal distension).  Your abdomen is tender to the touch.  You vomit. Summary  Ulcerative colitis is long-lasting (chronic) inflammation of the large intestine (colon) and  rectum. Sores (ulcers) may also form in these areas.  Follow instructions from your health care provider about medicines, lifestyle changes, and eating and drinking.  Contact your health care provider if symptoms do not improve or they get worse with treatment.  Get help right away if you have severe abdominal pain, abdominal swelling, or severe bleeding from the rectum.  Keep all follow-up visits as told by your health care provider. This is important. This information is not intended to replace advice given to you by your health care provider. Make sure you discuss any questions you have with your health care provider. Document Revised: 12/01/2017 Document Reviewed: 12/03/2017 Elsevier Patient Education  2020 [employer]  

## 2019-04-27 NOTE — Progress Notes (Signed)
Patient: Rhonda Brown Female    DOB: [birth year]   54 y.o.   MRN: [id number] Visit Date: 04/27/2019  Today's Provider: Trinna Post, PA-C   Chief Complaint  Patient presents with  . Abdominal Pain   Subjective:     Abdominal Pain This is a new problem. The current episode started 1 to 4 weeks ago. The problem occurs intermittently. The problem has been unchanged. The quality of the pain is aching. Associated symptoms include diarrhea, nausea and vomiting. Pertinent negatives include no belching or constipation. The pain is aggravated by eating.    She was admitted to [facility] on 04/12/2019 and discharged on 04/14/2019. She was treated for pancreatitis. She had an abdominal CT scan showing a fat containing ventral hernia and also thickening of her colon that could represent chronic inflammation and possible inflammatory disease. Prior to discharge her lipase normalized. She is having frequent diarrhea. She reports she had a virtual visit on Friday with [facility] and she is scheduled for a colonoscopy and endoscopy on 05/24/2019. Her orthopedist is requiring her to get new clearance due to recent hospitalization.    Allergies  Allergen Reactions  . Cat Hair Extract     UNSPECIFIED REACTION      Current Outpatient Medications:  .  acetaminophen (TYLENOL) 325 MG tablet, Take 650 mg by mouth every 6 (six) hours as needed for moderate pain. , Disp: , Rfl:  .  aspirin 81 MG tablet, Take 81 mg by mouth daily. , Disp: , Rfl:  .  atorvastatin (LIPITOR) 80 MG tablet, Take 1 tablet (80 mg total) by mouth at bedtime., Disp: 90 tablet, Rfl: 2 .  cetirizine (ZYRTEC) 10 MG tablet, Take 10 mg by mouth daily as needed for allergies. Patient take half tablet by mouth at bedtime , Disp: , Rfl:  .  clopidogrel (PLAVIX) 75 MG tablet, TAKE 1 TABLET BY MOUTH EVERY DAY (Patient taking differently: Take 75 mg by mouth daily. ), Disp: 90 tablet, Rfl: 3 .  cyclobenzaprine (FLEXERIL) 10 MG tablet, Take 1  tablet (10 mg total) by mouth 3 (three) times daily as needed for muscle spasms., Disp: 15 tablet, Rfl: 0 .  erythromycin base (E-MYCIN) 500 MG tablet, Hold while you are taking Cipro and Flagyl, and resume if your outpatient doctor instructs you to., Disp: , Rfl:  .  isosorbide mononitrate (IMDUR) 30 MG 24 hr tablet, Take 30 mg by mouth daily., Disp: , Rfl:  .  metoprolol tartrate (LOPRESSOR) 25 MG tablet, Take 0.5 tablets (12.5 mg total) by mouth 2 (two) times daily. (Patient taking differently: Take 25 mg by mouth at bedtime. ), Disp: 180 tablet, Rfl: 3 .  naproxen (NAPROSYN) 500 MG tablet, Take 1 tablet (500 mg total) by mouth 2 (two) times daily as needed., Disp: , Rfl:  .  nicotine (NICODERM CQ - DOSED IN MG/24 HOURS) 14 mg/24hr patch, Place 14 mg onto the skin daily as needed (nicotine cravings). , Disp: , Rfl:  .  nitroGLYCERIN (NITROSTAT) 0.4 MG SL tablet, Place 1 tablet (0.4 mg total) under the tongue every 5 (five) minutes as needed for chest pain., Disp: 75 tablet, Rfl: 2 .  pantoprazole (PROTONIX) 40 MG tablet, Take 40 mg by mouth daily., Disp: , Rfl:  .  ranolazine (RANEXA) 1000 MG SR tablet, Take 1 tablet (1,000 mg total) by mouth 2 (two) times daily., Disp: 90 each, Rfl: 3 .  topiramate (TOPAMAX) 25 MG tablet, Take 25 mg QHS  x 1 wk. Then take 50 mg x 1 wk. Then 75 mg x 1wk. Then 100 mg x 1 wk., Disp: 180 tablet, Rfl: 1  Review of Systems  Gastrointestinal: Positive for abdominal pain, diarrhea, nausea and vomiting. Negative for constipation.    Social History   Tobacco Use  . Smoking status: Current Some Day Smoker    Types: Cigarettes  . Smokeless tobacco: Never Used  . Tobacco comment: smokes 2 cigarettes a day  Substance Use Topics  . Alcohol use: Yes      Objective:   BP 119/78 (BP Location: Left Arm, Patient Position: Sitting, Cuff Size: Normal)   Pulse (!) 102   Temp (!) 96 F (35.6 C) (Temporal)   Wt 184 lb 6.4 oz (83.6 kg)   BMI 36.01 kg/m  Vitals:    04/27/19 1050  BP: 119/78  Pulse: (!) 102  Temp: (!) 96 F (35.6 C)  TempSrc: Temporal  Weight: 184 lb 6.4 oz (83.6 kg)  Body mass index is 36.01 kg/m.   Physical Exam Constitutional:      Appearance: She is well-developed.  Cardiovascular:     Rate and Rhythm: Normal rate and regular rhythm.  Pulmonary:     Effort: Pulmonary effort is normal.     Breath sounds: Normal breath sounds.  Abdominal:     General: Abdomen is flat. Bowel sounds are normal.  Skin:    General: Skin is warm and dry.  Neurological:     Mental Status: She is alert and oriented to person, place, and time.  Psychiatric:        Mood and Affect: Mood normal.        Behavior: Behavior normal.      No results found for any visits on 04/27/19.     Assessment & Plan    1. Diarrhea, unspecified type  Her lipase has normalized prior to discharge. She is undergoing colonoscopy and endoscopy with [facility]. Will have her sign ROI so we can request records from prior assessment. Will fax note to Ortho stating her clearance is dependent on GI doctor. She has been cleared by cardiology again.   The entirety of the information documented in the History of Present Illness, Review of Systems and Physical Exam were personally obtained by me. Portions of this information were initially documented by [facility] and reviewed by me for thoroughness and accuracy.         Trinna Post, PA-C  [facility]

## 2019-05-03 ENCOUNTER — Encounter (HOSPITAL_COMMUNITY): Admission: RE | Admit: 2019-05-03 | Payer: BC Managed Care – PPO | Source: Ambulatory Visit

## 2019-05-03 ENCOUNTER — Encounter (HOSPITAL_COMMUNITY): Payer: BC Managed Care – PPO

## 2019-05-11 ENCOUNTER — Ambulatory Visit (HOSPITAL_COMMUNITY)
Admission: RE | Admit: 2019-05-11 | Payer: BC Managed Care – PPO | Source: Ambulatory Visit | Admitting: Orthopedic Surgery

## 2019-05-11 ENCOUNTER — Encounter (HOSPITAL_COMMUNITY): Admission: RE | Payer: Self-pay | Source: Ambulatory Visit

## 2019-05-11 DIAGNOSIS — Z01812 Encounter for preprocedural laboratory examination: Secondary | ICD-10-CM | POA: Diagnosis not present

## 2019-05-11 SURGERY — ARTHROPLASTY, KNEE, TOTAL
Anesthesia: Choice | Site: Knee | Laterality: Left

## 2019-05-12 NOTE — Telephone Encounter (Signed)
Attempted to schedule no ans no vm  

## 2019-05-14 DIAGNOSIS — K635 Polyp of colon: Secondary | ICD-10-CM | POA: Diagnosis not present

## 2019-05-14 DIAGNOSIS — K2930 Chronic superficial gastritis without bleeding: Secondary | ICD-10-CM | POA: Diagnosis not present

## 2019-05-14 DIAGNOSIS — R197 Diarrhea, unspecified: Secondary | ICD-10-CM | POA: Diagnosis not present

## 2019-05-14 DIAGNOSIS — K621 Rectal polyp: Secondary | ICD-10-CM | POA: Diagnosis not present

## 2019-05-14 DIAGNOSIS — R1084 Generalized abdominal pain: Secondary | ICD-10-CM | POA: Diagnosis not present

## 2019-05-14 DIAGNOSIS — K3189 Other diseases of stomach and duodenum: Secondary | ICD-10-CM | POA: Diagnosis not present

## 2019-05-14 DIAGNOSIS — D124 Benign neoplasm of descending colon: Secondary | ICD-10-CM | POA: Diagnosis not present

## 2019-05-14 DIAGNOSIS — R933 Abnormal findings on diagnostic imaging of other parts of digestive tract: Secondary | ICD-10-CM | POA: Diagnosis not present

## 2019-05-14 DIAGNOSIS — R110 Nausea: Secondary | ICD-10-CM | POA: Diagnosis not present

## 2019-05-14 LAB — HM COLONOSCOPY

## 2019-05-31 NOTE — Telephone Encounter (Signed)
Attempted to schedule.  LMOV to call office.  ° °

## 2019-06-23 NOTE — Telephone Encounter (Signed)
3 attempts to schedule fu appt from recall list.   Deleting recall.   

## 2019-06-25 DIAGNOSIS — Z09 Encounter for follow-up examination after completed treatment for conditions other than malignant neoplasm: Secondary | ICD-10-CM | POA: Diagnosis not present

## 2019-07-14 ENCOUNTER — Telehealth: Payer: Self-pay | Admitting: Physician Assistant

## 2019-07-14 NOTE — Telephone Encounter (Signed)
-----   Message from Neva Seat sent at 07/14/2019  2:48 PM EDT ----- Please review incoming fax.

## 2019-07-14 NOTE — Telephone Encounter (Signed)
Can we request her colonoscopy from [facility]? Thanks.

## 2019-07-15 NOTE — Telephone Encounter (Signed)
Request faxed to [facility] for colonoscopy.

## 2019-08-26 ENCOUNTER — Ambulatory Visit: Payer: Self-pay | Admitting: Physician Assistant

## 2019-08-26 DIAGNOSIS — M1712 Unilateral primary osteoarthritis, left knee: Secondary | ICD-10-CM | POA: Diagnosis not present
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
## 2019-09-01 NOTE — Patient Instructions (Addendum)
DUE TO COVID-19 ONLY ONE VISITOR IS ALLOWED TO COME WITH YOU AND STAY IN THE WAITING ROOM ONLY DURING PRE OP AND PROCEDURE DAY OF SURGERY. THE 2 VISITORS  MAY VISIT WITH YOU AFTER SURGERY IN YOUR PRIVATE ROOM DURING VISITING HOURS ONLY!  YOU NEED TO HAVE A COVID 19 TEST ON__7/16_____ @_2 :45______, THIS TEST MUST BE DONE BEFORE SURGERY, COME  [address], [place] , [address].  ([place]) ONCE YOUR COVID TEST IS COMPLETED, PLEASE BEGIN THE QUARANTINE INSTRUCTIONS AS OUTLINED IN YOUR HANDOUT.                Rhonda Brown    Your procedure is scheduled on: 09/14/19   Report to [facility] Main  Entrance   Report to admitting at   7:25 AM     Call this number if you have problems the morning of surgery [place], NO [place].   Do not eat food After Midnight.   YOU MAY HAVE CLEAR LIQUIDS FROM MIDNIGHT UNTIL 6:30 AM.   At 6:30 AM Please finish the prescribed Pre-Surgery  drink.   Nothing by mouth after you finish the  drink !   Take these medicines the morning of surgery with A SIP OF WATER: Isosorbide Mononitrate, Ranolazine                                 You may not have any metal on your body including hair pins and              piercings  Do not wear jewelry, make-up, lotions, powders or perfumes, deodorant             Do not wear nail polish on your fingernails.  Do not shave  48 hours prior to surgery.             Do not bring valuables to the hospital. [place].  Contacts, dentures or bridgework may not be worn into surgery.       Patients discharged the day of surgery will not be allowed to drive home.   IF YOU ARE HAVING SURGERY AND GOING HOME THE SAME DAY, YOU MUST HAVE AN ADULT TO DRIVE YOU HOME AND BE WITH YOU FOR 24 HOURS.   YOU MAY GO HOME BY TAXI OR UBER OR ORTHERWISE, BUT AN ADULT MUST  ACCOMPANY YOU HOME AND STAY WITH YOU FOR 24 HOURS.  Name and phone number of your driver:  Special Instructions: N/A              Please read over the following fact sheets you were given: _____________________________________________________________________             [facility] - Preparing for Surgery Before surgery, you can play an important role.   Because skin is not sterile, your skin needs to be as free of germs as possible.   You can reduce the number of germs on your skin by washing with CHG (chlorahexidine gluconate) soap before surgery.   CHG is an antiseptic cleaner which kills germs and bonds with the skin to continue killing germs even after washing. Please DO NOT use if you have an allergy to CHG or antibacterial soaps.  If your skin becomes reddened/irritated stop using the CHG and inform your nurse when you arrive at Short Stay. Do not shave (including legs and underarms) for at least 48 hours prior to the first CHG shower.  . Please follow these instructions carefully:  1.  Shower with CHG Soap the night before surgery and the  morning of Surgery.  2.  If you choose to wash your hair, wash your hair first as usual with your  normal  shampoo.  3.  After you shampoo, rinse your hair and body thoroughly to remove the  shampoo.                                        4.  Use CHG as you would any other liquid soap.  You can apply chg directly  to the skin and wash                       Gently with a scrungie or clean washcloth.  5.  Apply the CHG Soap to your body ONLY FROM THE NECK DOWN.   Do not use on face/ open                           Wound or open sores. Avoid contact with eyes, ears mouth and genitals (private parts).                       Wash face,  Genitals (private parts) with your normal soap.             6.  Wash thoroughly, paying special attention to the area where your surgery  will be performed.  7.  Thoroughly rinse your body with warm water from the  neck down.  8.  DO NOT shower/wash with your normal soap after using and rinsing off  the CHG Soap.             9.  Pat yourself dry with a clean towel.            10.  Wear clean pajamas.            11.  Place clean sheets on your bed the night of your first shower and do not  sleep with pets. Day of Surgery : Do not apply any lotions/deodorants the morning of surgery.  Please wear clean clothes to the hospital/surgery center.  FAILURE TO FOLLOW THESE INSTRUCTIONS MAY RESULT IN THE CANCELLATION OF YOUR SURGERY PATIENT SIGNATURE_________________________________  NURSE SIGNATURE__________________________________  ________________________________________________________________________   Rhonda Brown  An incentive spirometer is a tool that can help keep your lungs clear and active. This tool measures how well you are filling your lungs with each breath. Taking long deep breaths may help reverse or decrease the chance of developing breathing (pulmonary) problems (especially infection) following:  A long period of time when you are unable to move or be active. BEFORE THE PROCEDURE   If the spirometer includes an indicator to show your best effort, your nurse or respiratory therapist will set it to a desired goal.  If possible, sit up straight or lean slightly forward. Try not to slouch.  Hold the incentive spirometer in an upright position. INSTRUCTIONS FOR USE  1. Sit on the edge of your bed if possible, or sit up as far as you  can in bed or on a chair. 2. Hold the incentive spirometer in an upright position. 3. Breathe out normally. 4. Place the mouthpiece in your mouth and seal your lips tightly around it. 5. Breathe in slowly and as deeply as possible, raising the piston or the ball toward the top of the column. 6. Hold your breath for 3-5 seconds or for as long as possible. Allow the piston or ball to fall to the bottom of the column. 7. Remove the mouthpiece from your  mouth and breathe out normally. 8. Rest for a few seconds and repeat Steps 1 through 7 at least 10 times every 1-2 hours when you are awake. Take your time and take a few normal breaths between deep breaths. 9. The spirometer may include an indicator to show your best effort. Use the indicator as a goal to work toward during each repetition. 10. After each set of 10 deep breaths, practice coughing to be sure your lungs are clear. If you have an incision (the cut made at the time of surgery), support your incision when coughing by placing a pillow or rolled up towels firmly against it. Once you are able to get out of bed, walk around indoors and cough well. You may stop using the incentive spirometer when instructed by your caregiver.  RISKS AND COMPLICATIONS  Take your time so you do not get dizzy or light-headed.  If you are in pain, you may need to take or ask for pain medication before doing incentive spirometry. It is harder to take a deep breath if you are having pain. AFTER USE  Rest and breathe slowly and easily.  It can be helpful to keep track of a log of your progress. Your caregiver can provide you with a simple table to help with this. If you are using the spirometer at home, follow these instructions: [place] IF:   You are having difficultly using the spirometer.  You have trouble using the spirometer as often as instructed.  Your pain medication is not giving enough relief while using the spirometer.  You develop fever of 100.5 F (38.1 C) or higher. SEEK IMMEDIATE MEDICAL CARE IF:   You cough up bloody sputum that had not been present before.  You develop fever of 102 F (38.9 C) or greater.  You develop worsening pain at or near the incision site. MAKE SURE YOU:   Understand these instructions.  Will watch your condition.  Will get help right away if you are not doing well or get worse. Document Released: 06/24/2006 Document Revised: 05/06/2011  Document Reviewed: 08/25/2006 [facility] Patient Information 2014 [place], [place].   ________________________________________________________________________

## 2019-09-02 ENCOUNTER — Encounter (HOSPITAL_COMMUNITY)
Admission: RE | Admit: 2019-09-02 | Discharge: 2019-09-02 | Disposition: A | Discharge status: Home / Self Care | Payer: BC Managed Care – PPO | Source: Ambulatory Visit | Attending: Orthopedic Surgery | Admitting: Orthopedic Surgery

## 2019-09-02 ENCOUNTER — Other Ambulatory Visit: Payer: Self-pay

## 2019-09-02 ENCOUNTER — Encounter (HOSPITAL_COMMUNITY): Payer: Self-pay

## 2019-09-02 DIAGNOSIS — Z7901 Long term (current) use of anticoagulants: Secondary | ICD-10-CM | POA: Insufficient documentation

## 2019-09-02 DIAGNOSIS — E785 Hyperlipidemia, unspecified: Secondary | ICD-10-CM | POA: Insufficient documentation

## 2019-09-02 DIAGNOSIS — Z01812 Encounter for preprocedural laboratory examination: Secondary | ICD-10-CM | POA: Diagnosis not present

## 2019-09-02 DIAGNOSIS — F17210 Nicotine dependence, cigarettes, uncomplicated: Secondary | ICD-10-CM | POA: Insufficient documentation

## 2019-09-02 DIAGNOSIS — Z79899 Other long term (current) drug therapy: Secondary | ICD-10-CM | POA: Insufficient documentation

## 2019-09-02 DIAGNOSIS — Z7902 Long term (current) use of antithrombotics/antiplatelets: Secondary | ICD-10-CM | POA: Diagnosis not present

## 2019-09-02 DIAGNOSIS — Z7982 Long term (current) use of aspirin: Secondary | ICD-10-CM | POA: Diagnosis not present

## 2019-09-02 DIAGNOSIS — I252 Old myocardial infarction: Secondary | ICD-10-CM | POA: Diagnosis not present

## 2019-09-02 DIAGNOSIS — M1712 Unilateral primary osteoarthritis, left knee: Secondary | ICD-10-CM | POA: Diagnosis not present

## 2019-09-02 DIAGNOSIS — I2510 Atherosclerotic heart disease of native coronary artery without angina pectoris: Secondary | ICD-10-CM | POA: Insufficient documentation

## 2019-09-02 LAB — CBC WITH DIFFERENTIAL/PLATELET
Abs Immature Granulocytes: 0.03 10*3/uL (ref 0.00–0.07)
Basophils Absolute: 0 10*3/uL (ref 0.0–0.1)
Basophils Relative: 0 %
Eosinophils Absolute: 0.5 10*3/uL (ref 0.0–0.5)
Eosinophils Relative: 5 %
HCT: 47.9 % — ABNORMAL HIGH (ref 36.0–46.0)
Hemoglobin: 15.3 g/dL — ABNORMAL HIGH (ref 12.0–15.0)
Immature Granulocytes: 0 %
Lymphocytes Relative: 29 %
Lymphs Abs: 2.7 10*3/uL (ref 0.7–4.0)
MCH: 28.3 pg (ref 26.0–34.0)
MCHC: 31.9 g/dL (ref 30.0–36.0)
MCV: 88.5 fL (ref 80.0–100.0)
Monocytes Absolute: 0.7 10*3/uL (ref 0.1–1.0)
Monocytes Relative: 7 %
Neutro Abs: 5.5 10*3/uL (ref 1.7–7.7)
Neutrophils Relative %: 59 %
Platelets: 201 10*3/uL (ref 150–400)
RBC: 5.41 MIL/uL — ABNORMAL HIGH (ref 3.87–5.11)
RDW: 15.1 % (ref 11.5–15.5)
WBC: 9.4 10*3/uL (ref 4.0–10.5)
nRBC: 0 % (ref 0.0–0.2)

## 2019-09-02 LAB — COMPREHENSIVE METABOLIC PANEL
ALT: 20 U/L (ref 0–44)
AST: 19 U/L (ref 15–41)
Albumin: 3.9 g/dL (ref 3.5–5.0)
Alkaline Phosphatase: 86 U/L (ref 38–126)
Anion gap: 9 (ref 5–15)
BUN: 7 mg/dL (ref 6–20)
CO2: 27 mmol/L (ref 22–32)
Calcium: 9.2 mg/dL (ref 8.9–10.3)
Chloride: 106 mmol/L (ref 98–111)
Creatinine, Ser: 0.92 mg/dL (ref 0.44–1.00)
GFR calc Af Amer: >60 mL/min (ref >60)
GFR calc non Af Amer: >60 mL/min (ref >60)
Glucose, Bld: 99 mg/dL (ref 70–99)
Potassium: 3.5 mmol/L (ref 3.5–5.1)
Sodium: 142 mmol/L (ref 135–145)
Total Bilirubin: 0.9 mg/dL (ref 0.3–1.2)
Total Protein: 7.4 g/dL (ref 6.5–8.1)

## 2019-09-02 LAB — TYPE AND SCREEN
ABO/RH(D): A POS
Antibody Screen: NEGATIVE

## 2019-09-02 LAB — URINALYSIS, ROUTINE W REFLEX MICROSCOPIC
Bilirubin Urine: NEGATIVE
Glucose, UA: NEGATIVE mg/dL
Ketones, ur: NEGATIVE mg/dL
Nitrite: NEGATIVE
Protein, ur: NEGATIVE mg/dL
Specific Gravity, Urine: 1.013 (ref 1.005–1.030)
pH: 6 (ref 5.0–8.0)

## 2019-09-02 LAB — SURGICAL PCR SCREEN
MRSA, PCR: NEGATIVE
Staphylococcus aureus: POSITIVE — AB

## 2019-09-02 LAB — PROTIME-INR
INR: 1 (ref 0.8–1.2)
Prothrombin Time: 13.1 seconds (ref 11.4–15.2)

## 2019-09-02 LAB — APTT: aPTT: 35 seconds (ref 24–36)

## 2019-09-02 NOTE — Progress Notes (Signed)
COVID Vaccine Completed:No Date COVID Vaccine completed: COVID vaccine manufacturer: Pfizer    Moderna   [employer]   PCP - Glenna Fellows PA Cardiologist -Dr. Jacinto Reap. Ag[place]ang   Chest x-ray - 01/17/19 EKG - 01/29/19 Stress Test - 04/15/17 ECHO - 03/25/18 Cardiac Cath - 2017.2018  Sleep Study - no CPAP -   Fasting Blood Sugar -NA  Checks Blood Sugar _____ times a day  Blood Thinner Instructions:ASA and Plavix. Dr. Percell Miller said stop 5 days "7days are better" prior to DOS Aspirin Instructions: Last Dose:09/07/19  Anesthesia review:   Patient denies shortness of breath, fever, cough and chest pain at PAT appointment yes   Patient verbalized understanding of instructions that were given to them at the PAT appointment. Patient was also instructed that they will need to review over the PAT instructions again at home before surgery.yes  Pt does have SOB sometimes but is able to do ADLs without SOB. She can climb 1 flight of stairs she has 60-65 EF.

## 2019-09-03 LAB — URINE CULTURE

## 2019-09-03 NOTE — Progress Notes (Signed)
PCR results sent to Dr. Percell Miller for review

## 2019-09-03 NOTE — Progress Notes (Signed)
Urine culture sent to Dr. Percell Miller to review.

## 2019-09-06 ENCOUNTER — Telehealth: Payer: Self-pay

## 2019-09-06 NOTE — Telephone Encounter (Signed)
Advised patient as below.  

## 2019-09-06 NOTE — Telephone Encounter (Signed)
Please review. Thanks!  

## 2019-09-06 NOTE — Telephone Encounter (Signed)
No need for OV 

## 2019-09-06 NOTE — Telephone Encounter (Signed)
Copied from [facility] #[id number]. Topic: General - Other >> Sep 06, 2019 10:11 AM Marya Landry D wrote: Reason for CRM: Patient would like for her pcp to review her recent lab results processed at [facility] for a pre-op surgery an decide whether she thinks she needs to be seen in office.please advise

## 2019-09-07 ENCOUNTER — Encounter (HOSPITAL_COMMUNITY): Payer: Self-pay | Admitting: Physician Assistant

## 2019-09-07 ENCOUNTER — Encounter (HOSPITAL_COMMUNITY): Payer: Self-pay | Admitting: Anesthesiology
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
## 2019-09-10 ENCOUNTER — Inpatient Hospital Stay (HOSPITAL_COMMUNITY): Admission: RE | Admit: 2019-09-10 | Payer: BC Managed Care – PPO | Source: Ambulatory Visit

## 2019-09-13 MED ORDER — BUPIVACAINE LIPOSOME 1.3 % IJ SUSP
20.0000 mL | INTRAMUSCULAR | Status: DC
  Filled 2019-09-13: qty 20

## 2019-09-13 NOTE — Progress Notes (Signed)
I called Pt to let her know of time to arrive for surgery on 09/14/19 there was no answer again so I called Ander Gaster who is her contact person. She told me that Tamala thought her surgery was canceled. I checked on the surgery schedule and it was still on. I told Suanne Marker to let her know that she was still on the surgery schedule and she needs to arrive at 5:00 am in order to get a rapid covid test because she missed it.Suanne Marker said she will tell Ladelle.  I left her with my number and said she could verify it with Dr.Murphy.

## 2019-09-13 NOTE — Progress Notes (Signed)
Called patient mobile number again. No answer.  Call Pt. Emergency contact Rhonda Brown) and she got in touch with Rhonda Brown; who called back and informed staff that she had cancelled her surgery due to a family emergency- she had called and left message with Dr. Percell Miller office (Rhonda Brown) and informed that she would need to reschedule surgery.

## 2019-09-13 NOTE — Progress Notes (Signed)
Pt called with surgery time change. Message left.

## 2019-09-13 NOTE — Anesthesia Preprocedure Evaluation (Deleted)
Anesthesia Evaluation    Reviewed: Allergy & Precautions, Patient's Chart, lab work & pertinent test results  Airway        Dental   Pulmonary Current Smoker and Patient abstained from smoking.,           Cardiovascular + angina + CAD, + Past MI and + Cardiac Stents    EKG: 01/29/2019 Rate 78 bpm  Normal sinus rhythm   Myocardial Perfusion 03/25/2018  There was no ST segment deviation noted during stress.  T wave inversion was noted during stress in the II, III, V3, V2, V4 and V5 leads. During infusion  Nuclear stress EF: 68%. No wall motion abnormalities  There were no perfusion defects at stress and at rest. No ischemia, no infarcts.  This is a low risk study.  Echo 03/25/2018 The left ventricle appears to be normal in size, has normal wall  thickness 60-65% ejection fraction Spectral Doppler shows nondiagnostic  pattern of diastolic filling.  2. Normal Regional wall motion.  3. Right ventricular systolic pressure is is normal.  4. The right ventricle has normal size and normal systolic function.  5. Normal left atrial size.  6. Normal right atrial size.  7. Mitral valve regurgitation is mild by color flow Doppler.  8. The mitral valve normal in structure and function.  9. Normal tricuspid valve.  10. Aortic valve normal.  11. No atrial level shunt detected by color flow Doppler.  12. The interatrial septum appears to be lipomatous.     Neuro/Psych  Headaches,    GI/Hepatic   Endo/Other    Renal/GU   negative genitourinary   Musculoskeletal  (+) Arthritis ,   Abdominal   Peds negative pediatric ROS (+)  Hematology   Anesthesia Other Findings   Reproductive/Obstetrics negative OB ROS                             Anesthesia Physical Anesthesia Plan  ASA: III  Anesthesia Plan: Spinal   Post-op Pain Management:    Induction:   PONV Risk Score and Plan:    Airway Management Planned:   Additional Equipment:   Intra-op Plan:   Post-operative Plan:   Informed Consent:   Plan Discussed with: Anesthesiologist and CRNA  Anesthesia Plan Comments: (54 y.o. current some day smoker (3.75 pack years) with h/o HLD, CAD (DES to LAD 01/2016), left knee OA scheduled for above procedure 09/14/2019 with Dr. Edmonia Lynch.    Per cardiology preoperative risk assessment 04/27/2019, "Chart reviewed as part of pre-operative protocol coverage. Patient was contacted3/2/2021in reference to pre-operative risk assessment for pending surgery as outlined below. Christinia Lambeth last seen on 12/04/2020by Dr. Garen Lah. Since that day, Krithi Bray done well without chest pain or dyspnea. Therefore, based on ACC/AHA guidelines, the patient would be at acceptable risk for the planned procedure without further cardiovascular testing.")        Anesthesia Quick Evaluation

## 2019-09-13 NOTE — Progress Notes (Signed)
Called and left message to verify that she was called earlier today about time change for OR. To be here at 0530 instead of 0730.  Message left on patient mobile/home number

## 2019-09-14 ENCOUNTER — Ambulatory Visit (HOSPITAL_COMMUNITY)
Admission: RE | Admit: 2019-09-14 | Payer: BC Managed Care – PPO | Source: Home / Self Care | Admitting: Orthopedic Surgery

## 2019-09-14 ENCOUNTER — Encounter (HOSPITAL_COMMUNITY): Admission: RE | Payer: Self-pay | Source: Home / Self Care

## 2019-09-14 SURGERY — ARTHROPLASTY, KNEE, TOTAL
Anesthesia: Choice | Site: Knee | Laterality: Left

## 2019-11-08 DIAGNOSIS — Z955 Presence of coronary angioplasty implant and graft: Secondary | ICD-10-CM | POA: Diagnosis not present

## 2019-11-08 DIAGNOSIS — F17210 Nicotine dependence, cigarettes, uncomplicated: Secondary | ICD-10-CM | POA: Diagnosis not present

## 2019-11-08 DIAGNOSIS — R079 Chest pain, unspecified: Secondary | ICD-10-CM | POA: Diagnosis not present

## 2019-11-08 DIAGNOSIS — Z9049 Acquired absence of other specified parts of digestive tract: Secondary | ICD-10-CM | POA: Diagnosis not present

## 2019-11-08 DIAGNOSIS — R0789 Other chest pain: Secondary | ICD-10-CM | POA: Diagnosis not present

## 2019-11-08 DIAGNOSIS — R110 Nausea: Secondary | ICD-10-CM | POA: Diagnosis not present

## 2019-11-08 DIAGNOSIS — Z8673 Personal history of transient ischemic attack (TIA), and cerebral infarction without residual deficits: Secondary | ICD-10-CM | POA: Diagnosis not present

## 2019-11-08 DIAGNOSIS — I252 Old myocardial infarction: Secondary | ICD-10-CM | POA: Diagnosis not present

## 2019-11-08 DIAGNOSIS — I2510 Atherosclerotic heart disease of native coronary artery without angina pectoris: Secondary | ICD-10-CM | POA: Diagnosis not present

## 2019-11-08 DIAGNOSIS — I739 Peripheral vascular disease, unspecified: Secondary | ICD-10-CM | POA: Diagnosis not present

## 2019-11-08 DIAGNOSIS — R0600 Dyspnea, unspecified: Secondary | ICD-10-CM | POA: Diagnosis not present

## 2019-11-08 DIAGNOSIS — E669 Obesity, unspecified: Secondary | ICD-10-CM | POA: Diagnosis not present

## 2019-11-08 DIAGNOSIS — Z6833 Body mass index (BMI) 33.0-33.9, adult: Secondary | ICD-10-CM | POA: Diagnosis not present

## 2019-12-16 DIAGNOSIS — I10 Essential (primary) hypertension: Secondary | ICD-10-CM | POA: Diagnosis not present

## 2019-12-16 DIAGNOSIS — E559 Vitamin D deficiency, unspecified: Secondary | ICD-10-CM | POA: Diagnosis not present

## 2019-12-16 DIAGNOSIS — F17210 Nicotine dependence, cigarettes, uncomplicated: Secondary | ICD-10-CM | POA: Diagnosis not present

## 2019-12-16 DIAGNOSIS — E119 Type 2 diabetes mellitus without complications: Secondary | ICD-10-CM | POA: Diagnosis not present

## 2019-12-16 DIAGNOSIS — E782 Mixed hyperlipidemia: Secondary | ICD-10-CM | POA: Diagnosis not present

## 2019-12-16 DIAGNOSIS — I25118 Atherosclerotic heart disease of native coronary artery with other forms of angina pectoris: Secondary | ICD-10-CM | POA: Diagnosis not present

## 2020-01-11 DIAGNOSIS — I25118 Atherosclerotic heart disease of native coronary artery with other forms of angina pectoris: Secondary | ICD-10-CM | POA: Diagnosis not present

## 2020-01-11 DIAGNOSIS — Z1331 Encounter for screening for depression: Secondary | ICD-10-CM | POA: Diagnosis not present

## 2020-01-11 DIAGNOSIS — Z6830 Body mass index (BMI) 30.0-30.9, adult: Secondary | ICD-10-CM | POA: Diagnosis not present

## 2020-01-11 DIAGNOSIS — F17210 Nicotine dependence, cigarettes, uncomplicated: Secondary | ICD-10-CM | POA: Diagnosis not present

## 2020-01-11 DIAGNOSIS — N390 Urinary tract infection, site not specified: Secondary | ICD-10-CM | POA: Diagnosis not present

## 2020-01-11 DIAGNOSIS — Z0001 Encounter for general adult medical examination with abnormal findings: Secondary | ICD-10-CM | POA: Diagnosis not present

## 2020-01-11 DIAGNOSIS — E785 Hyperlipidemia, unspecified: Secondary | ICD-10-CM | POA: Diagnosis not present

## 2020-01-13 DIAGNOSIS — R072 Precordial pain: Secondary | ICD-10-CM | POA: Diagnosis not present

## 2020-01-13 DIAGNOSIS — I25118 Atherosclerotic heart disease of native coronary artery with other forms of angina pectoris: Secondary | ICD-10-CM | POA: Diagnosis not present

## 2020-01-13 DIAGNOSIS — E782 Mixed hyperlipidemia: Secondary | ICD-10-CM | POA: Diagnosis not present

## 2020-01-13 DIAGNOSIS — M545 Low back pain, unspecified: Secondary | ICD-10-CM | POA: Diagnosis not present

## 2020-01-13 DIAGNOSIS — I2510 Atherosclerotic heart disease of native coronary artery without angina pectoris: Secondary | ICD-10-CM | POA: Diagnosis not present

## 2020-01-13 DIAGNOSIS — I10 Essential (primary) hypertension: Secondary | ICD-10-CM | POA: Diagnosis not present

## 2020-01-18 DIAGNOSIS — I10 Essential (primary) hypertension: Secondary | ICD-10-CM | POA: Diagnosis not present

## 2020-01-18 DIAGNOSIS — E782 Mixed hyperlipidemia: Secondary | ICD-10-CM | POA: Diagnosis not present

## 2020-01-18 DIAGNOSIS — I2510 Atherosclerotic heart disease of native coronary artery without angina pectoris: Secondary | ICD-10-CM | POA: Diagnosis not present

## 2020-02-16 DIAGNOSIS — K3184 Gastroparesis: Secondary | ICD-10-CM | POA: Diagnosis not present

## 2020-02-16 DIAGNOSIS — Z9049 Acquired absence of other specified parts of digestive tract: Secondary | ICD-10-CM | POA: Diagnosis not present

## 2020-02-16 DIAGNOSIS — I25119 Atherosclerotic heart disease of native coronary artery with unspecified angina pectoris: Secondary | ICD-10-CM | POA: Diagnosis not present

## 2020-02-16 DIAGNOSIS — M1990 Unspecified osteoarthritis, unspecified site: Secondary | ICD-10-CM | POA: Diagnosis not present

## 2020-02-16 DIAGNOSIS — R0781 Pleurodynia: Secondary | ICD-10-CM | POA: Diagnosis not present

## 2020-02-16 DIAGNOSIS — I10 Essential (primary) hypertension: Secondary | ICD-10-CM | POA: Diagnosis not present

## 2020-02-16 DIAGNOSIS — I252 Old myocardial infarction: Secondary | ICD-10-CM | POA: Diagnosis not present

## 2020-02-16 DIAGNOSIS — Z6832 Body mass index (BMI) 32.0-32.9, adult: Secondary | ICD-10-CM | POA: Diagnosis not present

## 2020-02-16 DIAGNOSIS — F17210 Nicotine dependence, cigarettes, uncomplicated: Secondary | ICD-10-CM | POA: Diagnosis not present

## 2020-02-16 DIAGNOSIS — R0789 Other chest pain: Secondary | ICD-10-CM | POA: Diagnosis not present

## 2020-02-16 DIAGNOSIS — Z79899 Other long term (current) drug therapy: Secondary | ICD-10-CM | POA: Diagnosis not present

## 2020-02-16 DIAGNOSIS — Z20822 Contact with and (suspected) exposure to covid-19: Secondary | ICD-10-CM | POA: Diagnosis not present

## 2020-02-16 DIAGNOSIS — E669 Obesity, unspecified: Secondary | ICD-10-CM | POA: Diagnosis not present

## 2020-02-16 DIAGNOSIS — Z955 Presence of coronary angioplasty implant and graft: Secondary | ICD-10-CM | POA: Diagnosis not present

## 2020-02-16 DIAGNOSIS — R197 Diarrhea, unspecified: Secondary | ICD-10-CM | POA: Diagnosis not present

## 2020-02-16 DIAGNOSIS — E785 Hyperlipidemia, unspecified: Secondary | ICD-10-CM | POA: Diagnosis not present

## 2020-02-16 DIAGNOSIS — R072 Precordial pain: Secondary | ICD-10-CM | POA: Diagnosis not present

## 2020-02-16 DIAGNOSIS — R079 Chest pain, unspecified: Secondary | ICD-10-CM | POA: Diagnosis not present

## 2020-02-16 DIAGNOSIS — I200 Unstable angina: Secondary | ICD-10-CM | POA: Diagnosis not present

## 2020-02-16 DIAGNOSIS — Z9851 Tubal ligation status: Secondary | ICD-10-CM | POA: Diagnosis not present
